# Patient Record
Sex: Female | Born: 1955 | Race: White | Hispanic: No | Smoking: Never smoker
Health system: Southern US, Community
[De-identification: ages and names within clinical notes are randomized; demographics above are authoritative.]

## PROBLEM LIST (undated history)

## (undated) DIAGNOSIS — D649 Anemia, unspecified: Secondary | ICD-10-CM

## (undated) DIAGNOSIS — M2550 Pain in unspecified joint: Secondary | ICD-10-CM

## (undated) DIAGNOSIS — E559 Vitamin D deficiency, unspecified: Secondary | ICD-10-CM

## (undated) DIAGNOSIS — Z923 Personal history of irradiation: Secondary | ICD-10-CM

## (undated) DIAGNOSIS — E785 Hyperlipidemia, unspecified: Secondary | ICD-10-CM

## (undated) DIAGNOSIS — F419 Anxiety disorder, unspecified: Secondary | ICD-10-CM

## (undated) HISTORY — DX: Pain in unspecified joint: M25.50

## (undated) HISTORY — DX: Vitamin D deficiency, unspecified: E55.9

## (undated) HISTORY — PX: APPENDECTOMY: SHX54

## (undated) HISTORY — DX: Hyperlipidemia, unspecified: E78.5

## (undated) HISTORY — DX: Anemia, unspecified: D64.9

## (undated) HISTORY — PX: KIDNEY STONE SURGERY: SHX686

## (undated) HISTORY — PX: BREAST SURGERY: SHX581

---

## 2011-04-06 ENCOUNTER — Other Ambulatory Visit: Payer: Self-pay | Admitting: Obstetrics & Gynecology

## 2011-04-06 DIAGNOSIS — Z1231 Encounter for screening mammogram for malignant neoplasm of breast: Secondary | ICD-10-CM

## 2011-04-11 ENCOUNTER — Ambulatory Visit
Admission: RE | Admit: 2011-04-11 | Discharge: 2011-04-11 | Disposition: A | Discharge status: Home / Self Care | Payer: 59 | Source: Ambulatory Visit | Attending: Obstetrics & Gynecology | Admitting: Obstetrics & Gynecology

## 2011-04-11 DIAGNOSIS — Z1231 Encounter for screening mammogram for malignant neoplasm of breast: Secondary | ICD-10-CM

## 2012-05-15 ENCOUNTER — Other Ambulatory Visit: Payer: Self-pay | Admitting: Obstetrics & Gynecology

## 2012-05-15 DIAGNOSIS — Z1231 Encounter for screening mammogram for malignant neoplasm of breast: Secondary | ICD-10-CM

## 2012-05-29 ENCOUNTER — Ambulatory Visit
Admission: RE | Admit: 2012-05-29 | Discharge: 2012-05-29 | Disposition: A | Discharge status: Home / Self Care | Payer: 59 | Source: Ambulatory Visit | Attending: Obstetrics & Gynecology | Admitting: Obstetrics & Gynecology

## 2012-05-29 DIAGNOSIS — Z1231 Encounter for screening mammogram for malignant neoplasm of breast: Secondary | ICD-10-CM

## 2012-09-15 LAB — HM COLONOSCOPY: HM COLON: NORMAL

## 2013-05-28 ENCOUNTER — Other Ambulatory Visit: Payer: Self-pay | Admitting: Obstetrics & Gynecology

## 2013-05-28 DIAGNOSIS — Z1231 Encounter for screening mammogram for malignant neoplasm of breast: Secondary | ICD-10-CM

## 2013-06-10 ENCOUNTER — Ambulatory Visit
Admission: RE | Admit: 2013-06-10 | Discharge: 2013-06-10 | Disposition: A | Discharge status: Home / Self Care | Payer: 59 | Source: Ambulatory Visit | Attending: Obstetrics & Gynecology | Admitting: Obstetrics & Gynecology

## 2013-06-10 DIAGNOSIS — Z1231 Encounter for screening mammogram for malignant neoplasm of breast: Secondary | ICD-10-CM

## 2014-05-16 LAB — HM MAMMOGRAPHY: HM Mammogram: NORMAL

## 2014-07-16 LAB — HM PAP SMEAR

## 2014-07-21 ENCOUNTER — Other Ambulatory Visit: Payer: Self-pay

## 2014-07-21 DIAGNOSIS — Z1239 Encounter for other screening for malignant neoplasm of breast: Secondary | ICD-10-CM

## 2014-08-03 ENCOUNTER — Ambulatory Visit
Admission: RE | Admit: 2014-08-03 | Discharge: 2014-08-03 | Disposition: A | Discharge status: Home / Self Care | Payer: 59 | Source: Ambulatory Visit

## 2014-08-03 ENCOUNTER — Encounter (INDEPENDENT_AMBULATORY_CARE_PROVIDER_SITE_OTHER): Payer: Self-pay

## 2014-08-03 DIAGNOSIS — Z1239 Encounter for other screening for malignant neoplasm of breast: Secondary | ICD-10-CM

## 2014-08-03 LAB — HM MAMMOGRAPHY

## 2014-09-15 ENCOUNTER — Encounter: Payer: Self-pay | Admitting: Internal Medicine

## 2014-09-15 ENCOUNTER — Ambulatory Visit (INDEPENDENT_AMBULATORY_CARE_PROVIDER_SITE_OTHER): Payer: 59 | Admitting: Internal Medicine

## 2014-09-15 VITALS — BP 157/93 | HR 83 | Temp 98.4°F | Ht 65.25 in | Wt 185.2 lb

## 2014-09-15 DIAGNOSIS — IMO0001 Reserved for inherently not codable concepts without codable children: Secondary | ICD-10-CM

## 2014-09-15 DIAGNOSIS — M25512 Pain in left shoulder: Secondary | ICD-10-CM

## 2014-09-15 DIAGNOSIS — R635 Abnormal weight gain: Secondary | ICD-10-CM

## 2014-09-15 DIAGNOSIS — R030 Elevated blood-pressure reading, without diagnosis of hypertension: Secondary | ICD-10-CM | POA: Insufficient documentation

## 2014-09-15 MED ORDER — IBUPROFEN 800 MG PO TABS: 800.0000 mg | ORAL_TABLET | Freq: Three times a day (TID) | ORAL | Status: DC | PRN

## 2014-09-15 NOTE — Progress Notes (Signed)
Subjective:    Patient ID: Lisa Gallegos, female    DOB: 12-07-1955, 58 y.o.   MRN: [id number]  HPI 58YO female presents to establish care.  Weight gain - 20lb increase since menopause. Walks several times per week and has counted calories with minimal improvement. Labs including thyroid function were recently checked in 01/2014. Frustrated by weight gain and inability to lose weight.  BP - typically elevated during doctor visits, however at home 120/80s. No chest pain, headache. Never taken meds for high BP.  Left shoulder pain - lateral left shoulder pain x 2 weeks, ever since tripping over her dog and falling on outstretched arm. Takes occasional Ibuprofen. Pain seems to be improving. No weakness or numbness noted.  Review of Systems  Constitutional: Negative for fever, chills, appetite change, fatigue and unexpected weight change.  Eyes: Negative for visual disturbance.  Respiratory: Negative for shortness of breath.   Cardiovascular: Negative for chest pain and leg swelling.  Gastrointestinal: Negative for nausea, vomiting, abdominal pain, diarrhea, constipation and rectal pain.  Musculoskeletal: Positive for myalgias and arthralgias. Negative for back pain, neck pain and neck stiffness.  Skin: Negative for color change and rash.  Hematological: Negative for adenopathy. Does not bruise/bleed easily.  Psychiatric/Behavioral: Negative for suicidal ideas, sleep disturbance and dysphoric mood. The patient is not nervous/anxious.        Objective:    BP 157/93 mmHg  Pulse 83  Temp(Src) 98.4 F (36.9 C) (Oral)  Ht 5' 5.25" (1.657 m)  Wt 185 lb 4 oz (84.029 kg)  BMI 30.60 kg/m2  SpO2 97% Physical Exam  Constitutional: She is oriented to person, place, and time. She appears well-developed and well-nourished. No distress.  HENT:  Head: Normocephalic and atraumatic.  Right Ear: External ear normal.  Left Ear: External ear normal.  Nose: Nose normal.  Mouth/Throat: Oropharynx is  clear and moist. No oropharyngeal exudate.  Eyes: Conjunctivae are normal. Pupils are equal, round, and reactive to light. Right eye exhibits no discharge. Left eye exhibits no discharge. No scleral icterus.  Neck: Normal range of motion. Neck supple. No tracheal deviation present. No thyromegaly present.  Cardiovascular: Normal rate, regular rhythm, normal heart sounds and intact distal pulses.  Exam reveals no gallop and no friction rub.   No murmur heard. Pulmonary/Chest: Effort normal and breath sounds normal. No accessory muscle usage. No tachypnea. No respiratory distress. She has no decreased breath sounds. She has no wheezes. She has no rhonchi. She has no rales. She exhibits no tenderness.  Musculoskeletal: Normal range of motion. She exhibits no edema or tenderness.       Left shoulder: She exhibits pain (left lateral shoulder, mild). She exhibits normal range of motion, no tenderness, no bony tenderness, no swelling, no deformity and normal strength.  Lymphadenopathy:    She has no cervical adenopathy.  Neurological: She is alert and oriented to person, place, and time. No cranial nerve deficit. She exhibits normal muscle tone. Coordination normal.  Skin: Skin is warm and dry. No rash noted. She is not diaphoretic. No erythema. No pallor.  Psychiatric: She has a normal mood and affect. Her behavior is normal. Judgment and thought content normal.          Assessment & Plan:   Problem List Items Addressed This Visit      Unprioritized   Elevated BP    BP Readings from Last 3 Encounters:  09/15/14 157/93   BP elevated today, however pt reports better controlled at home. Will  check renal function with labs. She will monitor BP at home and email with readings. Follow up in 4 weeks.    Left shoulder pain    Left shoulder pain after recent fall. Symptoms and exam most consistent with muscular strain. Doubt rotator cuff injury given lack of weakness of abduction on exam. Symptoms  are improving. Will continue Ibuprofen 800mg  po tid prn. Continue topical Biofreeze or similar and icing of shoulder. Follow up if symptoms are not improving. If no improvement, discussed referral to sports med.    Weight gain - Primary    Body mass index is 30.6 kg/(m^2). Wt Readings from Last 3 Encounters:  09/15/14 185 lb 4 oz (84.029 kg)   Recent weight gain noted by pt. No improvement with changes in diet and exercise. Will check thyroid function with labs. Discussed Mediterranean style diet and goal exercise of 62min 3x per week. Follow up in 4 weeks.    Relevant Orders      T4, free      TSH      Hemoglobin A1c      CBC with Differential      Comprehensive metabolic panel      Lipid panel      Microalbumin / creatinine urine ratio      Vit D  25 hydroxy (rtn osteoporosis monitoring)       Return in about 4 weeks (around 10/13/2014) for Recheck.

## 2014-09-15 NOTE — Assessment & Plan Note (Signed)
Body mass index is 30.6 kg/(m^2). Wt Readings from Last 3 Encounters:  09/15/14 185 lb 4 oz (84.029 kg)   Recent weight gain noted by pt. No improvement with changes in diet and exercise. Will check thyroid function with labs. Discussed Mediterranean style diet and goal exercise of 30min 3x per week. Follow up in 4 weeks.

## 2014-09-15 NOTE — Assessment & Plan Note (Signed)
Left shoulder pain after recent fall. Symptoms and exam most consistent with muscular strain. Doubt rotator cuff injury given lack of weakness of abduction on exam. Symptoms are improving. Will continue Ibuprofen 800mg  po tid prn. Continue topical Biofreeze or similar and icing of shoulder. Follow up if symptoms are not improving. If no improvement, discussed referral to sports med.

## 2014-09-15 NOTE — Assessment & Plan Note (Signed)
BP Readings from Last 3 Encounters:  09/15/14 157/93   BP elevated today, however pt reports better controlled at home. Will check renal function with labs. She will monitor BP at home and email with readings. Follow up in 4 weeks.

## 2014-09-15 NOTE — Progress Notes (Signed)
Pre visit review using our clinic review tool, if applicable. No additional management support is needed unless otherwise documented below in the visit note. 

## 2014-09-15 NOTE — Patient Instructions (Signed)
Monitor Blood Pressure 2-3 times weekly and email with readings.  Continue Ibuprofen 800mg  up to three times daily for left shoulder pain.  Labs today.  Follow up 4 weeks.

## 2014-09-16 ENCOUNTER — Encounter: Payer: Self-pay | Admitting: Internal Medicine

## 2014-09-16 LAB — COMPREHENSIVE METABOLIC PANEL
ALBUMIN: 4.7 g/dL (ref 3.5–5.2)
ALK PHOS: 88 U/L (ref 39–117)
ALT: 26 U/L (ref 0–35)
AST: 26 U/L (ref 0–37)
BUN: 24 mg/dL — ABNORMAL HIGH (ref 6–23)
CO2: 25 meq/L (ref 19–32)
Calcium: 9.7 mg/dL (ref 8.4–10.5)
Chloride: 102 mEq/L (ref 96–112)
Creatinine, Ser: 1 mg/dL (ref 0.4–1.2)
GFR: 64.48 mL/min (ref >60.00)
Glucose, Bld: 86 mg/dL (ref 70–99)
POTASSIUM: 4 meq/L (ref 3.5–5.1)
SODIUM: 136 meq/L (ref 135–145)
TOTAL PROTEIN: 7.5 g/dL (ref 6.0–8.3)
Total Bilirubin: 0.5 mg/dL (ref 0.2–1.2)

## 2014-09-16 LAB — CBC WITH DIFFERENTIAL/PLATELET
BASOS ABS: 0.1 10*3/uL (ref 0.0–0.1)
Basophils Relative: 0.6 % (ref 0.0–3.0)
Eosinophils Absolute: 0.3 10*3/uL (ref 0.0–0.7)
Eosinophils Relative: 2.7 % (ref 0.0–5.0)
HEMATOCRIT: 41.6 % (ref 36.0–46.0)
Hemoglobin: 14 g/dL (ref 12.0–15.0)
LYMPHS ABS: 3.3 10*3/uL (ref 0.7–4.0)
Lymphocytes Relative: 35 % (ref 12.0–46.0)
MCHC: 33.5 g/dL (ref 30.0–36.0)
MCV: 87.3 fl (ref 78.0–100.0)
MONOS PCT: 6.8 % (ref 3.0–12.0)
Monocytes Absolute: 0.6 10*3/uL (ref 0.1–1.0)
NEUTROS PCT: 54.9 % (ref 43.0–77.0)
Neutro Abs: 5.1 10*3/uL (ref 1.4–7.7)
PLATELETS: 257 10*3/uL (ref 150.0–400.0)
RBC: 4.77 Mil/uL (ref 3.87–5.11)
RDW: 13.4 % (ref 11.5–15.5)
WBC: 9.3 10*3/uL (ref 4.0–10.5)

## 2014-09-16 LAB — LIPID PANEL
Cholesterol: 294 mg/dL — ABNORMAL HIGH (ref 0–200)
HDL: 50.4 mg/dL (ref >39.00)
LDL CALC: 205 mg/dL — AB (ref 0–99)
NONHDL: 243.6
Total CHOL/HDL Ratio: 6
Triglycerides: 193 mg/dL — ABNORMAL HIGH (ref 0.0–149.0)
VLDL: 38.6 mg/dL (ref 0.0–40.0)

## 2014-09-16 LAB — MICROALBUMIN / CREATININE URINE RATIO
Creatinine,U: 87.7 mg/dL
MICROALB UR: 0.7 mg/dL (ref 0.0–1.9)
Microalb Creat Ratio: 0.8 mg/g (ref 0.0–30.0)

## 2014-09-16 LAB — HEMOGLOBIN A1C: Hgb A1c MFr Bld: 5.8 % (ref 4.6–6.5)

## 2014-09-16 LAB — VITAMIN D 25 HYDROXY (VIT D DEFICIENCY, FRACTURES): VITD: 25.94 ng/mL — ABNORMAL LOW (ref 30.00–100.00)

## 2014-09-16 LAB — TSH: TSH: 1.86 u[IU]/mL (ref 0.35–4.50)

## 2014-09-16 LAB — T4, FREE: Free T4: 0.89 ng/dL (ref 0.60–1.60)

## 2014-09-24 ENCOUNTER — Other Ambulatory Visit: Payer: Self-pay | Admitting: *Deleted

## 2014-09-24 ENCOUNTER — Telehealth: Payer: Self-pay | Admitting: *Deleted

## 2014-09-24 DIAGNOSIS — E785 Hyperlipidemia, unspecified: Secondary | ICD-10-CM

## 2014-09-24 NOTE — Telephone Encounter (Signed)
Pt coming tomorrow what labs and dx? 

## 2014-09-24 NOTE — Telephone Encounter (Signed)
CMP and lipid for hyperlipidemia

## 2014-09-25 ENCOUNTER — Other Ambulatory Visit (INDEPENDENT_AMBULATORY_CARE_PROVIDER_SITE_OTHER): Payer: 59

## 2014-09-25 DIAGNOSIS — E785 Hyperlipidemia, unspecified: Secondary | ICD-10-CM

## 2014-09-25 LAB — COMPREHENSIVE METABOLIC PANEL
ALT: 21 U/L (ref 0–35)
AST: 22 U/L (ref 0–37)
Albumin: 4.3 g/dL (ref 3.5–5.2)
Alkaline Phosphatase: 87 U/L (ref 39–117)
BILIRUBIN TOTAL: 0.6 mg/dL (ref 0.2–1.2)
BUN: 21 mg/dL (ref 6–23)
CO2: 27 meq/L (ref 19–32)
CREATININE: 0.9 mg/dL (ref 0.4–1.2)
Calcium: 9.8 mg/dL (ref 8.4–10.5)
Chloride: 103 mEq/L (ref 96–112)
GFR: 73.31 mL/min (ref >60.00)
GLUCOSE: 100 mg/dL — AB (ref 70–99)
Potassium: 4.4 mEq/L (ref 3.5–5.1)
Sodium: 136 mEq/L (ref 135–145)
TOTAL PROTEIN: 7.1 g/dL (ref 6.0–8.3)

## 2014-09-25 LAB — LIPID PANEL
Cholesterol: 237 mg/dL — ABNORMAL HIGH (ref 0–200)
HDL: 52.9 mg/dL (ref >39.00)
LDL Cholesterol: 164 mg/dL — ABNORMAL HIGH (ref 0–99)
NonHDL: 184.1
Total CHOL/HDL Ratio: 4
Triglycerides: 102 mg/dL (ref 0.0–149.0)
VLDL: 20.4 mg/dL (ref 0.0–40.0)

## 2014-09-25 NOTE — Addendum Note (Signed)
Addended by: Johnsie Cancel on: 09/25/2014 08:25 AM   Modules accepted: Orders

## 2015-03-01 ENCOUNTER — Encounter: Payer: Self-pay | Admitting: Internal Medicine

## 2015-03-24 ENCOUNTER — Encounter: Payer: Self-pay | Admitting: Internal Medicine

## 2015-03-24 ENCOUNTER — Ambulatory Visit (INDEPENDENT_AMBULATORY_CARE_PROVIDER_SITE_OTHER): Payer: 59 | Admitting: Internal Medicine

## 2015-03-24 VITALS — BP 158/80 | HR 77 | Temp 97.9°F | Ht 65.25 in | Wt 181.2 lb

## 2015-03-24 DIAGNOSIS — N951 Menopausal and female climacteric states: Secondary | ICD-10-CM | POA: Insufficient documentation

## 2015-03-24 DIAGNOSIS — R635 Abnormal weight gain: Secondary | ICD-10-CM

## 2015-03-24 DIAGNOSIS — IMO0001 Reserved for inherently not codable concepts without codable children: Secondary | ICD-10-CM

## 2015-03-24 DIAGNOSIS — L821 Other seborrheic keratosis: Secondary | ICD-10-CM | POA: Insufficient documentation

## 2015-03-24 DIAGNOSIS — R030 Elevated blood-pressure reading, without diagnosis of hypertension: Secondary | ICD-10-CM

## 2015-03-24 DIAGNOSIS — E785 Hyperlipidemia, unspecified: Secondary | ICD-10-CM | POA: Diagnosis not present

## 2015-03-24 MED ORDER — IBUPROFEN 800 MG PO TABS: 800.0000 mg | ORAL_TABLET | Freq: Three times a day (TID) | ORAL | Status: DC | PRN

## 2015-03-24 NOTE — Assessment & Plan Note (Signed)
Will recheck lipids fasting next week.

## 2015-03-24 NOTE — Assessment & Plan Note (Signed)
Discussed benign nature of SK. Will monitor for any changes.

## 2015-03-24 NOTE — Assessment & Plan Note (Signed)
Wt Readings from Last 3 Encounters:  03/24/15 181 lb 4 oz (82.214 kg)  09/15/14 185 lb 4 oz (84.029 kg)   Congratulated her on weight loss. Encouraged her to add higher intensity exercise.

## 2015-03-24 NOTE — Progress Notes (Signed)
Pre visit review using our clinic review tool, if applicable. No additional management support is needed unless otherwise documented below in the visit note. 

## 2015-03-24 NOTE — Assessment & Plan Note (Signed)
BP Readings from Last 3 Encounters:  03/24/15 158/80  09/15/14 157/93   BP continues to be elevated. She prefers not to add medication. Reports BP better controlled at home. Follow up in 6 months and prn.

## 2015-03-24 NOTE — Assessment & Plan Note (Signed)
Discussed pros and cons of hormone supplementation. Given that her symptoms are mild, recommended holding off for now. Recommended more regular use of topical estrace to help with vaginal dryness and potentially hot flashes. Follow up prn.

## 2015-03-24 NOTE — Progress Notes (Signed)
Subjective:    Patient ID: Lisa Gallegos, female    DOB: 1956/07/28, 59 y.o.   MRN: [id number]  HPI  59YO female presents for acute visit.  Having occasional hot flashes. Questions if she is in menopause. Has not had a period in 2 years. Was using Estrace regularly, until recently, only rarely uses. Notes some vaginal dryness. Questions if she needs hormone replacement.  Also concerned about left ear pain over the last few days. No fever, chills, congestion. Pain comes and goes. No drainage.  Frustrated by inability to lose weight. Following a healthy diet. Does yoga. Wt Readings from Last 3 Encounters:  03/24/15 181 lb 4 oz (82.214 kg)  09/15/14 185 lb 4 oz (84.029 kg)   Also concerned about itching area on mid back. Started using Eucerin with some improvement.   Past medical, surgical, family and social history per today's encounter.  Review of Systems  Constitutional: Positive for diaphoresis. Negative for fever, chills, appetite change, fatigue and unexpected weight change.  HENT: Positive for ear pain. Negative for congestion and ear discharge.   Eyes: Negative for visual disturbance.  Respiratory: Negative for shortness of breath.   Cardiovascular: Negative for chest pain and leg swelling.  Gastrointestinal: Negative for nausea, vomiting, abdominal pain, diarrhea and constipation.  Endocrine: Positive for heat intolerance.  Genitourinary: Positive for dyspareunia. Negative for vaginal bleeding and vaginal discharge.  Musculoskeletal: Negative for myalgias and arthralgias.  Skin: Negative for color change and rash.  Hematological: Negative for adenopathy. Does not bruise/bleed easily.  Psychiatric/Behavioral: Negative for suicidal ideas and dysphoric mood. The patient is not nervous/anxious.        Objective:    BP 158/80 mmHg  Pulse 77  Temp(Src) 97.9 F (36.6 C) (Oral)  Ht 5' 5.25" (1.657 m)  Wt 181 lb 4 oz (82.214 kg)  BMI 29.94 kg/m2  SpO2 98% Physical Exam    Constitutional: She is oriented to person, place, and time. She appears well-developed and well-nourished. No distress.  HENT:  Head: Normocephalic and atraumatic.  Right Ear: External ear normal.  Left Ear: External ear normal.  Nose: Nose normal.  Mouth/Throat: Oropharynx is clear and moist. No oropharyngeal exudate.  Eyes: Conjunctivae are normal. Pupils are equal, round, and reactive to light. Right eye exhibits no discharge. Left eye exhibits no discharge. No scleral icterus.  Neck: Normal range of motion. Neck supple. No tracheal deviation present. No thyromegaly present.  Cardiovascular: Normal rate, regular rhythm, normal heart sounds and intact distal pulses.  Exam reveals no gallop and no friction rub.   No murmur heard. Pulmonary/Chest: Effort normal and breath sounds normal. No respiratory distress. She has no wheezes. She has no rales. She exhibits no tenderness.  Musculoskeletal: Normal range of motion. She exhibits no edema or tenderness.  Lymphadenopathy:    She has no cervical adenopathy.  Neurological: She is alert and oriented to person, place, and time. No cranial nerve deficit. She exhibits normal muscle tone. Coordination normal.  Skin: Skin is warm and dry. No rash noted. She is not diaphoretic. No erythema. No pallor.     Psychiatric: She has a normal mood and affect. Her behavior is normal. Judgment and thought content normal.          Assessment & Plan:   Problem List Items Addressed This Visit      Unprioritized   Elevated BP    BP Readings from Last 3 Encounters:  03/24/15 158/80  09/15/14 157/93   BP continues to be  elevated. She prefers not to add medication. Reports BP better controlled at home. Follow up in 6 months and prn.      Hyperlipidemia    Will recheck lipids fasting next week.      Relevant Orders   Comprehensive metabolic panel   Lipid panel   Menopausal symptoms - Primary    Discussed pros and cons of hormone supplementation.  Given that her symptoms are mild, recommended holding off for now. Recommended more regular use of topical estrace to help with vaginal dryness and potentially hot flashes. Follow up prn.      Seborrheic keratoses    Discussed benign nature of SK. Will monitor for any changes.      Weight gain    Wt Readings from Last 3 Encounters:  03/24/15 181 lb 4 oz (82.214 kg)  09/15/14 185 lb 4 oz (84.029 kg)   Congratulated her on weight loss. Encouraged her to add higher intensity exercise.          Return in about 6 months (around 09/23/2015) for Recheck.

## 2015-03-24 NOTE — Patient Instructions (Signed)
Labs Monday.  Consider reading the book "Always Hungry" by Dr. Isabella Stalling.  Follow up in 49months.

## 2015-03-25 ENCOUNTER — Ambulatory Visit: Payer: Self-pay | Admitting: Internal Medicine

## 2015-03-29 ENCOUNTER — Other Ambulatory Visit: Payer: Self-pay

## 2015-04-02 ENCOUNTER — Telehealth: Payer: Self-pay | Admitting: *Deleted

## 2015-04-02 ENCOUNTER — Other Ambulatory Visit (INDEPENDENT_AMBULATORY_CARE_PROVIDER_SITE_OTHER): Payer: 59

## 2015-04-02 DIAGNOSIS — E785 Hyperlipidemia, unspecified: Secondary | ICD-10-CM

## 2015-04-02 LAB — LIPID PANEL
CHOLESTEROL: 231 mg/dL — AB (ref 0–200)
HDL: 54.8 mg/dL (ref >39.00)
LDL CALC: 158 mg/dL — AB (ref 0–99)
NonHDL: 176.2
TRIGLYCERIDES: 91 mg/dL (ref 0.0–149.0)
Total CHOL/HDL Ratio: 4
VLDL: 18.2 mg/dL (ref 0.0–40.0)

## 2015-04-02 LAB — COMPREHENSIVE METABOLIC PANEL
ALK PHOS: 89 U/L (ref 39–117)
ALT: 14 U/L (ref 0–35)
AST: 17 U/L (ref 0–37)
Albumin: 4.3 g/dL (ref 3.5–5.2)
BUN: 20 mg/dL (ref 6–23)
CO2: 30 mEq/L (ref 19–32)
Calcium: 9.4 mg/dL (ref 8.4–10.5)
Chloride: 103 mEq/L (ref 96–112)
Creatinine, Ser: 0.89 mg/dL (ref 0.40–1.20)
GFR: 69.39 mL/min (ref >60.00)
Glucose, Bld: 97 mg/dL (ref 70–99)
POTASSIUM: 4.2 meq/L (ref 3.5–5.1)
SODIUM: 137 meq/L (ref 135–145)
TOTAL PROTEIN: 7.1 g/dL (ref 6.0–8.3)
Total Bilirubin: 0.4 mg/dL (ref 0.2–1.2)

## 2015-04-02 NOTE — Telephone Encounter (Signed)
Pt would like to add tsh

## 2015-04-02 NOTE — Telephone Encounter (Signed)
Fine

## 2015-04-05 NOTE — Telephone Encounter (Signed)
Called lab there wasn't enough to due add on

## 2015-05-05 ENCOUNTER — Encounter: Payer: Self-pay | Admitting: Internal Medicine

## 2015-06-07 ENCOUNTER — Telehealth: Payer: Self-pay | Admitting: Internal Medicine

## 2015-06-07 DIAGNOSIS — R5382 Chronic fatigue, unspecified: Secondary | ICD-10-CM

## 2015-06-07 NOTE — Telephone Encounter (Signed)
Orders placed.

## 2015-06-07 NOTE — Telephone Encounter (Signed)
Pt called about scheduling an appt for lab work (Thyroid and B12 levels) no orders showing. Thank You!

## 2015-06-10 ENCOUNTER — Other Ambulatory Visit (INDEPENDENT_AMBULATORY_CARE_PROVIDER_SITE_OTHER): Payer: 59

## 2015-06-10 ENCOUNTER — Telehealth: Payer: Self-pay | Admitting: *Deleted

## 2015-06-10 DIAGNOSIS — R635 Abnormal weight gain: Secondary | ICD-10-CM | POA: Diagnosis not present

## 2015-06-10 NOTE — Telephone Encounter (Signed)
Labs and dx?  

## 2015-06-10 NOTE — Telephone Encounter (Signed)
I think labs are in TSH and B12

## 2015-06-11 LAB — VITAMIN B12: VITAMIN B 12: 557 pg/mL (ref 211–911)

## 2015-06-11 LAB — TSH: TSH: 1.56 u[IU]/mL (ref 0.35–4.50)

## 2015-08-12 ENCOUNTER — Other Ambulatory Visit: Payer: Self-pay

## 2015-08-12 DIAGNOSIS — Z1231 Encounter for screening mammogram for malignant neoplasm of breast: Secondary | ICD-10-CM

## 2015-08-17 ENCOUNTER — Ambulatory Visit
Admission: RE | Admit: 2015-08-17 | Discharge: 2015-08-17 | Disposition: A | Discharge status: Home / Self Care | Payer: Managed Care, Other (non HMO) | Source: Ambulatory Visit

## 2015-08-17 DIAGNOSIS — Z1231 Encounter for screening mammogram for malignant neoplasm of breast: Secondary | ICD-10-CM

## 2015-11-29 ENCOUNTER — Encounter: Payer: Self-pay | Admitting: Internal Medicine

## 2015-11-29 ENCOUNTER — Ambulatory Visit (INDEPENDENT_AMBULATORY_CARE_PROVIDER_SITE_OTHER): Payer: Managed Care, Other (non HMO) | Admitting: Internal Medicine

## 2015-11-29 VITALS — BP 160/96 | HR 99 | Temp 101.9°F | Wt 185.0 lb

## 2015-11-29 DIAGNOSIS — J111 Influenza due to unidentified influenza virus with other respiratory manifestations: Secondary | ICD-10-CM

## 2015-11-29 DIAGNOSIS — R509 Fever, unspecified: Secondary | ICD-10-CM | POA: Diagnosis not present

## 2015-11-29 LAB — POCT INFLUENZA A/B
INFLUENZA A, POC: NEGATIVE
INFLUENZA B, POC: NEGATIVE

## 2015-11-29 MED ORDER — OSELTAMIVIR PHOSPHATE 75 MG PO CAPS: 75.0000 mg | ORAL_CAPSULE | Freq: Two times a day (BID) | ORAL | Status: DC

## 2015-11-29 NOTE — Progress Notes (Signed)
Subjective:    Patient ID: Lisa Gallegos, female    DOB: 1955-10-19, 60 y.o.   MRN: [id number]  HPI  59YO female presents for acute visit.  Several relatives with flu, including daughter in law and 67 month old grandchild. Nasal congestion, muscle aches, feels "spacey." Fever at home. Chills. Occasional dry cough. Took a decongestant this morning with minimal improvement.   Wt Readings from Last 3 Encounters:  11/29/15 185 lb (83.915 kg)  03/24/15 181 lb 4 oz (82.214 kg)  09/15/14 185 lb 4 oz (84.029 kg)   BP Readings from Last 3 Encounters:  11/29/15 160/96  03/24/15 158/80  09/15/14 157/93    No past medical history on file. Family History  Problem Relation Age of Onset  . Cancer Mother     Pancreatic cancer   . Heart disease Father    Past Surgical History  Procedure Laterality Date  . Kidney stone surgery      laser surgery  . Cesarean section  6    FTP   Social History   Social History  . Marital Status: Single    Spouse Name: N/A  . Number of Children: N/A  . Years of Education: N/A   Social History Main Topics  . Smoking status: Never Smoker   . Smokeless tobacco: None  . Alcohol Use: 0.0 oz/week    0 Standard drinks or equivalent per week     Comment: couple drinks a week   . Drug Use: No  . Sexual Activity: Not Asked   Other Topics Concern  . None   Social History Narrative   Lives in [place] with fiance. 2 dogs and 1 cat in home.      Work - Community education officer.      Diet - chicken and fish with veggies, avoids red meat          Review of Systems  Constitutional: Positive for fever, chills and fatigue. Negative for unexpected weight change.  HENT: Positive for congestion, postnasal drip, rhinorrhea and sore throat. Negative for ear discharge, ear pain, facial swelling, hearing loss, mouth sores, nosebleeds, sinus pressure, sneezing, tinnitus, trouble swallowing and voice change.   Eyes: Negative for pain, discharge, redness and visual  disturbance.  Respiratory: Positive for cough. Negative for chest tightness, shortness of breath, wheezing and stridor.   Cardiovascular: Negative for chest pain, palpitations and leg swelling.  Gastrointestinal: Negative for nausea, vomiting, diarrhea and constipation.  Musculoskeletal: Positive for myalgias. Negative for arthralgias, neck pain and neck stiffness.  Skin: Negative for color change and rash.  Neurological: Negative for dizziness, weakness, light-headedness and headaches.  Hematological: Negative for adenopathy.       Objective:    BP 160/96 mmHg  Pulse 99  Temp(Src) 101.9 F (38.8 C) (Oral)  Wt 185 lb (83.915 kg)  SpO2 97% Physical Exam  Constitutional: She is oriented to person, place, and time. She appears well-developed and well-nourished. No distress.  HENT:  Head: Normocephalic and atraumatic.  Right Ear: External ear normal.  Left Ear: External ear normal.  Nose: Nose normal.  Mouth/Throat: Oropharynx is clear and moist. No oropharyngeal exudate.  Eyes: Conjunctivae are normal. Pupils are equal, round, and reactive to light. Right eye exhibits no discharge. Left eye exhibits no discharge. No scleral icterus.  Neck: Normal range of motion. Neck supple. No tracheal deviation present. No thyromegaly present.  Cardiovascular: Normal rate, regular rhythm, normal heart sounds and intact distal pulses.  Exam reveals no gallop and no friction rub.  No murmur heard. Pulmonary/Chest: Effort normal and breath sounds normal. No accessory muscle usage. No tachypnea. No respiratory distress. She has no decreased breath sounds. She has no wheezes. She has no rhonchi. She has no rales. She exhibits no tenderness.  Musculoskeletal: Normal range of motion. She exhibits no edema or tenderness.  Lymphadenopathy:    She has no cervical adenopathy.  Neurological: She is alert and oriented to person, place, and time. No cranial nerve deficit. She exhibits normal muscle tone.  Coordination normal.  Skin: Skin is warm and dry. No rash noted. She is not diaphoretic. No erythema. No pallor.  Psychiatric: She has a normal mood and affect. Her behavior is normal. Judgment and thought content normal.          Assessment & Plan:   Problem List Items Addressed This Visit      Unprioritized   Influenza with respiratory manifestation - Primary    Symptoms and exam c/w influenza. Start Tamiflu bid x5 days. Rest, increase fluids. Follow up if symptoms not improving.      Relevant Medications   oseltamivir (TAMIFLU) 75 MG capsule       Return if symptoms worsen or fail to improve.

## 2015-11-29 NOTE — Addendum Note (Signed)
Addended by: Vernetta Honey on: 11/29/2015 01:29 PM   Modules accepted: Orders

## 2015-11-29 NOTE — Patient Instructions (Signed)

## 2015-11-29 NOTE — Progress Notes (Signed)
Pre visit review using our clinic review tool, if applicable. No additional management support is needed unless otherwise documented below in the visit note. 

## 2015-11-29 NOTE — Assessment & Plan Note (Signed)
Symptoms and exam c/w influenza. Start Tamiflu bid x5 days. Rest, increase fluids. Follow up if symptoms not improving.

## 2015-11-30 ENCOUNTER — Encounter: Payer: Self-pay | Admitting: Internal Medicine

## 2016-01-11 ENCOUNTER — Encounter: Payer: Self-pay | Admitting: Internal Medicine

## 2016-01-12 ENCOUNTER — Encounter: Payer: Self-pay | Admitting: Family Medicine

## 2016-01-12 ENCOUNTER — Ambulatory Visit (INDEPENDENT_AMBULATORY_CARE_PROVIDER_SITE_OTHER): Payer: Managed Care, Other (non HMO) | Admitting: Family Medicine

## 2016-01-12 VITALS — BP 136/88 | HR 83 | Temp 98.4°F | Ht 65.25 in | Wt 183.6 lb

## 2016-01-12 DIAGNOSIS — T148 Other injury of unspecified body region: Secondary | ICD-10-CM | POA: Diagnosis not present

## 2016-01-12 DIAGNOSIS — W57XXXA Bitten or stung by nonvenomous insect and other nonvenomous arthropods, initial encounter: Secondary | ICD-10-CM | POA: Diagnosis not present

## 2016-01-12 MED ORDER — DOXYCYCLINE HYCLATE 100 MG PO TABS: 200.0000 mg | ORAL_TABLET | Freq: Once | ORAL | Status: DC

## 2016-01-12 NOTE — Assessment & Plan Note (Signed)
Patient with recent tick bite with possible exposure for greater than 48 hours. Discussed potential for Lyme disease in our area as we're in the [place] and given her duration of possible tick exposure. Discussed potential prophylaxis against Lyme disease with doxycycline as a single dose. Discussed that per up-to-date there is no role for prophylaxis for RMSF given the low percentage of ticks that carry this disease. We will treat her with a prophylactic dose of doxycycline to help prevent Lyme disease. She will monitor for signs and symptoms of RMSF and seek medical attention immediately if they occur. Given return precautions.

## 2016-01-12 NOTE — Progress Notes (Signed)
Patient ID: Lisa Gallegos, female   DOB: 1956/08/11, 60 y.o.   MRN: [id number]  Tommi Rumps, MD Phone: [phone]  Lisa Gallegos is a 60 y.o. female who presents today for same-day visit.  Tick bite: Patient notes found to call on her left hip yesterday. She believes it may have been in place since Sunday when she was in a field taking pictures with her children. Notes she pulled it off. She's not had any rash. Notes it looks just like a bug bite now. Does not itch. She has no joint aches or fevers. She feels well overall. She is worried she might develop Lyme disease or [facility] spotted fever.  PMH: nonsmoker.   ROS see history of present illness  Objective  Physical Exam Filed Vitals:   01/12/16 1359  BP: 136/88  Pulse: 83  Temp: 98.4 F (36.9 C)    BP Readings from Last 3 Encounters:  01/12/16 136/88  11/29/15 160/96  03/24/15 158/80   Wt Readings from Last 3 Encounters:  01/12/16 183 lb 9.6 oz (83.28 kg)  11/29/15 185 lb (83.915 kg)  03/24/15 181 lb 4 oz (82.214 kg)    Physical Exam  Constitutional: She is well-developed, well-nourished, and in no distress.  Cardiovascular: Normal rate, regular rhythm and normal heart sounds.   Pulmonary/Chest: Effort normal and breath sounds normal.  Skin: Skin is warm. She is not diaphoretic.     No other rash noted     Assessment/Plan: Please see individual problem list.  Tick bite Patient with recent tick bite with possible exposure for greater than 48 hours. Discussed potential for Lyme disease in our area as we're in the [place] and given her duration of possible tick exposure. Discussed potential prophylaxis against Lyme disease with doxycycline as a single dose. Discussed that per up-to-date there is no role for prophylaxis for RMSF given the low percentage of ticks that carry this disease. We will treat her with a prophylactic dose of doxycycline to help prevent Lyme disease. She will monitor for signs  and symptoms of RMSF and seek medical attention immediately if they occur. Given return precautions.    No orders of the defined types were placed in this encounter.    Meds ordered this encounter  Medications  . DISCONTD: doxycycline (VIBRA-TABS) 100 MG tablet    Sig: Take 2 tablets (200 mg total) by mouth once.    Dispense:  2 tablet    Refill:  0  . doxycycline (VIBRA-TABS) 100 MG tablet    Sig: Take 2 tablets (200 mg total) by mouth once.    Dispense:  2 tablet    Refill:  0   Tommi Rumps, MD [place]

## 2016-01-12 NOTE — Patient Instructions (Signed)
Nice to meet you. We will treat you prophylactically for Lyme disease given the duration of tick exposure. You'll take doxycycline 200 mg once. If you develop fever, rash, headache, malaise, muscle aches, joint aches, nausea, vomiting, abdominal pain, confusion, seizures, or any new or changing symptoms please seek medical attention.

## 2016-01-12 NOTE — Progress Notes (Signed)
Pre visit review using our clinic review tool, if applicable. No additional management support is needed unless otherwise documented below in the visit note. 

## 2016-06-12 ENCOUNTER — Encounter: Payer: Self-pay | Admitting: Family

## 2016-06-12 ENCOUNTER — Ambulatory Visit (INDEPENDENT_AMBULATORY_CARE_PROVIDER_SITE_OTHER): Payer: Managed Care, Other (non HMO) | Admitting: Family

## 2016-06-12 VITALS — BP 138/82 | HR 78 | Temp 98.2°F | Wt 179.0 lb

## 2016-06-12 DIAGNOSIS — R21 Rash and other nonspecific skin eruption: Secondary | ICD-10-CM

## 2016-06-12 MED ORDER — MUPIROCIN 2 % EX OINT: 1.0000 "application " | TOPICAL_OINTMENT | Freq: Two times a day (BID) | CUTANEOUS | 0 refills | Status: DC

## 2016-06-12 NOTE — Patient Instructions (Addendum)
My pleasure meeting you today. Please schedule a visit for physical in the next couple months as well as to establish care.   Suspect a small boil in groin- next time apply antibiotic cream to prevent it from getting big and scarring.

## 2016-06-12 NOTE — Progress Notes (Signed)
Pre visit review using our clinic review tool, if applicable. No additional management support is needed unless otherwise documented below in the visit note. 

## 2016-06-12 NOTE — Progress Notes (Signed)
Subjective:    Patient ID: Lisa Gallegos, female    DOB: 1956-05-24, 60 y.o.   MRN: [id number]  CC: Lisa Gallegos is a 59 y.o. female who presents today for an acute visit.    HPI: Patient is here for assurance after picking at an ingrown hair after shaving in left groin over past few months, which is now 'bruised looking'. Unchanged. Fever, chills, lymphadenopathy, discharge from lesion. No vaginal pain. No h/o MRSA or cellulitis.  Follows with [facility] and has appointment for Pap smear and mammogram due this October 2017.  She will make CPE, establish care appointment for this fall as well with me.     HISTORY:  History reviewed. No pertinent past medical history. Past Surgical History:  Procedure Laterality Date  . CESAREAN SECTION  1985   FTP  . KIDNEY STONE SURGERY     laser surgery   Family History  Problem Relation Age of Onset  . Cancer Mother     Pancreatic cancer   . Heart disease Father     Allergies: Review of patient's allergies indicates no known allergies. Current Outpatient Prescriptions on File Prior to Visit  Medication Sig Dispense Refill  . Cholecalciferol (VITAMIN D-3 PO) Take by mouth.    . Coenzyme Q10 (COQ10 PO) Take by mouth.    Adora Fridge VAGINAL 0.1 MG/GM vaginal cream   11  . ibuprofen (ADVIL,MOTRIN) 800 MG tablet Take 1 tablet (800 mg total) by mouth every 8 (eight) hours as needed. 90 tablet 0  . MAGNESIUM PO Take by mouth.    . Multiple Vitamins-Minerals (MULTIVITAMIN PO) Take by mouth.     No current facility-administered medications on file prior to visit.     Social History  Substance Use Topics  . Smoking status: Never Smoker  . Smokeless tobacco: Never Used  . Alcohol use 0.0 oz/week     Comment: couple drinks a week     Review of Systems  Constitutional: Negative for chills and fever.  Respiratory: Negative for cough.   Cardiovascular: Negative for chest pain and palpitations.  Gastrointestinal: Negative for nausea and  vomiting.  Skin: Positive for rash.      Objective:    BP 138/82   Pulse 78   Temp 98.2 F (36.8 C) (Oral)   Wt 179 lb (81.2 kg)   SpO2 98%   BMI 29.56 kg/m    Physical Exam  Constitutional: She appears well-developed and well-nourished.  Eyes: Conjunctivae are normal.  Cardiovascular: Normal rate, regular rhythm, normal heart sounds and normal pulses.   Pulmonary/Chest: Effort normal and breath sounds normal. She has no wheezes. She has no rhonchi. She has no rales.  Neurological: She is alert.  Skin: Skin is warm and dry. Lesion noted.     Psychiatric: She has a normal mood and affect. Her speech is normal and behavior is normal. Thought content normal.  Vitals reviewed.      Assessment & Plan:  1. Rash and nonspecific skin eruption Lesion appears as hyperpigmented scar. No signs of acute infection. Gave patient topical antibiotic if another in grown hair presents to prevent future infection,scarring.  - mupirocin ointment (BACTROBAN) 2 %; Place 1 application into the nose 2 (two) times daily.  Dispense: 22 g; Refill: 0     I have discontinued [name]'s oseltamivir and doxycycline. I am also having her start on mupirocin ointment. Additionally, I am having her maintain her ESTRACE VAGINAL, Multiple Vitamins-Minerals (MULTIVITAMIN PO), MAGNESIUM PO, Coenzyme  Q10 (COQ10 PO), Cholecalciferol (VITAMIN D-3 PO), and ibuprofen.   Meds ordered this encounter  Medications  . mupirocin ointment (BACTROBAN) 2 %    Sig: Place 1 application into the nose 2 (two) times daily.    Dispense:  22 g    Refill:  0    Order Specific Question:   Supervising Provider    Answer:   Crecencio Mc [[id number]]    Return precautions given.   Risks, benefits, and alternatives of the medications and treatment plan prescribed today were discussed, and patient expressed understanding.   Education regarding symptom management and diagnosis given to patient on AVS.  Continue to follow with  Rica Mast, MD for routine health maintenance.   Lisa Gallegos and I agreed with plan.   Mable Paris, FNP

## 2016-08-14 ENCOUNTER — Other Ambulatory Visit: Payer: Self-pay | Admitting: Obstetrics & Gynecology

## 2016-08-14 DIAGNOSIS — Z1231 Encounter for screening mammogram for malignant neoplasm of breast: Secondary | ICD-10-CM

## 2016-09-11 ENCOUNTER — Ambulatory Visit
Admission: RE | Admit: 2016-09-11 | Discharge: 2016-09-11 | Disposition: A | Discharge status: Home / Self Care | Payer: Managed Care, Other (non HMO) | Source: Ambulatory Visit | Attending: Obstetrics & Gynecology | Admitting: Obstetrics & Gynecology

## 2016-09-11 DIAGNOSIS — Z1231 Encounter for screening mammogram for malignant neoplasm of breast: Secondary | ICD-10-CM

## 2016-10-24 DIAGNOSIS — H5203 Hypermetropia, bilateral: Secondary | ICD-10-CM | POA: Diagnosis not present

## 2016-10-24 DIAGNOSIS — H40053 Ocular hypertension, bilateral: Secondary | ICD-10-CM | POA: Diagnosis not present

## 2016-10-24 DIAGNOSIS — H2513 Age-related nuclear cataract, bilateral: Secondary | ICD-10-CM | POA: Diagnosis not present

## 2016-11-06 DIAGNOSIS — D229 Melanocytic nevi, unspecified: Secondary | ICD-10-CM | POA: Diagnosis not present

## 2016-11-06 DIAGNOSIS — D492 Neoplasm of unspecified behavior of bone, soft tissue, and skin: Secondary | ICD-10-CM | POA: Diagnosis not present

## 2016-11-06 DIAGNOSIS — L821 Other seborrheic keratosis: Secondary | ICD-10-CM | POA: Diagnosis not present

## 2016-11-06 DIAGNOSIS — D224 Melanocytic nevi of scalp and neck: Secondary | ICD-10-CM | POA: Diagnosis not present

## 2016-11-06 DIAGNOSIS — L820 Inflamed seborrheic keratosis: Secondary | ICD-10-CM | POA: Diagnosis not present

## 2016-11-29 DIAGNOSIS — F4322 Adjustment disorder with anxiety: Secondary | ICD-10-CM | POA: Diagnosis not present

## 2016-12-13 DIAGNOSIS — F4322 Adjustment disorder with anxiety: Secondary | ICD-10-CM | POA: Diagnosis not present

## 2016-12-27 ENCOUNTER — Ambulatory Visit (INDEPENDENT_AMBULATORY_CARE_PROVIDER_SITE_OTHER): Payer: BLUE CROSS/BLUE SHIELD | Admitting: Family

## 2016-12-27 ENCOUNTER — Encounter: Payer: Self-pay | Admitting: Family

## 2016-12-27 VITALS — BP 124/78 | HR 75 | Temp 98.7°F | Ht 65.25 in | Wt 183.0 lb

## 2016-12-27 DIAGNOSIS — F4322 Adjustment disorder with anxiety: Secondary | ICD-10-CM | POA: Diagnosis not present

## 2016-12-27 DIAGNOSIS — Z0000 Encounter for general adult medical examination without abnormal findings: Secondary | ICD-10-CM | POA: Diagnosis not present

## 2016-12-27 NOTE — Progress Notes (Signed)
Subjective:    Patient ID: Lisa Gallegos, female    DOB: [birth year], 61 y.o.   MRN: [id number]  CC: Lisa Gallegos is a 61 y.o. female who presents today for physical exam.  HPI: Feeling well, no complaints.   Father had cardiovascular disease and in doing some reading, she would like to have lipoprotein a included in her lab work today. She had a history of elevated cholesterol and has declined cholesterol medication the past.  Follows with Dr Lisbeth Renshaw, GYN for pap, mammogram. States last pap normal and UTD; no records of this. She has a breast exam with GYN.   Walking and  goes to [employer].  Mammogram and colonoscopy UTD- no polpys.  Nonsmoker Follows with dermatology and annual skin check 10/2016.             HISTORY:  History reviewed. No pertinent past medical history. Past Surgical History:  Procedure Laterality Date  . CESAREAN SECTION  1985   FTP  . KIDNEY STONE SURGERY     laser surgery   Family History  Problem Relation Age of Onset  . Cancer Mother     Pancreatic cancer   . Heart disease Father     Allergies: Patient has no known allergies. Current Outpatient Prescriptions on File Prior to Visit  Medication Sig Dispense Refill  . Cholecalciferol (VITAMIN D-3 PO) Take by mouth.    . Coenzyme Q10 (COQ10 PO) Take by mouth.    Adora Fridge VAGINAL 0.1 MG/GM vaginal cream   11  . ibuprofen (ADVIL,MOTRIN) 800 MG tablet Take 1 tablet (800 mg total) by mouth every 8 (eight) hours as needed. 90 tablet 0  . MAGNESIUM PO Take by mouth.    . Multiple Vitamins-Minerals (MULTIVITAMIN PO) Take by mouth.    . mupirocin ointment (BACTROBAN) 2 % Place 1 application into the nose 2 (two) times daily. 22 g 0   No current facility-administered medications on file prior to visit.     Social History  Substance Use Topics  . Smoking status: Never Smoker  . Smokeless tobacco: Never Used  . Alcohol use 0.0 oz/week     Comment: couple drinks a week     Review of Systems    Constitutional: Negative for chills, fever and unexpected weight change.  HENT: Negative for congestion.   Respiratory: Negative for cough.   Cardiovascular: Negative for chest pain, palpitations and leg swelling.  Gastrointestinal: Negative for nausea and vomiting.  Musculoskeletal: Negative for arthralgias and myalgias.  Skin: Negative for rash.  Neurological: Negative for headaches.  Hematological: Negative for adenopathy.  Psychiatric/Behavioral: Negative for confusion.      Objective:    BP 124/78   Pulse 75   Temp 98.7 F (37.1 C) (Oral)   Ht 5' 5.25" (1.657 m)   Wt 183 lb (83 kg)   SpO2 97%   BMI 30.22 kg/m  BP Readings from Last 3 Encounters:  12/27/16 124/78  06/12/16 138/82  01/12/16 136/88   Wt Readings from Last 3 Encounters:  12/27/16 183 lb (83 kg)  06/12/16 179 lb (81.2 kg)  01/12/16 183 lb 9.6 oz (83.3 kg)    Physical Exam  Constitutional: She appears well-developed and well-nourished.  Eyes: Conjunctivae are normal.  Neck: No thyroid mass and no thyromegaly present.  Cardiovascular: Normal rate, regular rhythm, normal heart sounds and normal pulses.   Pulmonary/Chest: Effort normal and breath sounds normal. She has no wheezes. She has no rhonchi. She has no rales.  Lymphadenopathy:  Head (right side): No submental, no submandibular, no tonsillar, no preauricular, no posterior auricular and no occipital adenopathy present.       Head (left side): No submental, no submandibular, no tonsillar, no preauricular, no posterior auricular and no occipital adenopathy present.    She has no cervical adenopathy.  Neurological: She is alert.  Skin: Skin is warm and dry.  Psychiatric: She has a normal mood and affect. Her speech is normal and behavior is normal. Thought content normal.  Vitals reviewed.      Assessment & Plan:   Problem List Items Addressed This Visit      Other   Routine physical examination - Primary    Patient follows with GYN,  Dr. Lisbeth Renshaw, and politely declines having a breast exam or pelvic exam today. Screening labs ordered and consented for HIV and hepatitis C. Additionally, added in lipoprotein a which patient requested. We discussed lab and patient is unsure even if elevated if she would consider cholesterol medication. She would just like to know with her family history. Mammogram and Pap smear up-to-date. Colonoscopy is up-to-date. Nonsmoker. Immunizations up-to-date. Patient does not take the flu vaccine. Encouraged continued exercise.      Relevant Orders   CBC with Differential/Platelet   Comprehensive metabolic panel   Hemoglobin A1c   Hepatitis C antibody   HIV antibody   Lipid panel   TSH   VITAMIN D 25 Hydroxy (Vit-D Deficiency, Fractures)   Lipoprotein A (LPA)       I am having Ms. Nordgren maintain her ESTRACE VAGINAL, Multiple Vitamins-Minerals (MULTIVITAMIN PO), MAGNESIUM PO, Coenzyme Q10 (COQ10 PO), Cholecalciferol (VITAMIN D-3 PO), ibuprofen, and mupirocin ointment.   No orders of the defined types were placed in this encounter.   Return precautions given.   Risks, benefits, and alternatives of the medications and treatment plan prescribed today were discussed, and patient expressed understanding.   Education regarding symptom management and diagnosis given to patient on AVS.  Continue to follow with Mable Paris, FNP for routine health maintenance.   Lisa Gallegos and I agreed with plan.   Mable Paris, FNP

## 2016-12-27 NOTE — Patient Instructions (Signed)
Fasting Lab appt.    This is  Dr. Lupita Dawn  example of a  "Low GI"  Diet:  It will allow you to lose 4 to 8  lbs  per month if you follow it carefully.  Your goal with exercise is a minimum of 30 minutes of aerobic exercise 5 days per week (Walking does not count once it becomes easy!)    All of the foods can be found at grocery stores and in bulk at [employer].  The Atkins protein bars and shakes are available in more varieties at [employer], [employer] and [place].     7 AM Breakfast:  Choose from the following:  Low carbohydrate Protein  Shakes (I recommend the  Premier Protein chocolate shakes,  EAS AdvantEdge "Carb Control" shakes  Or the Atkins shakes all are under 3 net carbs)     a scrambled egg/bacon/cheese burrito made with Mission's "carb balance" whole wheat tortilla  (about 10 net carbs )  Regulatory affairs officer (basically a quiche without the pastry crust) that is eaten cold and very convenient way to get your eggs.  8 carbs)  If you make your own protein shakes, avoid bananas and pineapple,  And use low carb greek yogurt or original /unsweetened almond or soy milk    Avoid cereal and bananas, oatmeal and cream of wheat and grits. They are loaded with carbohydrates!   10 AM: high protein snack:  Protein bar by Atkins (the snack size, under 200 cal, usually < 6 net carbs).    A stick of cheese:  Around 1 carb,  100 cal     Dannon Light n Fit [place] Yogurt  (80 cal, 8 carbs)  Other so called "protein bars" and Greek yogurts tend to be loaded with carbohydrates.  Remember, in food advertising, the word "energy" is synonymous for " carbohydrate."  Lunch:   A Sandwich using the bread choices listed, Can use any  Eggs,  lunchmeat, grilled meat or canned tuna), avocado, regular mayo/mustard  and cheese.  A Salad using blue cheese, ranch,  Goddess or vinagrette,  Avoid taco shells, croutons or "confetti" and no "candied nuts" but regular nuts OK.   No pretzels, nabs  or  chips.  Pickles and miniature sweet peppers are a good low carb alternative that provide a "crunch"  The bread is the only source of carbohydrate in a sandwich and  can be decreased by trying some of the attached alternatives to traditional loaf bread   Avoid "Low fat dressings, as well as [place] dressings They are loaded with sugar!   3 PM/ Mid day  Snack:  Consider  1 ounce of  almonds, walnuts, pistachios, pecans, peanuts,  Macadamia nuts or a nut medley.  Avoid "granola and granola bars "  Mixed nuts are ok in moderation as long as there are no raisins,  cranberries or dried fruit.   KIND bars are OK if you get the low glycemic index variety   Try the prosciutto/mozzarella cheese sticks by [employer]  In deli /backery section   High protein      6 PM  Dinner:     Meat/fowl/fish with a green salad, and either broccoli, cauliflower, green beans, spinach, brussel sprouts or  Lima beans. DO NOT BREAD THE PROTEIN!!      There is a low carb pasta by Dreamfield's that is acceptable and tastes great: only 5 digestible carbs/serving.( All grocery stores but BJs carry it )  Several ready made meals are available low carb:   Try Michel Angelo's chicken piccata or chicken or eggplant parm over low carb pasta.(Lowes and BJs)   Marjory Lies Sanchez's "Carnitas" (pulled pork, no sauce,  0 carbs) or his beef pot roast to make a dinner burrito (at [employer])  Pesto over low carb pasta (bj's sells a good quality pesto in the center refrigerated section of the deli   Try satueeing  Cheral Marker with mushroooms as a good side   [employer] makes a mashed cauliflower that tastes like mashed potatoes  Whole wheat pasta is still full of digestible carbs and  Not as low in glycemic index as Dreamfield's.   Brown rice is still rice,  So skip the rice and noodles if you eat [place] or [place] (or at least limit to 1/2 cup)  9 PM snack :   Breyer's "low carb" fudgsicle or  ice cream bar (Carb Smart line), or   Weight Watcher's ice cream bar , or another "no sugar added" ice cream;  a serving of fresh berries/cherries with whipped cream   Cheese or DANNON'S LlGHT N FIT GREEK YOGURT  8 ounces of [place] unsweetened almond/cococunut milk    Treat yourself to a parfait made with whipped cream blueberiies, walnuts and vanilla greek yogurt  Avoid bananas, pineapple, grapes  and watermelon on a regular basis because they are high in sugar.  THINK OF THEM AS DESSERT  Remember that snack Substitutions should be less than 10 NET carbs per serving and meals < 20 carbs. Remember to subtract fiber grams to get the "net carbs."  '@TULLOBREADPACKAGE' @   Health Maintenance for Postmenopausal Women Menopause is a normal process in which your reproductive ability comes to an end. This process happens gradually over a span of months to years, usually between the ages of 47 and 92. Menopause is complete when you have missed 12 consecutive menstrual periods. It is important to talk with your health care provider about some of the most common conditions that affect postmenopausal women, such as heart disease, cancer, and bone loss (osteoporosis). Adopting a healthy lifestyle and getting preventive care can help to promote your health and wellness. Those actions can also lower your chances of developing some of these common conditions. What should I know about menopause? During menopause, you may experience a number of symptoms, such as:  Moderate-to-severe hot flashes.  Night sweats.  Decrease in sex drive.  Mood swings.  Headaches.  Tiredness.  Irritability.  Memory problems.  Insomnia. Choosing to treat or not to treat menopausal changes is an individual decision that you make with your health care provider. What should I know about hormone replacement therapy and supplements? Hormone therapy products are effective for treating symptoms that are associated with menopause, such as hot flashes and night  sweats. Hormone replacement carries certain risks, especially as you become older. If you are thinking about using estrogen or estrogen with progestin treatments, discuss the benefits and risks with your health care provider. What should I know about heart disease and stroke? Heart disease, heart attack, and stroke become more likely as you age. This may be due, in part, to the hormonal changes that your body experiences during menopause. These can affect how your body processes dietary fats, triglycerides, and cholesterol. Heart attack and stroke are both medical emergencies. There are many things that you can do to help prevent heart disease and stroke:  Have your blood pressure checked at least every 1-2 years.  High blood pressure causes heart disease and increases the risk of stroke.  If you are 73-13 years old, ask your health care provider if you should take aspirin to prevent a heart attack or a stroke.  Do not use any tobacco products, including cigarettes, chewing tobacco, or electronic cigarettes. If you need help quitting, ask your health care provider.  It is important to eat a healthy diet and maintain a healthy weight.  Be sure to include plenty of vegetables, fruits, low-fat dairy products, and lean protein.  Avoid eating foods that are high in solid fats, added sugars, or salt (sodium).  Get regular exercise. This is one of the most important things that you can do for your health.  Try to exercise for at least 150 minutes each week. The type of exercise that you do should increase your heart rate and make you sweat. This is known as moderate-intensity exercise.  Try to do strengthening exercises at least twice each week. Do these in addition to the moderate-intensity exercise.  Know your numbers.Ask your health care provider to check your cholesterol and your blood glucose. Continue to have your blood tested as directed by your health care provider. What should I know about  cancer screening? There are several types of cancer. Take the following steps to reduce your risk and to catch any cancer development as early as possible. Breast Cancer  Practice breast self-awareness.  This means understanding how your breasts normally appear and feel.  It also means doing regular breast self-exams. Let your health care provider know about any changes, no matter how small.  If you are 58 or older, have a clinician do a breast exam (clinical breast exam or CBE) every year. Depending on your age, family history, and medical history, it may be recommended that you also have a yearly breast X-ray (mammogram).  If you have a family history of breast cancer, talk with your health care provider about genetic screening.  If you are at high risk for breast cancer, talk with your health care provider about having an MRI and a mammogram every year.  Breast cancer (BRCA) gene test is recommended for women who have family members with BRCA-related cancers. Results of the assessment will determine the need for genetic counseling and BRCA1 and for BRCA2 testing. BRCA-related cancers include these types:  Breast. This occurs in males or females.  Ovarian.  Tubal. This may also be called fallopian tube cancer.  Cancer of the abdominal or pelvic lining (peritoneal cancer).  Prostate.  Pancreatic. Cervical, Uterine, and Ovarian Cancer  Your health care provider may recommend that you be screened regularly for cancer of the pelvic organs. These include your ovaries, uterus, and vagina. This screening involves a pelvic exam, which includes checking for microscopic changes to the surface of your cervix (Pap test).  For women ages 21-65, health care providers may recommend a pelvic exam and a Pap test every three years. For women ages 38-65, they may recommend the Pap test and pelvic exam, combined with testing for human papilloma virus (HPV), every five years. Some types of HPV increase  your risk of cervical cancer. Testing for HPV may also be done on women of any age who have unclear Pap test results.  Other health care providers may not recommend any screening for nonpregnant women who are considered low risk for pelvic cancer and have no symptoms. Ask your health care provider if a screening pelvic exam is right for you.  If you  have had past treatment for cervical cancer or a condition that could lead to cancer, you need Pap tests and screening for cancer for at least 20 years after your treatment. If Pap tests have been discontinued for you, your risk factors (such as having a new sexual partner) need to be reassessed to determine if you should start having screenings again. Some women have medical problems that increase the chance of getting cervical cancer. In these cases, your health care provider may recommend that you have screening and Pap tests more often.  If you have a family history of uterine cancer or ovarian cancer, talk with your health care provider about genetic screening.  If you have vaginal bleeding after reaching menopause, tell your health care provider.  There are currently no reliable tests available to screen for ovarian cancer. Lung Cancer  Lung cancer screening is recommended for adults 93-67 years old who are at high risk for lung cancer because of a history of smoking. A yearly low-dose CT scan of the lungs is recommended if you:  Currently smoke.  Have a history of at least 30 pack-years of smoking and you currently smoke or have quit within the past 15 years. A pack-year is smoking an average of one pack of cigarettes per day for one year. Yearly screening should:  Continue until it has been 15 years since you quit.  Stop if you develop a health problem that would prevent you from having lung cancer treatment. Colorectal Cancer  This type of cancer can be detected and can often be prevented.  Routine colorectal cancer screening usually  begins at age 74 and continues through age 66.  If you have risk factors for colon cancer, your health care provider may recommend that you be screened at an earlier age.  If you have a family history of colorectal cancer, talk with your health care provider about genetic screening.  Your health care provider may also recommend using home test kits to check for hidden blood in your stool.  A small camera at the end of a tube can be used to examine your colon directly (sigmoidoscopy or colonoscopy). This is done to check for the earliest forms of colorectal cancer.  Direct examination of the colon should be repeated every 5-10 years until age 7. However, if early forms of precancerous polyps or small growths are found or if you have a family history or genetic risk for colorectal cancer, you may need to be screened more often. Skin Cancer  Check your skin from head to toe regularly.  Monitor any moles. Be sure to tell your health care provider:  About any new moles or changes in moles, especially if there is a change in a mole's shape or color.  If you have a mole that is larger than the size of a pencil eraser.  If any of your family members has a history of skin cancer, especially at a young age, talk with your health care provider about genetic screening.  Always use sunscreen. Apply sunscreen liberally and repeatedly throughout the day.  Whenever you are outside, protect yourself by wearing long sleeves, pants, a wide-brimmed hat, and sunglasses. What should I know about osteoporosis? Osteoporosis is a condition in which bone destruction happens more quickly than new bone creation. After menopause, you may be at an increased risk for osteoporosis. To help prevent osteoporosis or the bone fractures that can happen because of osteoporosis, the following is recommended:  If you are 19-50 years  old, get at least 1,000 mg of calcium and at least 600 mg of vitamin D per day.  If you are  older than age 74 but younger than age 29, get at least 1,200 mg of calcium and at least 600 mg of vitamin D per day.  If you are older than age 36, get at least 1,200 mg of calcium and at least 800 mg of vitamin D per day. Smoking and excessive alcohol intake increase the risk of osteoporosis. Eat foods that are rich in calcium and vitamin D, and do weight-bearing exercises several times each week as directed by your health care provider. What should I know about how menopause affects my mental health? Depression may occur at any age, but it is more common as you become older. Common symptoms of depression include:  Low or sad mood.  Changes in sleep patterns.  Changes in appetite or eating patterns.  Feeling an overall lack of motivation or enjoyment of activities that you previously enjoyed.  Frequent crying spells. Talk with your health care provider if you think that you are experiencing depression. What should I know about immunizations? It is important that you get and maintain your immunizations. These include:  Tetanus, diphtheria, and pertussis (Tdap) booster vaccine.  Influenza every year before the flu season begins.  Pneumonia vaccine.  Shingles vaccine. Your health care provider may also recommend other immunizations. This information is not intended to replace advice given to you by your health care provider. Make sure you discuss any questions you have with your health care provider. Document Released: 11/24/2005 Document Revised: 04/21/2016 Document Reviewed: 07/06/2015 Elsevier Interactive Patient Education  2017 [employer].

## 2016-12-27 NOTE — Assessment & Plan Note (Signed)
Patient follows with GYN, Dr. Lisbeth Renshaw, and politely declines having a breast exam or pelvic exam today. Screening labs ordered and consented for HIV and hepatitis C. Additionally, added in lipoprotein a which patient requested. We discussed lab and patient is unsure even if elevated if she would consider cholesterol medication. She would just like to know with her family history. Mammogram and Pap smear up-to-date. Colonoscopy is up-to-date. Nonsmoker. Immunizations up-to-date. Patient does not take the flu vaccine. Encouraged continued exercise.

## 2016-12-27 NOTE — Progress Notes (Signed)
Pre visit review using our clinic review tool, if applicable. No additional management support is needed unless otherwise documented below in the visit note. 

## 2017-01-02 ENCOUNTER — Other Ambulatory Visit (INDEPENDENT_AMBULATORY_CARE_PROVIDER_SITE_OTHER): Payer: BLUE CROSS/BLUE SHIELD

## 2017-01-02 DIAGNOSIS — Z0000 Encounter for general adult medical examination without abnormal findings: Secondary | ICD-10-CM | POA: Diagnosis not present

## 2017-01-02 LAB — HEMOGLOBIN A1C: Hgb A1c MFr Bld: 5.7 % (ref 4.6–6.5)

## 2017-01-02 LAB — COMPREHENSIVE METABOLIC PANEL
ALT: 21 U/L (ref 0–35)
AST: 18 U/L (ref 0–37)
Albumin: 4.5 g/dL (ref 3.5–5.2)
Alkaline Phosphatase: 85 U/L (ref 39–117)
BUN: 20 mg/dL (ref 6–23)
CALCIUM: 9.8 mg/dL (ref 8.4–10.5)
CO2: 31 mEq/L (ref 19–32)
Chloride: 103 mEq/L (ref 96–112)
Creatinine, Ser: 0.85 mg/dL (ref 0.40–1.20)
GFR: 72.73 mL/min (ref >60.00)
Glucose, Bld: 107 mg/dL — ABNORMAL HIGH (ref 70–99)
POTASSIUM: 4.3 meq/L (ref 3.5–5.1)
SODIUM: 140 meq/L (ref 135–145)
TOTAL PROTEIN: 7.2 g/dL (ref 6.0–8.3)
Total Bilirubin: 0.5 mg/dL (ref 0.2–1.2)

## 2017-01-02 LAB — CBC WITH DIFFERENTIAL/PLATELET
Basophils Absolute: 0.1 10*3/uL (ref 0.0–0.1)
Basophils Relative: 1.1 % (ref 0.0–3.0)
EOS PCT: 3.8 % (ref 0.0–5.0)
Eosinophils Absolute: 0.2 10*3/uL (ref 0.0–0.7)
HEMATOCRIT: 41.3 % (ref 36.0–46.0)
Hemoglobin: 13.9 g/dL (ref 12.0–15.0)
LYMPHS PCT: 42.9 % (ref 12.0–46.0)
Lymphs Abs: 2.5 10*3/uL (ref 0.7–4.0)
MCHC: 33.6 g/dL (ref 30.0–36.0)
MCV: 87.4 fl (ref 78.0–100.0)
MONOS PCT: 8.9 % (ref 3.0–12.0)
Monocytes Absolute: 0.5 10*3/uL (ref 0.1–1.0)
Neutro Abs: 2.6 10*3/uL (ref 1.4–7.7)
Neutrophils Relative %: 43.3 % (ref 43.0–77.0)
Platelets: 250 10*3/uL (ref 150.0–400.0)
RBC: 4.72 Mil/uL (ref 3.87–5.11)
RDW: 13.1 % (ref 11.5–15.5)
WBC: 5.9 10*3/uL (ref 4.0–10.5)

## 2017-01-02 LAB — VITAMIN D 25 HYDROXY (VIT D DEFICIENCY, FRACTURES): VITD: 28.42 ng/mL — ABNORMAL LOW (ref 30.00–100.00)

## 2017-01-02 LAB — LIPID PANEL
Cholesterol: 281 mg/dL — ABNORMAL HIGH (ref 0–200)
HDL: 54.4 mg/dL (ref >39.00)
LDL Cholesterol: 190 mg/dL — ABNORMAL HIGH (ref 0–99)
NONHDL: 226.25
Total CHOL/HDL Ratio: 5
Triglycerides: 182 mg/dL — ABNORMAL HIGH (ref 0.0–149.0)
VLDL: 36.4 mg/dL (ref 0.0–40.0)

## 2017-01-02 LAB — TSH: TSH: 2.47 u[IU]/mL (ref 0.35–4.50)

## 2017-01-03 LAB — HEPATITIS C ANTIBODY: HCV Ab: NEGATIVE

## 2017-01-03 LAB — HIV ANTIBODY (ROUTINE TESTING W REFLEX): HIV 1&2 Ab, 4th Generation: NONREACTIVE

## 2017-01-04 LAB — LIPOPROTEIN A (LPA): Lipoprotein (a): 30 nmol/L (ref <75)

## 2017-01-17 DIAGNOSIS — F4322 Adjustment disorder with anxiety: Secondary | ICD-10-CM | POA: Diagnosis not present

## 2017-03-23 DIAGNOSIS — Z6826 Body mass index (BMI) 26.0-26.9, adult: Secondary | ICD-10-CM | POA: Diagnosis not present

## 2017-03-23 DIAGNOSIS — Z1151 Encounter for screening for human papillomavirus (HPV): Secondary | ICD-10-CM | POA: Diagnosis not present

## 2017-03-23 DIAGNOSIS — Z01419 Encounter for gynecological examination (general) (routine) without abnormal findings: Secondary | ICD-10-CM | POA: Diagnosis not present

## 2017-08-30 ENCOUNTER — Other Ambulatory Visit: Payer: Self-pay | Admitting: Obstetrics & Gynecology

## 2017-08-30 DIAGNOSIS — Z1231 Encounter for screening mammogram for malignant neoplasm of breast: Secondary | ICD-10-CM

## 2017-09-03 ENCOUNTER — Encounter: Payer: Self-pay | Admitting: Family Medicine

## 2017-09-03 ENCOUNTER — Ambulatory Visit: Payer: BLUE CROSS/BLUE SHIELD | Admitting: Family Medicine

## 2017-09-03 VITALS — BP 138/84 | HR 66 | Temp 97.5°F | Ht 65.25 in | Wt 178.0 lb

## 2017-09-03 DIAGNOSIS — J0140 Acute pansinusitis, unspecified: Secondary | ICD-10-CM

## 2017-09-03 MED ORDER — AMOXICILLIN-POT CLAVULANATE 875-125 MG PO TABS: 1.0000 | ORAL_TABLET | Freq: Two times a day (BID) | ORAL | 0 refills | Status: DC

## 2017-09-03 MED ORDER — FLUCONAZOLE 150 MG PO TABS: 150.0000 mg | ORAL_TABLET | Freq: Once | ORAL | 0 refills | Status: AC

## 2017-09-03 NOTE — Progress Notes (Signed)
Patient ID: Lisa Gallegos, female   DOB: 01/23/1956, 61 y.o.   MRN: [id number]  PCP: Burnard Hawthorne, FNP  Subjective:  Lisa Gallegos is a 61 y.o. year old very pleasant female patient who presents with symptoms including nasal congestion, rhinitis, sinus pressure/pain, cough that is nonproductive, and post nasal drip -started: 5 days ago, symptoms are not improving -previous treatments: Honey that has provided limited benefit -sick contacts/travel/risks: denies flu exposure. No recent sick contact exposure. No recent antibiotic use -Hx of: allergies She is a nonsmoker  ROS-denies fever, SOB, NVD, tooth pain  Pertinent Past Medical History- allergies  Medications- reviewed  Current Outpatient Medications  Medication Sig Dispense Refill  . Cholecalciferol (VITAMIN D-3 PO) Take by mouth.    . Coenzyme Q10 (COQ10 PO) Take by mouth.    Adora Fridge VAGINAL 0.1 MG/GM vaginal cream   11  . ibuprofen (ADVIL,MOTRIN) 800 MG tablet Take 1 tablet (800 mg total) by mouth every 8 (eight) hours as needed. 90 tablet 0  . MAGNESIUM PO Take by mouth.    . Multiple Vitamins-Minerals (MULTIVITAMIN PO) Take by mouth.    . mupirocin ointment (BACTROBAN) 2 % Place 1 application into the nose 2 (two) times daily. 22 g 0   No current facility-administered medications for this visit.     Objective: BP 138/84   Pulse 66   Temp (!) 97.5 F (36.4 C)   Ht 5' 5.25" (1.657 m)   Wt 178 lb (80.7 kg)   SpO2 98%   BMI 29.39 kg/m  Gen: NAD, resting comfortably HEENT: Turbinates erythematous, TM normal, pharynx mildly erythematous with no tonsilar exudate or edema, + frontal/maxillary sinus tenderness CV: RRR no murmurs rubs or gallops Lungs: CTAB no crackles, wheeze, rhonchi Abdomen: soft/nontender/nondistended/normal bowel sounds. No rebound or guarding.  Ext: no edema Skin: warm, dry, no rash Neuro: grossly normal, moves all extremities  Assessment/Plan: 1. Acute pansinusitis, recurrence not  specified Discussed that symptoms may likely be viral in nature. Provided Augmentin for patient to use if symptoms do not improve, worsen, or she develops a fever. Her daughter is getting married in 5 days and she is concerned that symptoms may worsen over the holiday weekend and also during wedding preparations. Advised patient on supportive measures:  Get rest, drink plenty of fluids, and use tylenol or ibuprofen as needed for pain. Follow up if fever >101, if symptoms worsen or if symptoms are not improving in 3 to 4  days. Patient verbalizes understanding.    Finally, we reviewed reasons to return to care including if symptoms worsen or persist or new concerns arise- once again particularly shortness of breath or fever.   Laurita Quint, FNP

## 2017-09-03 NOTE — Patient Instructions (Signed)
It was a pleasure to see you today.   Get rest, drink plenty of fluids, and use tylenol or ibuprofen as needed for pain. Follow up if fever >101, if symptoms worsen or if symptoms are not improved in 3 to 4 days.     Feel better soon!   Sinusitis, Adult Sinusitis is soreness and inflammation of your sinuses. Sinuses are hollow spaces in the bones around your face. They are located:  Around your eyes.  In the middle of your forehead.  Behind your nose.  In your cheekbones.  Your sinuses and nasal passages are lined with a stringy fluid (mucus). Mucus normally drains out of your sinuses. When your nasal tissues get inflamed or swollen, the mucus can get trapped or blocked so air cannot flow through your sinuses. This lets bacteria, viruses, and funguses grow, and that leads to infection. Follow these instructions at home: Medicines  Take, use, or apply over-the-counter and prescription medicines only as told by your doctor. These may include nasal sprays.  If you were prescribed an antibiotic medicine, take it as told by your doctor. Do not stop taking the antibiotic even if you start to feel better. Hydrate and Humidify  Drink enough water to keep your pee (urine) clear or pale yellow.  Use a cool mist humidifier to keep the humidity level in your home above 50%.  Breathe in steam for 10-15 minutes, 3-4 times a day or as told by your doctor. You can do this in the bathroom while a hot shower is running.  Try not to spend time in cool or dry air. Rest  Rest as much as possible.  Sleep with your head raised (elevated).  Make sure to get enough sleep each night. General instructions  Put a warm, moist washcloth on your face 3-4 times a day or as told by your doctor. This will help with discomfort.  Wash your hands often with soap and water. If there is no soap and water, use hand sanitizer.  Do not smoke. Avoid being around people who are smoking (secondhand  smoke).  Keep all follow-up visits as told by your doctor. This is important. Contact a doctor if:  You have a fever.  Your symptoms get worse.  Your symptoms do not get better within 10 days. Get help right away if:  You have a very bad headache.  You cannot stop throwing up (vomiting).  You have pain or swelling around your face or eyes.  You have trouble seeing.  You feel confused.  Your neck is stiff.  You have trouble breathing. This information is not intended to replace advice given to you by your health care provider. Make sure you discuss any questions you have with your health care provider. Document Released: 03/20/2008 Document Revised: 05/28/2016 Document Reviewed: 07/28/2015 Elsevier Interactive Patient Education  [employer].

## 2017-09-28 ENCOUNTER — Encounter: Payer: Self-pay | Admitting: Radiology

## 2017-09-28 ENCOUNTER — Ambulatory Visit
Admission: RE | Admit: 2017-09-28 | Discharge: 2017-09-28 | Disposition: A | Discharge status: Home / Self Care | Payer: BLUE CROSS/BLUE SHIELD | Source: Ambulatory Visit | Attending: Obstetrics & Gynecology | Admitting: Obstetrics & Gynecology

## 2017-09-28 DIAGNOSIS — Z1231 Encounter for screening mammogram for malignant neoplasm of breast: Secondary | ICD-10-CM | POA: Diagnosis not present

## 2017-11-06 DIAGNOSIS — H2513 Age-related nuclear cataract, bilateral: Secondary | ICD-10-CM | POA: Diagnosis not present

## 2017-11-06 DIAGNOSIS — H33302 Unspecified retinal break, left eye: Secondary | ICD-10-CM | POA: Diagnosis not present

## 2017-11-06 DIAGNOSIS — H5203 Hypermetropia, bilateral: Secondary | ICD-10-CM | POA: Diagnosis not present

## 2017-12-19 ENCOUNTER — Ambulatory Visit: Payer: BLUE CROSS/BLUE SHIELD | Admitting: Family Medicine

## 2017-12-19 ENCOUNTER — Encounter: Payer: Self-pay | Admitting: Family Medicine

## 2017-12-19 VITALS — BP 122/70 | HR 90 | Temp 99.5°F | Wt 181.0 lb

## 2017-12-19 DIAGNOSIS — J020 Streptococcal pharyngitis: Secondary | ICD-10-CM

## 2017-12-19 DIAGNOSIS — J029 Acute pharyngitis, unspecified: Secondary | ICD-10-CM

## 2017-12-19 LAB — POCT RAPID STREP A (OFFICE): RAPID STREP A SCREEN: POSITIVE — AB

## 2017-12-19 MED ORDER — FLUCONAZOLE 150 MG PO TABS: 150.0000 mg | ORAL_TABLET | Freq: Once | ORAL | 0 refills | Status: AC

## 2017-12-19 MED ORDER — AMOXICILLIN 500 MG PO CAPS: 500.0000 mg | ORAL_CAPSULE | Freq: Two times a day (BID) | ORAL | 0 refills | Status: AC

## 2017-12-19 NOTE — Patient Instructions (Signed)
Strep Throat Strep throat is a bacterial infection of the throat. Your health care provider may call the infection tonsillitis or pharyngitis, depending on whether there is swelling in the tonsils or at the back of the throat. Strep throat is most common during the cold months of the year in children who are 5-60 years of age, but it can happen during any season in people of any age. This infection is spread from person to person (contagious) through coughing, sneezing, or close contact. What are the causes? Strep throat is caused by the bacteria called Streptococcus pyogenes. What increases the risk? This condition is more likely to develop in:  People who spend time in crowded places where the infection can spread easily.  People who have close contact with someone who has strep throat.  What are the signs or symptoms? Symptoms of this condition include:  Fever or chills.  Redness, swelling, or pain in the tonsils or throat.  Pain or difficulty when swallowing.  White or yellow spots on the tonsils or throat.  Swollen, tender glands in the neck or under the jaw.  Red rash all over the body (rare).  How is this diagnosed? This condition is diagnosed by performing a rapid strep test or by taking a swab of your throat (throat culture test). Results from a rapid strep test are usually ready in a few minutes, but throat culture test results are available after one or two days. How is this treated? This condition is treated with antibiotic medicine. Follow these instructions at home: Medicines  Take over-the-counter and prescription medicines only as told by your health care provider.  Take your antibiotic as told by your health care provider. Do not stop taking the antibiotic even if you start to feel better.  Have family members who also have a sore throat or fever tested for strep throat. They may need antibiotics if they have the strep infection. Eating and drinking  Do not  share food, drinking cups, or personal items that could cause the infection to spread to other people.  If swallowing is difficult, try eating soft foods until your sore throat feels better.  Drink enough fluid to keep your urine clear or pale yellow. General instructions  Gargle with a salt-water mixture 3-4 times per day or as needed. To make a salt-water mixture, completely dissolve -1 tsp of salt in 1 cup of warm water.  Make sure that all household members wash their hands well.  Get plenty of rest.  Stay home from school or work until you have been taking antibiotics for 24 hours.  Keep all follow-up visits as told by your health care provider. This is important. Contact a health care provider if:  The glands in your neck continue to get bigger.  You develop a rash, cough, or earache.  You cough up a thick liquid that is green, yellow-brown, or bloody.  You have pain or discomfort that does not get better with medicine.  Your problems seem to be getting worse rather than better.  You have a fever. Get help right away if:  You have new symptoms, such as vomiting, severe headache, stiff or painful neck, chest pain, or shortness of breath.  You have severe throat pain, drooling, or changes in your voice.  You have swelling of the neck, or the skin on the neck becomes red and tender.  You have signs of dehydration, such as fatigue, dry mouth, and decreased urination.  You become increasingly sleepy, or   you cannot wake up completely.  Your joints become red or painful. This information is not intended to replace advice given to you by your health care provider. Make sure you discuss any questions you have with your health care provider. Document Released: 09/29/2000 Document Revised: 05/31/2016 Document Reviewed: 01/25/2015 Elsevier Interactive Patient Education  2018 [employer]  

## 2017-12-19 NOTE — Progress Notes (Signed)
Deetta Siegmann is a 62 y.o. female presenting with a sore throat for 3 days.  Associated symptoms include:  fever, muscle aches and headache.  Symptoms are constant.  Home treatment thus far includes:  rest, hydration and NSAIDS/acetaminophen.  Known sick contacts with similar symptoms include pt's grandson.  There is no history of of similar symptoms.  ROS General: Denies chills, night sweats, changes in weight, changes in appetite  +fever, body aches HEENT: Denies ear pain, changes in vision, rhinorrhea   +sore throat, HA CV: Denies CP, palpitations, SOB, orthopnea Pulm: Denies SOB, cough, wheezing GI: Denies abdominal pain, nausea, vomiting, diarrhea, constipation GU: Denies dysuria, hematuria, frequency, vaginal discharge Msk: Denies muscle cramps, joint pains Neuro: Denies weakness, numbness, tingling Skin: Denies rashes, bruising Psych: Denies depression, anxiety, hallucinations  Objective Exam:  BP 122/70 (BP Location: Right Arm, Patient Position: Sitting, Cuff Size: Large)   Pulse 90   Temp 99.5 F (37.5 C) (Oral)   Wt 181 lb (82.1 kg)   LMP 02/17/2012   SpO2 97%   BMI 29.89 kg/m  Gen. Pleasant, well developed, well-nourished, in NAD HEENT - [place]/AT, PERRL, no scleral icterus, no nasal drainage, pharynx with erythema and mild exudate.  TMs normal bilaterally.  Mild cervical lymphadenopathy. Lungs: no use of accessory muscles, CTAB, no wheezes, rales or rhonchi Cardiovascular: RRR, No r/g/m, no peripheral edema Abdomen: BS present, soft, nontender,nondistended   A/P: Sore throat -Rapid strep positive  Strep pharyngitis -Rapid strep positive -We will send an Rx for amoxicillin 500 mg twice daily times 10 days -Patient given handout -Patient encouraged to change toothbrush, continue rest, hydration, okay to use NSAIDs for pain/discomfort. -Follow-up PRN  Grier Mitts, MD

## 2018-03-13 ENCOUNTER — Encounter: Payer: Self-pay | Admitting: Family

## 2018-03-15 ENCOUNTER — Ambulatory Visit: Payer: BLUE CROSS/BLUE SHIELD | Admitting: Family Medicine

## 2018-03-15 ENCOUNTER — Encounter: Payer: Self-pay | Admitting: Family Medicine

## 2018-03-15 VITALS — BP 120/80 | HR 81 | Temp 98.6°F | Wt 180.3 lb

## 2018-03-15 DIAGNOSIS — J029 Acute pharyngitis, unspecified: Secondary | ICD-10-CM | POA: Diagnosis not present

## 2018-03-15 LAB — POCT RAPID STREP A (OFFICE): RAPID STREP A SCREEN: NEGATIVE

## 2018-03-15 NOTE — Patient Instructions (Signed)

## 2018-03-15 NOTE — Progress Notes (Signed)
  Subjective:     Patient ID: Lisa Gallegos, female   DOB: 1956-02-10, 62 y.o.   MRN: [id number]  HPI  Patient seen with chief complaint of sore throat. Onset couple days ago. She states that she had strep throat back in March but those symptoms fully resolved. She has a 55-year-old grandson and apparently he was diagnosed again with strep recently. Patient has had some mild cough and mild nasal congestion. No fever. She does not feel as sick as she did when she had recent strep. No consistent headaches.  No past medical history on file. Past Surgical History:  Procedure Laterality Date  . CESAREAN SECTION  1985   FTP  . KIDNEY STONE SURGERY     laser surgery    reports that she has never smoked. She has never used smokeless tobacco. She reports that she drinks alcohol. She reports that she does not use drugs. family history includes Cancer in her mother; Heart disease in her father. No Known Allergies  Review of Systems  Constitutional: Negative for chills and fever.  HENT: Positive for congestion and sore throat.   Respiratory: Positive for cough.        Objective:   Physical Exam  Constitutional: She appears well-developed and well-nourished.  HENT:  Right Ear: Tympanic membrane normal.  Left Ear: Tympanic membrane normal.  Only very minimal posterior pharynx erythema. No exudate. No tonsillar asymmetry  Neck: Neck supple.  Cardiovascular: Normal rate and regular rhythm.  Pulmonary/Chest: Effort normal and breath sounds normal. She has no wheezes. She has no rales.  Lymphadenopathy:    She has no cervical adenopathy.       Assessment:     Acute pharyngitis. Rapid strep negative. Suspect viral    Plan:     -Treat symptomatically with Advil or Tylenol as needed for sore throat symptoms. -Follow-up for any persistent or worsening symptoms  Eulas Post MD [facility]

## 2018-03-25 DIAGNOSIS — Z6829 Body mass index (BMI) 29.0-29.9, adult: Secondary | ICD-10-CM | POA: Diagnosis not present

## 2018-03-25 DIAGNOSIS — Z01419 Encounter for gynecological examination (general) (routine) without abnormal findings: Secondary | ICD-10-CM | POA: Diagnosis not present

## 2018-04-05 ENCOUNTER — Encounter: Payer: Self-pay | Admitting: Family

## 2018-04-05 ENCOUNTER — Ambulatory Visit (INDEPENDENT_AMBULATORY_CARE_PROVIDER_SITE_OTHER): Payer: BLUE CROSS/BLUE SHIELD | Admitting: Family

## 2018-04-05 ENCOUNTER — Other Ambulatory Visit: Payer: Self-pay

## 2018-04-05 VITALS — BP 124/82 | HR 60 | Temp 98.6°F | Wt 176.6 lb

## 2018-04-05 DIAGNOSIS — E559 Vitamin D deficiency, unspecified: Secondary | ICD-10-CM | POA: Diagnosis not present

## 2018-04-05 DIAGNOSIS — R635 Abnormal weight gain: Secondary | ICD-10-CM | POA: Diagnosis not present

## 2018-04-05 DIAGNOSIS — Z0000 Encounter for general adult medical examination without abnormal findings: Secondary | ICD-10-CM

## 2018-04-05 DIAGNOSIS — F418 Other specified anxiety disorders: Secondary | ICD-10-CM | POA: Diagnosis not present

## 2018-04-05 LAB — COMPREHENSIVE METABOLIC PANEL
ALK PHOS: 84 U/L (ref 39–117)
ALT: 16 U/L (ref 0–35)
AST: 18 U/L (ref 0–37)
Albumin: 4.5 g/dL (ref 3.5–5.2)
BUN: 19 mg/dL (ref 6–23)
CO2: 30 mEq/L (ref 19–32)
Calcium: 9.8 mg/dL (ref 8.4–10.5)
Chloride: 104 mEq/L (ref 96–112)
Creatinine, Ser: 0.84 mg/dL (ref 0.40–1.20)
GFR: 73.42 mL/min (ref >60.00)
GLUCOSE: 100 mg/dL — AB (ref 70–99)
POTASSIUM: 4.4 meq/L (ref 3.5–5.1)
SODIUM: 140 meq/L (ref 135–145)
TOTAL PROTEIN: 7.6 g/dL (ref 6.0–8.3)
Total Bilirubin: 0.4 mg/dL (ref 0.2–1.2)

## 2018-04-05 LAB — LIPID PANEL
Cholesterol: 255 mg/dL — ABNORMAL HIGH (ref 0–200)
HDL: 61 mg/dL (ref >39.00)
LDL CALC: 176 mg/dL — AB (ref 0–99)
NONHDL: 194.39
Total CHOL/HDL Ratio: 4
Triglycerides: 93 mg/dL (ref 0.0–149.0)
VLDL: 18.6 mg/dL (ref 0.0–40.0)

## 2018-04-05 LAB — VITAMIN D 25 HYDROXY (VIT D DEFICIENCY, FRACTURES): VITD: 27.01 ng/mL — ABNORMAL LOW (ref 30.00–100.00)

## 2018-04-05 LAB — TSH: TSH: 1.63 u[IU]/mL (ref 0.35–4.50)

## 2018-04-05 MED ORDER — HYDROXYZINE HCL 25 MG PO TABS: ORAL_TABLET | ORAL | 0 refills | Status: DC

## 2018-04-05 MED ORDER — BUPROPION HCL ER (XL) 150 MG PO TB24: ORAL_TABLET | ORAL | 3 refills | Status: DC

## 2018-04-05 NOTE — Progress Notes (Signed)
Subjective:    Patient ID: Lisa Gallegos, female    DOB: [birth year], 62 y.o.   MRN: [id number]  CC: Lisa Gallegos is a 62 y.o. female who presents today for physical exam.    HPI: Feeling well. Frustrated by weight.  Doing intermittent fasting with no results.  He tried keto.  Following healthy diet.  No seizure history.  Very occasional alcohol such as a glass of wine once a week. Flight in [place].  Would like something for anxiety with long flight to [place].  Tried Ambien in the past and did not like side effects.     Colorectal Cancer Screening: UTD  Breast Cancer Screening: Mammogram UTD Cervical Cancer Screening: UTD per patient., follows with Dr Genia Harold , 03/2018 Bone Health screening/DEXA for 65+: No increased fracture risk. Defer screening at this time. Lung Cancer Screening: Doesn't have 30 year pack year history and age > 2 years. Immunizations       Tetanus - utd        Labs: Screening labs today. Exercise: Gets regular exercise. walking.  Alcohol use: Occasional Smoking/tobacco use: Nonsmoker.  Regular dental exams:UTD Wears seat belt: Yes. Skin: follows with dermatology  HISTORY:  History reviewed. No pertinent past medical history.  Past Surgical History:  Procedure Laterality Date  . CESAREAN SECTION  1985   FTP  . KIDNEY STONE SURGERY     laser surgery   Family History  Problem Relation Age of Onset  . Cancer Mother        Pancreatic cancer   . Heart disease Father       ALLERGIES: Patient has no known allergies.  Current Outpatient Medications on File Prior to Visit  Medication Sig Dispense Refill  . Cholecalciferol (VITAMIN D-3 PO) Take by mouth.    . Coenzyme Q10 (COQ10 PO) Take by mouth.    . ESTRACE VAGINAL 0.1 MG/GM vaginal cream   11  . MAGNESIUM PO Take by mouth.    . Multiple Vitamins-Minerals (MULTIVITAMIN PO) Take by mouth.     No current facility-administered medications on file prior to visit.     Social History   Tobacco Use  .  Smoking status: Never Smoker  . Smokeless tobacco: Never Used  Substance Use Topics  . Alcohol use: Yes    Alcohol/week: 0.0 oz    Comment: couple drinks a week   . Drug use: No    Review of Systems  Constitutional: Negative for chills, fatigue, fever and unexpected weight change.  HENT: Negative for congestion.   Respiratory: Negative for cough.   Cardiovascular: Negative for chest pain, palpitations and leg swelling.  Gastrointestinal: Negative for nausea and vomiting.  Genitourinary: Negative for dysuria, hematuria and vaginal pain.  Musculoskeletal: Negative for arthralgias and myalgias.  Skin: Negative for rash.  Neurological: Negative for headaches.  Hematological: Negative for adenopathy.  Psychiatric/Behavioral: Negative for confusion.      Objective:    BP 124/82 (BP Location: Left Arm, Patient Position: Sitting, Cuff Size: Normal)   Pulse 60   Temp 98.6 F (37 C)   Wt 176 lb 9.6 oz (80.1 kg)   LMP 02/17/2012   SpO2 98%   BMI 29.16 kg/m   BP Readings from Last 3 Encounters:  04/05/18 124/82  03/15/18 120/80  12/19/17 122/70   Wt Readings from Last 3 Encounters:  04/05/18 176 lb 9.6 oz (80.1 kg)  03/15/18 180 lb 4.8 oz (81.8 kg)  12/19/17 181 lb (82.1 kg)    Physical Exam  Constitutional: She appears well-developed and well-nourished.  Eyes: Conjunctivae are normal.  Neck: No thyroid mass and no thyromegaly present.  Cardiovascular: Normal rate, regular rhythm, normal heart sounds and normal pulses.  Pulmonary/Chest: Effort normal and breath sounds normal. She has no wheezes. She has no rhonchi. She has no rales. Right breast exhibits no inverted nipple, no mass, no nipple discharge, no skin change and no tenderness. Left breast exhibits no inverted nipple, no mass, no nipple discharge, no skin change and no tenderness. Breasts are symmetrical.  CBE performed.   Lymphadenopathy:       Head (right side): No submental, no submandibular, no tonsillar, no  preauricular, no posterior auricular and no occipital adenopathy present.       Head (left side): No submental, no submandibular, no tonsillar, no preauricular, no posterior auricular and no occipital adenopathy present.    She has no cervical adenopathy.       Right cervical: No superficial cervical, no deep cervical and no posterior cervical adenopathy present.      Left cervical: No superficial cervical, no deep cervical and no posterior cervical adenopathy present.    She has no axillary adenopathy.  Neurological: She is alert.  Skin: Skin is warm and dry.  Psychiatric: She has a normal mood and affect. Her speech is normal and behavior is normal. Thought content normal.  Vitals reviewed.      Assessment & Plan:   Problem List Items Addressed This Visit      Other   Weight gain    Discussed increasing exercise including weights ,more cardio.  Patient agreed to do a trial of Wellbutrin.  She will let me know how she doing on this medication.      Relevant Medications   buPROPion (WELLBUTRIN XL) 150 MG 24 hr tablet   Other Relevant Orders   TSH   Routine physical examination - Primary    Clinical breast exam performed.  Pelvic exam deferred in the absence of complaints, and Pap is also up-to-date per patient.  She follows with OB/GYN.  Screening labs and appropriate labs ordered today.  No history of anemia therefore CBC was jointly agreed not to be ordered      Relevant Orders   Comprehensive metabolic panel   Lipid panel   TSH   VITAMIN D 25 Hydroxy (Vit-D Deficiency, Fractures)   Lipoprotein A (LPA)   Situational anxiety    Situational in regards to long flights.  Trial of Atarax.  Patient let me know how this medication works for her.      Relevant Medications   buPROPion (WELLBUTRIN XL) 150 MG 24 hr tablet   hydrOXYzine (ATARAX/VISTARIL) 25 MG tablet   Vitamin D deficiency    History of, pending labs       Relevant Orders   VITAMIN D 25 Hydroxy (Vit-D  Deficiency, Fractures)       I am having Lisa Gallegos start on buPROPion and hydrOXYzine. I am also having her maintain her ESTRACE VAGINAL, Multiple Vitamins-Minerals (MULTIVITAMIN PO), MAGNESIUM PO, Coenzyme Q10 (COQ10 PO), and Cholecalciferol (VITAMIN D-3 PO).   Meds ordered this encounter  Medications  . buPROPion (WELLBUTRIN XL) 150 MG 24 hr tablet    Sig: Start 150 mg ER PO qam, increase after 3 days to two tablets (300mg ) po qam.    Dispense:  120 tablet    Refill:  3    Order Specific Question:   Supervising Provider    Answer:   Crecencio Mc [  [id number]]  . hydrOXYzine (ATARAX/VISTARIL) 25 MG tablet    Sig: Take half to one tablet 30-60 minutes prior to bedtime for insomnia, anxiety. May increase to two tablets ( 50mg ).    Dispense:  30 tablet    Refill:  0    Order Specific Question:   Supervising Provider    Answer:   Crecencio Mc [[id number]]    Return precautions given.   Risks, benefits, and alternatives of the medications and treatment plan prescribed today were discussed, and patient expressed understanding.   Education regarding symptom management and diagnosis given to patient on AVS.   Continue to follow with Burnard Hawthorne, FNP for routine health maintenance.   Lisa Gallegos and I agreed with plan.   Mable Paris, FNP

## 2018-04-05 NOTE — Patient Instructions (Signed)
Pleasure seeing you. Trial of Wellbutrin as discussed. You may also try the Atarax half tablet prior to your travel plans.  Please let me know how these medications do for you   Health Maintenance for Postmenopausal Women Menopause is a normal process in which your reproductive ability comes to an end. This process happens gradually over a span of months to years, usually between the ages of 14 and 39. Menopause is complete when you have missed 12 consecutive menstrual periods. It is important to talk with your health care provider about some of the most common conditions that affect postmenopausal women, such as heart disease, cancer, and bone loss (osteoporosis). Adopting a healthy lifestyle and getting preventive care can help to promote your health and wellness. Those actions can also lower your chances of developing some of these common conditions. What should I know about menopause? During menopause, you may experience a number of symptoms, such as:  Moderate-to-severe hot flashes.  Night sweats.  Decrease in sex drive.  Mood swings.  Headaches.  Tiredness.  Irritability.  Memory problems.  Insomnia.  Choosing to treat or not to treat menopausal changes is an individual decision that you make with your health care provider. What should I know about hormone replacement therapy and supplements? Hormone therapy products are effective for treating symptoms that are associated with menopause, such as hot flashes and night sweats. Hormone replacement carries certain risks, especially as you become older. If you are thinking about using estrogen or estrogen with progestin treatments, discuss the benefits and risks with your health care provider. What should I know about heart disease and stroke? Heart disease, heart attack, and stroke become more likely as you age. This may be due, in part, to the hormonal changes that your body experiences during menopause. These can affect how your  body processes dietary fats, triglycerides, and cholesterol. Heart attack and stroke are both medical emergencies. There are many things that you can do to help prevent heart disease and stroke:  Have your blood pressure checked at least every 1-2 years. High blood pressure causes heart disease and increases the risk of stroke.  If you are 63-46 years old, ask your health care provider if you should take aspirin to prevent a heart attack or a stroke.  Do not use any tobacco products, including cigarettes, chewing tobacco, or electronic cigarettes. If you need help quitting, ask your health care provider.  It is important to eat a healthy diet and maintain a healthy weight. ? Be sure to include plenty of vegetables, fruits, low-fat dairy products, and lean protein. ? Avoid eating foods that are high in solid fats, added sugars, or salt (sodium).  Get regular exercise. This is one of the most important things that you can do for your health. ? Try to exercise for at least 150 minutes each week. The type of exercise that you do should increase your heart rate and make you sweat. This is known as moderate-intensity exercise. ? Try to do strengthening exercises at least twice each week. Do these in addition to the moderate-intensity exercise.  Know your numbers.Ask your health care provider to check your cholesterol and your blood glucose. Continue to have your blood tested as directed by your health care provider.  What should I know about cancer screening? There are several types of cancer. Take the following steps to reduce your risk and to catch any cancer development as early as possible. Breast Cancer  Practice breast self-awareness. ? This means  understanding how your breasts normally appear and feel. ? It also means doing regular breast self-exams. Let your health care provider know about any changes, no matter how small.  If you are 39 or older, have a clinician do a breast exam  (clinical breast exam or CBE) every year. Depending on your age, family history, and medical history, it may be recommended that you also have a yearly breast X-ray (mammogram).  If you have a family history of breast cancer, talk with your health care provider about genetic screening.  If you are at high risk for breast cancer, talk with your health care provider about having an MRI and a mammogram every year.  Breast cancer (BRCA) gene test is recommended for women who have family members with BRCA-related cancers. Results of the assessment will determine the need for genetic counseling and BRCA1 and for BRCA2 testing. BRCA-related cancers include these types: ? Breast. This occurs in males or females. ? Ovarian. ? Tubal. This may also be called fallopian tube cancer. ? Cancer of the abdominal or pelvic lining (peritoneal cancer). ? Prostate. ? Pancreatic.  Cervical, Uterine, and Ovarian Cancer Your health care provider may recommend that you be screened regularly for cancer of the pelvic organs. These include your ovaries, uterus, and vagina. This screening involves a pelvic exam, which includes checking for microscopic changes to the surface of your cervix (Pap test).  For women ages 21-65, health care providers may recommend a pelvic exam and a Pap test every three years. For women ages 84-65, they may recommend the Pap test and pelvic exam, combined with testing for human papilloma virus (HPV), every five years. Some types of HPV increase your risk of cervical cancer. Testing for HPV may also be done on women of any age who have unclear Pap test results.  Other health care providers may not recommend any screening for nonpregnant women who are considered low risk for pelvic cancer and have no symptoms. Ask your health care provider if a screening pelvic exam is right for you.  If you have had past treatment for cervical cancer or a condition that could lead to cancer, you need Pap tests  and screening for cancer for at least 20 years after your treatment. If Pap tests have been discontinued for you, your risk factors (such as having a new sexual partner) need to be reassessed to determine if you should start having screenings again. Some women have medical problems that increase the chance of getting cervical cancer. In these cases, your health care provider may recommend that you have screening and Pap tests more often.  If you have a family history of uterine cancer or ovarian cancer, talk with your health care provider about genetic screening.  If you have vaginal bleeding after reaching menopause, tell your health care provider.  There are currently no reliable tests available to screen for ovarian cancer.  Lung Cancer Lung cancer screening is recommended for adults 50-37 years old who are at high risk for lung cancer because of a history of smoking. A yearly low-dose CT scan of the lungs is recommended if you:  Currently smoke.  Have a history of at least 30 pack-years of smoking and you currently smoke or have quit within the past 15 years. A pack-year is smoking an average of one pack of cigarettes per day for one year.  Yearly screening should:  Continue until it has been 15 years since you quit.  Stop if you develop a health  would prevent you from having lung cancer treatment.  Colorectal Cancer  This type of cancer can be detected and can often be prevented.  Routine colorectal cancer screening usually begins at age 50 and continues through age 75.  If you have risk factors for colon cancer, your health care provider may recommend that you be screened at an earlier age.  If you have a family history of colorectal cancer, talk with your health care provider about genetic screening.  Your health care provider may also recommend using home test kits to check for hidden blood in your stool.  A small camera at the end of a tube can be used to  examine your colon directly (sigmoidoscopy or colonoscopy). This is done to check for the earliest forms of colorectal cancer.  Direct examination of the colon should be repeated every 5-10 years until age 75. However, if early forms of precancerous polyps or small growths are found or if you have a family history or genetic risk for colorectal cancer, you may need to be screened more often.  Skin Cancer  Check your skin from head to toe regularly.  Monitor any moles. Be sure to tell your health care provider: ? About any new moles or changes in moles, especially if there is a change in a mole's shape or color. ? If you have a mole that is larger than the size of a pencil eraser.  If any of your family members has a history of skin cancer, especially at a young age, talk with your health care provider about genetic screening.  Always use sunscreen. Apply sunscreen liberally and repeatedly throughout the day.  Whenever you are outside, protect yourself by wearing long sleeves, pants, a wide-brimmed hat, and sunglasses.  What should I know about osteoporosis? Osteoporosis is a condition in which bone destruction happens more quickly than new bone creation. After menopause, you may be at an increased risk for osteoporosis. To help prevent osteoporosis or the bone fractures that can happen because of osteoporosis, the following is recommended:  If you are 19-50 years old, get at least 1,000 mg of calcium and at least 600 mg of vitamin D per day.  If you are older than age 50 but younger than age 70, get at least 1,200 mg of calcium and at least 600 mg of vitamin D per day.  If you are older than age 70, get at least 1,200 mg of calcium and at least 800 mg of vitamin D per day.  Smoking and excessive alcohol intake increase the risk of osteoporosis. Eat foods that are rich in calcium and vitamin D, and do weight-bearing exercises several times each week as directed by your health care  provider. What should I know about how menopause affects my mental health? Depression may occur at any age, but it is more common as you become older. Common symptoms of depression include:  Low or sad mood.  Changes in sleep patterns.  Changes in appetite or eating patterns.  Feeling an overall lack of motivation or enjoyment of activities that you previously enjoyed.  Frequent crying spells.  Talk with your health care provider if you think that you are experiencing depression. What should I know about immunizations? It is important that you get and maintain your immunizations. These include:  Tetanus, diphtheria, and pertussis (Tdap) booster vaccine.  Influenza every year before the flu season begins.  Pneumonia vaccine.  Shingles vaccine.  Your health care provider may also recommend other immunizations.   This information is not intended to replace advice given to you by your health care provider. Make sure you discuss any questions you have with your health care provider. Document Released: 11/24/2005 Document Revised: 04/21/2016 Document Reviewed: 07/06/2015 Elsevier Interactive Patient Education  2018 [employer]vier Inc.  

## 2018-04-05 NOTE — Assessment & Plan Note (Signed)
History of, pending labs

## 2018-04-05 NOTE — Assessment & Plan Note (Signed)
Clinical breast exam performed.  Pelvic exam deferred in the absence of complaints, and Pap is also up-to-date per patient.  She follows with OB/GYN.  Screening labs and appropriate labs ordered today.  No history of anemia therefore CBC was jointly agreed not to be ordered

## 2018-04-05 NOTE — Assessment & Plan Note (Signed)
Situational in regards to long flights.  Trial of Atarax.  Patient let me know how this medication works for her.

## 2018-04-05 NOTE — Assessment & Plan Note (Signed)
Discussed increasing exercise including weights ,more cardio.  Patient agreed to do a trial of Wellbutrin.  She will let me know how she doing on this medication.

## 2018-04-08 LAB — LIPOPROTEIN A (LPA): LIPOPROTEIN (A) (CARDIO IQ ADV LIPID PANEL): 36 nmol/L (ref <75)

## 2018-05-02 ENCOUNTER — Other Ambulatory Visit: Payer: Self-pay | Admitting: Family

## 2018-05-02 DIAGNOSIS — F418 Other specified anxiety disorders: Secondary | ICD-10-CM

## 2018-05-06 ENCOUNTER — Telehealth: Payer: Self-pay | Admitting: Family

## 2018-05-06 NOTE — Telephone Encounter (Signed)
Called [facility] and gave directions per office note spoke with Lisa Gallegos Take half to one tablet 30-60 minutes prior to bedtime for insomnia, anxiety. May increase to two tablets ( 50mg ).

## 2018-05-06 NOTE — Telephone Encounter (Signed)
Copied from [id number]. Topic: Quick Communication - Rx Refill/Question >> May 06, 2018  1:40 PM Margot Ables wrote: Medication: hydrOXYzine (ATARAX/VISTARIL) 25 MG tablet - pharmacy needs clarified directions on RX  [id number], [place] [phone] (Phone) [phone] (Fax)

## 2018-05-09 ENCOUNTER — Observation Stay (HOSPITAL_COMMUNITY)
Admission: EM | Admit: 2018-05-09 | Discharge: 2018-05-10 | Disposition: A | Discharge status: Home / Self Care | Payer: BLUE CROSS/BLUE SHIELD | Attending: General Surgery | Admitting: General Surgery

## 2018-05-09 ENCOUNTER — Encounter (HOSPITAL_COMMUNITY): Payer: Self-pay | Admitting: *Deleted

## 2018-05-09 ENCOUNTER — Other Ambulatory Visit: Payer: Self-pay

## 2018-05-09 DIAGNOSIS — R109 Unspecified abdominal pain: Secondary | ICD-10-CM | POA: Diagnosis not present

## 2018-05-09 DIAGNOSIS — K3589 Other acute appendicitis without perforation or gangrene: Secondary | ICD-10-CM | POA: Diagnosis not present

## 2018-05-09 DIAGNOSIS — K3533 Acute appendicitis with perforation and localized peritonitis, with abscess: Secondary | ICD-10-CM

## 2018-05-09 DIAGNOSIS — K353 Acute appendicitis with localized peritonitis, without perforation or gangrene: Secondary | ICD-10-CM | POA: Diagnosis not present

## 2018-05-09 DIAGNOSIS — K37 Unspecified appendicitis: Secondary | ICD-10-CM | POA: Diagnosis present

## 2018-05-09 DIAGNOSIS — R1031 Right lower quadrant pain: Secondary | ICD-10-CM | POA: Diagnosis not present

## 2018-05-09 LAB — COMPREHENSIVE METABOLIC PANEL
ALT: 17 U/L (ref 0–44)
AST: 21 U/L (ref 15–41)
Albumin: 4.3 g/dL (ref 3.5–5.0)
Alkaline Phosphatase: 81 U/L (ref 38–126)
Anion gap: 10 (ref 5–15)
BUN: 20 mg/dL (ref 6–20)
CHLORIDE: 102 mmol/L (ref 98–111)
CO2: 24 mmol/L (ref 22–32)
CREATININE: 0.96 mg/dL (ref 0.44–1.00)
Calcium: 9.2 mg/dL (ref 8.9–10.3)
GFR calc non Af Amer: >60 mL/min (ref >60)
Glucose, Bld: 125 mg/dL — ABNORMAL HIGH (ref 70–99)
POTASSIUM: 3.7 mmol/L (ref 3.5–5.1)
SODIUM: 136 mmol/L (ref 135–145)
Total Bilirubin: 0.6 mg/dL (ref 0.3–1.2)
Total Protein: 7.3 g/dL (ref 6.5–8.1)

## 2018-05-09 LAB — CBC
HEMATOCRIT: 41.6 % (ref 36.0–46.0)
Hemoglobin: 13.6 g/dL (ref 12.0–15.0)
MCH: 29 pg (ref 26.0–34.0)
MCHC: 32.7 g/dL (ref 30.0–36.0)
MCV: 88.7 fL (ref 78.0–100.0)
PLATELETS: 237 10*3/uL (ref 150–400)
RBC: 4.69 MIL/uL (ref 3.87–5.11)
RDW: 12.8 % (ref 11.5–15.5)
WBC: 14.6 10*3/uL — ABNORMAL HIGH (ref 4.0–10.5)

## 2018-05-09 NOTE — ED Triage Notes (Signed)
Pt c/o RLQ pain onset this morning that has not improved. Reports nausea and decreased PO intake

## 2018-05-10 ENCOUNTER — Observation Stay (HOSPITAL_COMMUNITY): Payer: BLUE CROSS/BLUE SHIELD | Admitting: Anesthesiology

## 2018-05-10 ENCOUNTER — Encounter (HOSPITAL_COMMUNITY): Payer: Self-pay | Admitting: Orthopedic Surgery

## 2018-05-10 ENCOUNTER — Encounter (HOSPITAL_COMMUNITY)
Admission: EM | Disposition: A | Discharge status: Home / Self Care | Payer: Self-pay | Source: Home / Self Care | Attending: Emergency Medicine

## 2018-05-10 ENCOUNTER — Other Ambulatory Visit: Payer: Self-pay

## 2018-05-10 ENCOUNTER — Emergency Department (HOSPITAL_COMMUNITY): Payer: BLUE CROSS/BLUE SHIELD

## 2018-05-10 DIAGNOSIS — K3580 Unspecified acute appendicitis: Secondary | ICD-10-CM | POA: Diagnosis not present

## 2018-05-10 DIAGNOSIS — R1031 Right lower quadrant pain: Secondary | ICD-10-CM | POA: Diagnosis not present

## 2018-05-10 DIAGNOSIS — K37 Unspecified appendicitis: Secondary | ICD-10-CM | POA: Diagnosis present

## 2018-05-10 HISTORY — PX: LAPAROSCOPIC APPENDECTOMY: SHX408

## 2018-05-10 LAB — URINALYSIS, ROUTINE W REFLEX MICROSCOPIC
Bilirubin Urine: NEGATIVE
GLUCOSE, UA: NEGATIVE mg/dL
Ketones, ur: 5 mg/dL — AB
NITRITE: NEGATIVE
Protein, ur: NEGATIVE mg/dL
Specific Gravity, Urine: 1.018 (ref 1.005–1.030)
pH: 6 (ref 5.0–8.0)

## 2018-05-10 LAB — SURGICAL PCR SCREEN
MRSA, PCR: NEGATIVE
Staphylococcus aureus: NEGATIVE

## 2018-05-10 IMAGING — CT CT ABD-PELV W/ CM
2 of 5 series · 16 of 46 positions shown, 18 images · IV contrast (APPLIED)
Comparison: None.

CLINICAL DATA: Right lower quadrant pain

EXAM:
CT ABDOMEN AND PELVIS WITH CONTRAST
TECHNIQUE: Multidetector CT imaging of the abdomen and pelvis was performed
using the standard protocol following bolus administration of
intravenous contrast.
CONTRAST:  100mL OMNIPAQUE IOHEXOL 300 MG/ML  SOLN

[Series 3: abdomen 5.0 · axial · 0.67mm/px · z∈[-718,-318]mm · 13 of 92 slices shown, 15 images]
[im 6/92  soft-tissue]
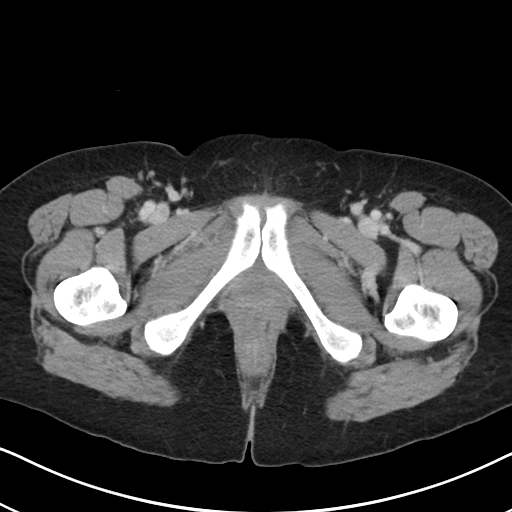
[im 6/92  bone]
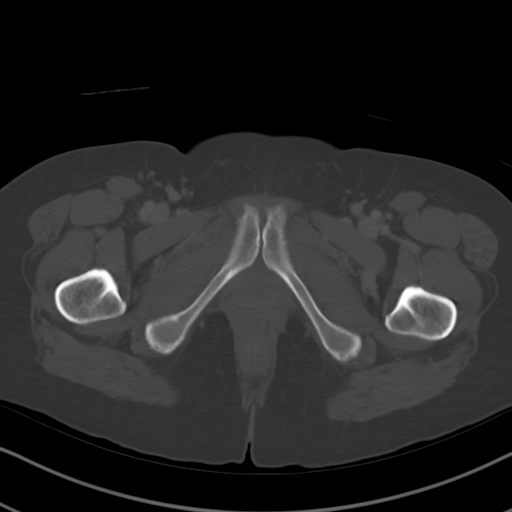
[im 12/92  soft-tissue]
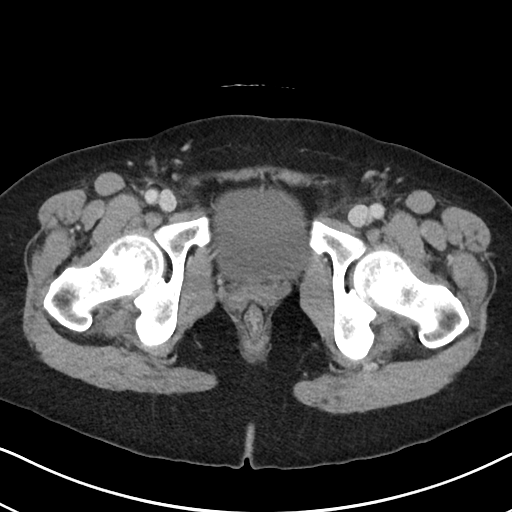
[im 18/92  soft-tissue]
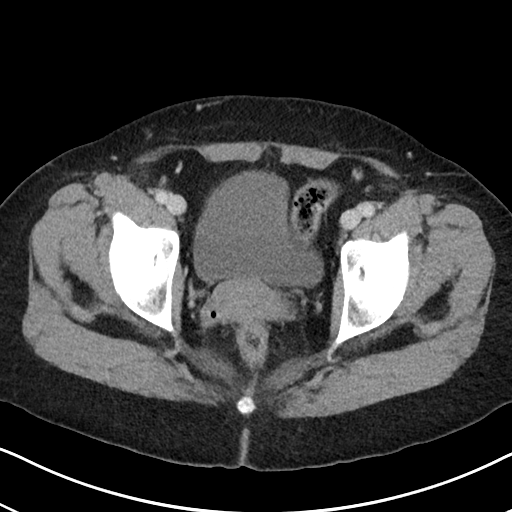
[im 29/92  soft-tissue]
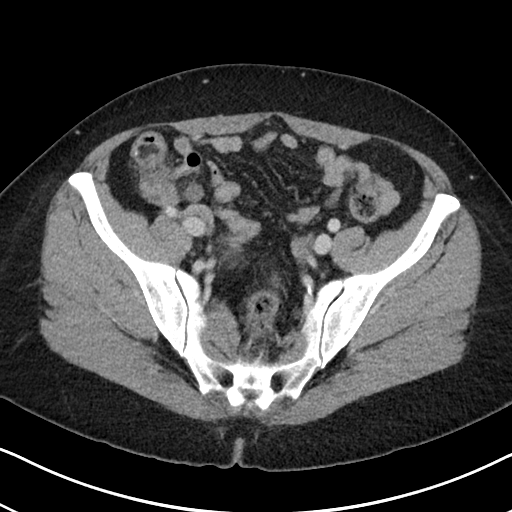
[im 35/92  soft-tissue]
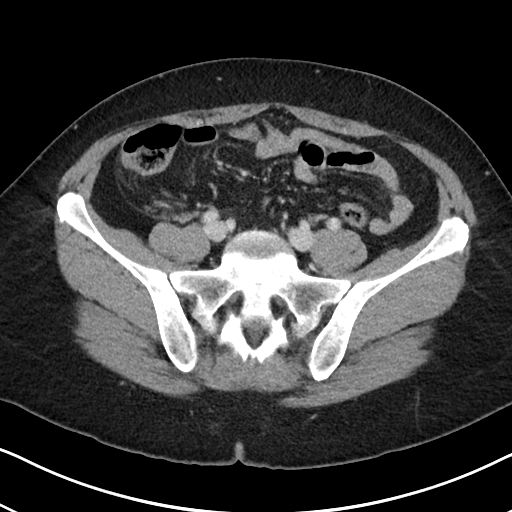
[im 40/92  soft-tissue]
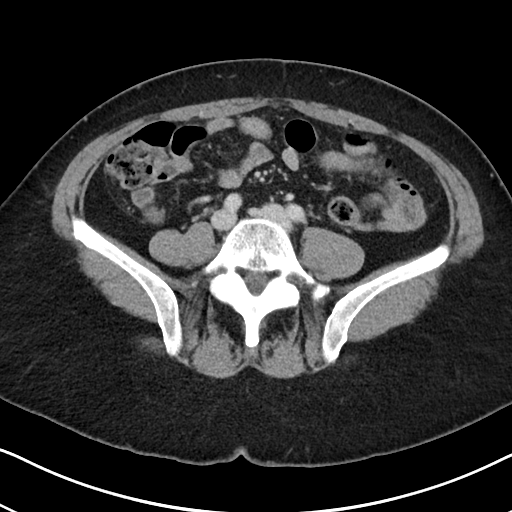
[im 46/92  soft-tissue]
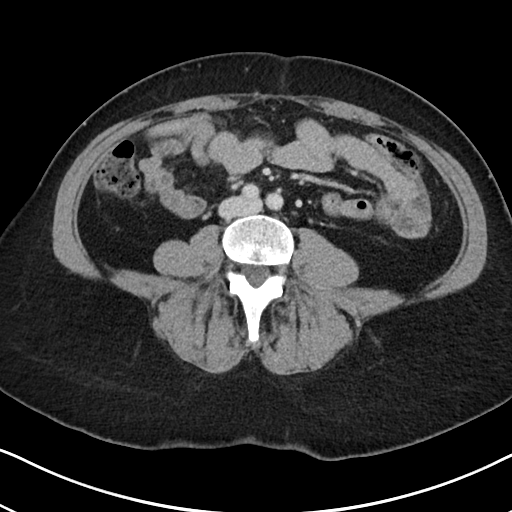
[im 52/92  soft-tissue]
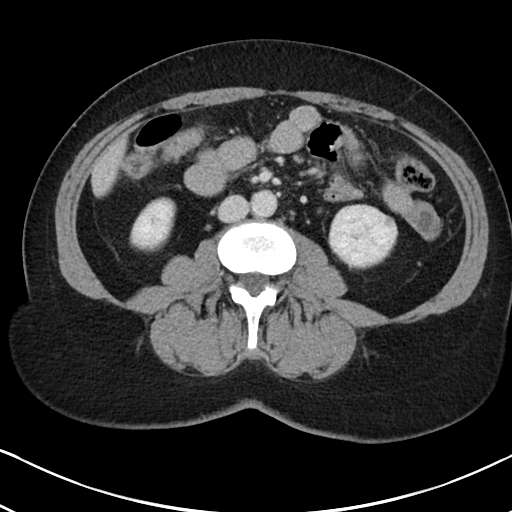
[im 57/92  soft-tissue]
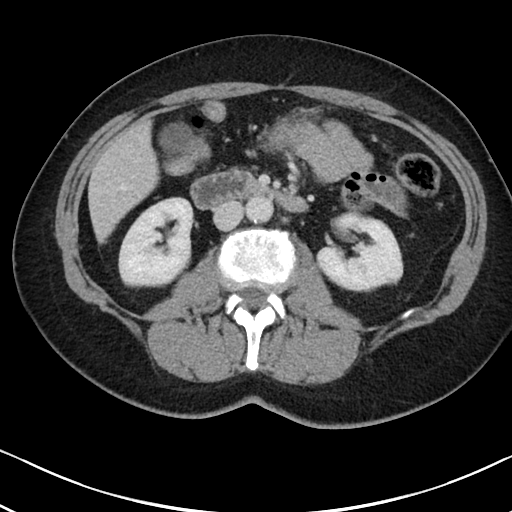
[im 57/92  bone]
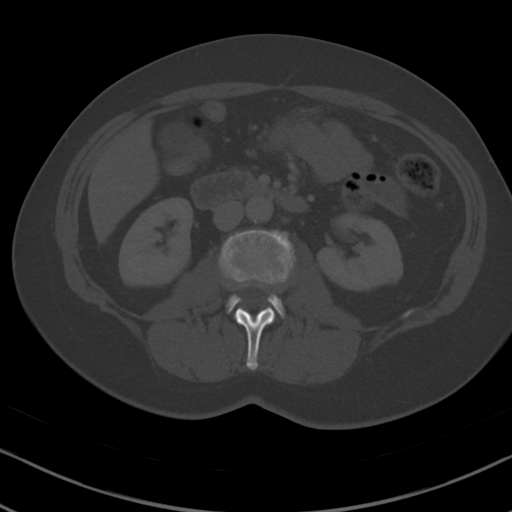
[im 63/92  soft-tissue]
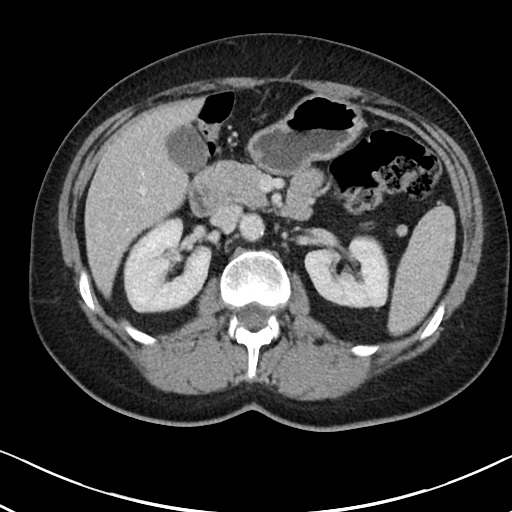
[im 74/92  soft-tissue]
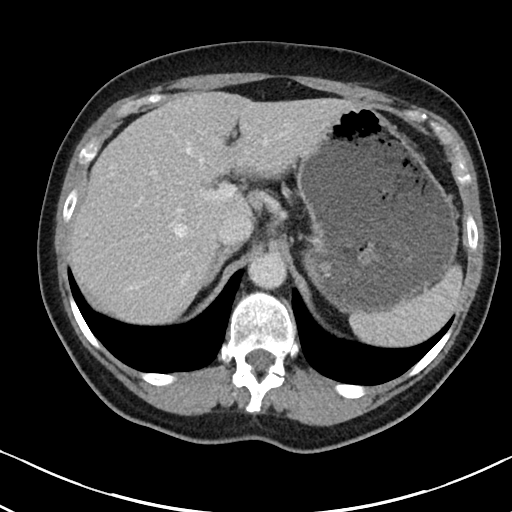
[im 80/92  soft-tissue]
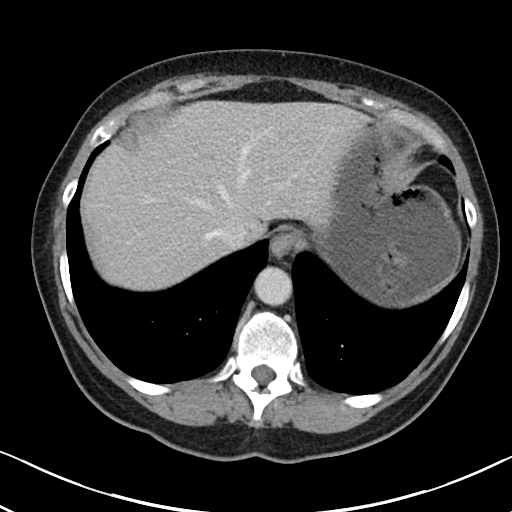
[im 86/92  soft-tissue]
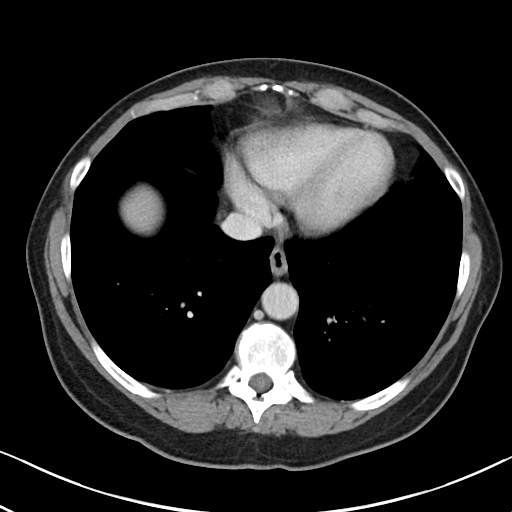

[Series 6: abdomen 3.0 mpr cor · coronal · 0.68mm/px · 3 of 90 slices shown]
[im 30/90  soft-tissue]
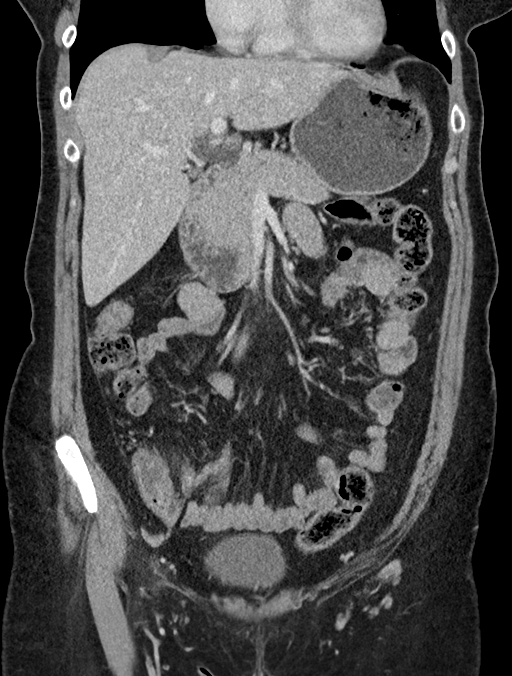
[im 40/90  soft-tissue]
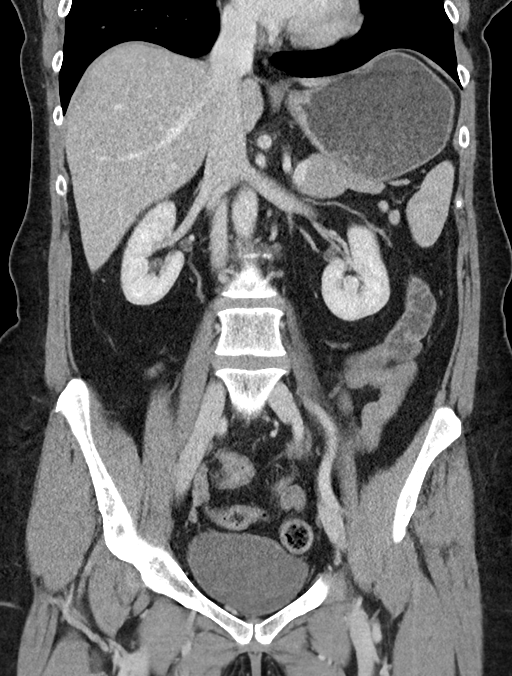
[im 50/90  soft-tissue]
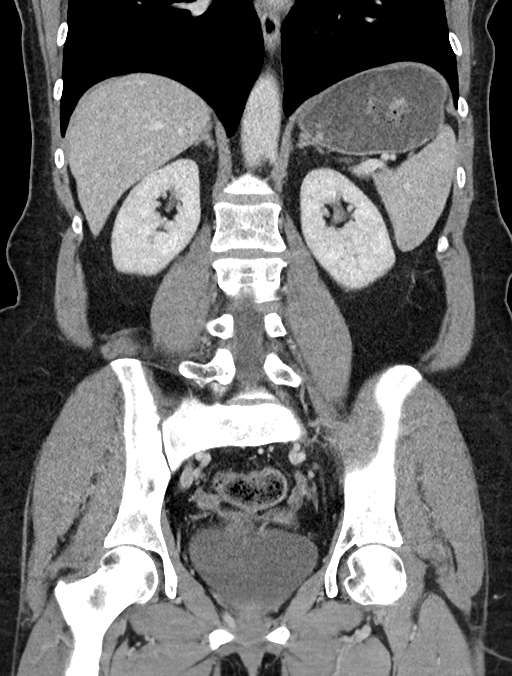

[16 of 46 positions shown; findings below may reference images not displayed]

FINDINGS: Lower chest: No acute abnormality.

Hepatobiliary: Subcentimeter hypodensity within the left lobe too
small to further characterize. No calcified gallstone or biliary
dilatation

Pancreas: Unremarkable. No pancreatic ductal dilatation or
surrounding inflammatory changes.

Spleen: Normal in size without focal abnormality.

Adrenals/Urinary Tract: Adrenal glands are unremarkable. Kidneys are
normal, without renal calculi, focal lesion, or hydronephrosis.
Bladder is unremarkable.

Stomach/Bowel: Stomach is nonenlarged. No dilated small bowel. No
colon wall thickening. Enlarged appendix measuring up to 1 cm. Mild
surrounding inflammatory changes.

Vascular/Lymphatic: No significant vascular findings are present. No
enlarged abdominal or pelvic lymph nodes.

Reproductive: Hyperenhancing nodule left uterine wall, probably a
fibroid. No adnexal mass

Other: Negative for free air or free fluid

Musculoskeletal: No acute or significant osseous findings.
IMPRESSION: 1. Findings consistent with acute non perforated appendicitis

Appendix: Location: Right lower quadrant

Diameter: 1 cm

Appendicolith: Possible small calcification within the distal lumen
on coronal views, series 6, image number 31

Mucosal hyper-enhancement: Not seen

Extraluminal gas: Negative

Periappendiceal collection: Negative

2.     Probable small fibroid in the uterus

## 2018-05-10 SURGERY — APPENDECTOMY, LAPAROSCOPIC
Anesthesia: General

## 2018-05-10 MED ORDER — MORPHINE SULFATE (PF) 4 MG/ML IV SOLN
2.0000 mg | INTRAVENOUS | Status: DC | PRN
  Administered 2018-05-10 (×2): 2 mg via INTRAVENOUS
  Filled 2018-05-10 (×3): qty 1

## 2018-05-10 MED ORDER — CEFTRIAXONE SODIUM 2 G IJ SOLR
2.0000 g | Freq: Once | INTRAMUSCULAR | Status: AC
  Administered 2018-05-10: 2 g via INTRAVENOUS
  Filled 2018-05-10: qty 20

## 2018-05-10 MED ORDER — CELECOXIB 200 MG PO CAPS
200.0000 mg | ORAL_CAPSULE | ORAL | Status: AC
  Administered 2018-05-10: 200 mg via ORAL
  Filled 2018-05-10 (×2): qty 1

## 2018-05-10 MED ORDER — LIDOCAINE 2% (20 MG/ML) 5 ML SYRINGE
INTRAMUSCULAR | Status: AC
  Filled 2018-05-10: qty 5

## 2018-05-10 MED ORDER — ROCURONIUM BROMIDE 10 MG/ML (PF) SYRINGE
PREFILLED_SYRINGE | INTRAVENOUS | Status: DC | PRN
  Administered 2018-05-10: 30 mg via INTRAVENOUS

## 2018-05-10 MED ORDER — LIDOCAINE 2% (20 MG/ML) 5 ML SYRINGE
INTRAMUSCULAR | Status: DC | PRN
  Administered 2018-05-10: 80 mg via INTRAVENOUS

## 2018-05-10 MED ORDER — ONDANSETRON HCL 4 MG/2ML IJ SOLN
INTRAMUSCULAR | Status: DC | PRN
  Administered 2018-05-10: 4 mg via INTRAVENOUS

## 2018-05-10 MED ORDER — PHENYLEPHRINE HCL 10 MG/ML IJ SOLN
INTRAMUSCULAR | Status: DC | PRN
  Administered 2018-05-10 (×2): 80 ug via INTRAVENOUS

## 2018-05-10 MED ORDER — HYDROMORPHONE HCL 1 MG/ML IJ SOLN: 0.2500 mg | INTRAMUSCULAR | Status: DC | PRN

## 2018-05-10 MED ORDER — OXYCODONE HCL 5 MG PO TABS: 5.0000 mg | ORAL_TABLET | Freq: Once | ORAL | Status: DC | PRN

## 2018-05-10 MED ORDER — EPHEDRINE SULFATE 50 MG/ML IJ SOLN
INTRAMUSCULAR | Status: DC | PRN
  Administered 2018-05-10: 5 mg via INTRAVENOUS

## 2018-05-10 MED ORDER — PROPOFOL 10 MG/ML IV BOLUS
INTRAVENOUS | Status: DC | PRN
  Administered 2018-05-10: 100 mg via INTRAVENOUS

## 2018-05-10 MED ORDER — FENTANYL CITRATE (PF) 250 MCG/5ML IJ SOLN
INTRAMUSCULAR | Status: AC
  Filled 2018-05-10: qty 5

## 2018-05-10 MED ORDER — ONDANSETRON HCL 4 MG/2ML IJ SOLN
INTRAMUSCULAR | Status: AC
  Filled 2018-05-10: qty 2

## 2018-05-10 MED ORDER — IBUPROFEN 200 MG PO TABS: 400.0000 mg | ORAL_TABLET | Freq: Three times a day (TID) | ORAL | 0 refills | Status: AC | PRN

## 2018-05-10 MED ORDER — ENOXAPARIN SODIUM 40 MG/0.4ML ~~LOC~~ SOLN: 40.0000 mg | SUBCUTANEOUS | Status: DC

## 2018-05-10 MED ORDER — FENTANYL CITRATE (PF) 250 MCG/5ML IJ SOLN
INTRAMUSCULAR | Status: DC | PRN
  Administered 2018-05-10 (×2): 50 ug via INTRAVENOUS

## 2018-05-10 MED ORDER — SODIUM CHLORIDE 0.9 % IV SOLN
INTRAVENOUS | Status: DC
  Administered 2018-05-10 (×2): via INTRAVENOUS

## 2018-05-10 MED ORDER — ONDANSETRON HCL 4 MG/2ML IJ SOLN: 4.0000 mg | Freq: Four times a day (QID) | INTRAMUSCULAR | Status: DC | PRN

## 2018-05-10 MED ORDER — BUPIVACAINE-EPINEPHRINE (PF) 0.5% -1:200000 IJ SOLN
INTRAMUSCULAR | Status: AC
  Filled 2018-05-10: qty 30

## 2018-05-10 MED ORDER — ACETAMINOPHEN 500 MG PO TABS
1000.0000 mg | ORAL_TABLET | ORAL | Status: AC
  Administered 2018-05-10: 1000 mg via ORAL
  Filled 2018-05-10: qty 2

## 2018-05-10 MED ORDER — FENTANYL CITRATE (PF) 100 MCG/2ML IJ SOLN: 25.0000 ug | INTRAMUSCULAR | Status: DC | PRN

## 2018-05-10 MED ORDER — MIDAZOLAM HCL 2 MG/2ML IJ SOLN
INTRAMUSCULAR | Status: DC | PRN
  Administered 2018-05-10 (×2): 1 mg via INTRAVENOUS

## 2018-05-10 MED ORDER — KETOROLAC TROMETHAMINE 30 MG/ML IJ SOLN
INTRAMUSCULAR | Status: DC | PRN
  Administered 2018-05-10: 30 mg via INTRAVENOUS

## 2018-05-10 MED ORDER — DEXAMETHASONE SODIUM PHOSPHATE 10 MG/ML IJ SOLN
INTRAMUSCULAR | Status: AC
  Filled 2018-05-10: qty 1

## 2018-05-10 MED ORDER — SODIUM CHLORIDE 0.9 % IV BOLUS
1000.0000 mL | Freq: Once | INTRAVENOUS | Status: AC
  Administered 2018-05-10: 1000 mL via INTRAVENOUS

## 2018-05-10 MED ORDER — ACETAMINOPHEN 325 MG PO TABS: 650.0000 mg | ORAL_TABLET | ORAL | Status: DC

## 2018-05-10 MED ORDER — ROCURONIUM BROMIDE 10 MG/ML (PF) SYRINGE
PREFILLED_SYRINGE | INTRAVENOUS | Status: AC
  Filled 2018-05-10: qty 10

## 2018-05-10 MED ORDER — HYDROMORPHONE HCL 1 MG/ML IJ SOLN
0.5000 mg | Freq: Once | INTRAMUSCULAR | Status: AC
  Administered 2018-05-10: 0.5 mg via INTRAVENOUS
  Filled 2018-05-10: qty 1

## 2018-05-10 MED ORDER — LACTATED RINGERS IV SOLN
INTRAVENOUS | Status: DC
  Administered 2018-05-10: 10:00:00 via INTRAVENOUS

## 2018-05-10 MED ORDER — SODIUM CHLORIDE 0.9 % IV SOLN
INTRAVENOUS | Status: DC | PRN
  Administered 2018-05-10: 25 ug/min via INTRAVENOUS

## 2018-05-10 MED ORDER — BUPIVACAINE-EPINEPHRINE 0.5% -1:200000 IJ SOLN
INTRAMUSCULAR | Status: DC | PRN
  Administered 2018-05-10: 9 mL

## 2018-05-10 MED ORDER — EPHEDRINE SULFATE 50 MG/ML IJ SOLN
INTRAMUSCULAR | Status: AC
  Filled 2018-05-10: qty 2

## 2018-05-10 MED ORDER — ACETAMINOPHEN 325 MG PO TABS: 650.0000 mg | ORAL_TABLET | Freq: Four times a day (QID) | ORAL | Status: DC | PRN

## 2018-05-10 MED ORDER — MEPERIDINE HCL 50 MG/ML IJ SOLN: 6.2500 mg | INTRAMUSCULAR | Status: DC | PRN

## 2018-05-10 MED ORDER — MUPIROCIN 2 % EX OINT
1.0000 "application " | TOPICAL_OINTMENT | Freq: Two times a day (BID) | CUTANEOUS | Status: DC
  Administered 2018-05-10: 1 via NASAL
  Filled 2018-05-10: qty 22

## 2018-05-10 MED ORDER — PHENYLEPHRINE 40 MCG/ML (10ML) SYRINGE FOR IV PUSH (FOR BLOOD PRESSURE SUPPORT)
PREFILLED_SYRINGE | INTRAVENOUS | Status: AC
  Filled 2018-05-10: qty 10

## 2018-05-10 MED ORDER — PROPOFOL 10 MG/ML IV BOLUS
INTRAVENOUS | Status: AC
  Filled 2018-05-10: qty 20

## 2018-05-10 MED ORDER — 0.9 % SODIUM CHLORIDE (POUR BTL) OPTIME
TOPICAL | Status: DC | PRN
  Administered 2018-05-10: 1000 mL

## 2018-05-10 MED ORDER — OXYCODONE HCL 5 MG PO TABS: 10.0000 mg | ORAL_TABLET | ORAL | Status: DC | PRN

## 2018-05-10 MED ORDER — SUGAMMADEX SODIUM 200 MG/2ML IV SOLN
INTRAVENOUS | Status: AC
  Filled 2018-05-10: qty 2

## 2018-05-10 MED ORDER — GABAPENTIN 300 MG PO CAPS
300.0000 mg | ORAL_CAPSULE | ORAL | Status: AC
  Administered 2018-05-10: 300 mg via ORAL
  Filled 2018-05-10: qty 1

## 2018-05-10 MED ORDER — FENTANYL CITRATE (PF) 100 MCG/2ML IJ SOLN
50.0000 ug | Freq: Once | INTRAMUSCULAR | Status: AC
  Administered 2018-05-10: 50 ug via INTRAVENOUS
  Filled 2018-05-10: qty 2

## 2018-05-10 MED ORDER — DEXAMETHASONE SODIUM PHOSPHATE 10 MG/ML IJ SOLN
INTRAMUSCULAR | Status: DC | PRN
  Administered 2018-05-10: 10 mg via INTRAVENOUS

## 2018-05-10 MED ORDER — SODIUM CHLORIDE 0.9 % IR SOLN
Status: DC | PRN
  Administered 2018-05-10: 1000 mL

## 2018-05-10 MED ORDER — ACETAMINOPHEN 325 MG PO TABS: 1000.0000 mg | ORAL_TABLET | Freq: Four times a day (QID) | ORAL | Status: DC | PRN

## 2018-05-10 MED ORDER — IOHEXOL 300 MG/ML  SOLN
100.0000 mL | Freq: Once | INTRAMUSCULAR | Status: AC | PRN
  Administered 2018-05-10: 100 mL via INTRAVENOUS

## 2018-05-10 MED ORDER — SODIUM CHLORIDE 0.9 % IV SOLN
Freq: Once | INTRAVENOUS | Status: AC
  Administered 2018-05-10: 03:00:00 via INTRAVENOUS

## 2018-05-10 MED ORDER — ONDANSETRON 4 MG PO TBDP: 4.0000 mg | ORAL_TABLET | Freq: Four times a day (QID) | ORAL | Status: DC | PRN

## 2018-05-10 MED ORDER — SUGAMMADEX SODIUM 200 MG/2ML IV SOLN
INTRAVENOUS | Status: DC | PRN
  Administered 2018-05-10: 200 mg via INTRAVENOUS

## 2018-05-10 MED ORDER — OXYCODONE HCL 5 MG/5ML PO SOLN: 5.0000 mg | Freq: Once | ORAL | Status: DC | PRN

## 2018-05-10 MED ORDER — OXYCODONE HCL 5 MG PO TABS: 5.0000 mg | ORAL_TABLET | Freq: Four times a day (QID) | ORAL | 0 refills | Status: DC | PRN

## 2018-05-10 MED ORDER — PIPERACILLIN-TAZOBACTAM 3.375 G IVPB: 3.3750 g | Freq: Three times a day (TID) | INTRAVENOUS | Status: DC

## 2018-05-10 MED ORDER — MIDAZOLAM HCL 10 MG/2ML IJ SOLN
INTRAMUSCULAR | Status: AC
  Filled 2018-05-10: qty 2

## 2018-05-10 MED ORDER — PROMETHAZINE HCL 25 MG/ML IJ SOLN: 6.2500 mg | INTRAMUSCULAR | Status: DC | PRN

## 2018-05-10 MED ORDER — METRONIDAZOLE IN NACL 5-0.79 MG/ML-% IV SOLN
500.0000 mg | Freq: Once | INTRAVENOUS | Status: AC
  Administered 2018-05-10: 500 mg via INTRAVENOUS
  Filled 2018-05-10: qty 100

## 2018-05-10 MED ORDER — PIPERACILLIN-TAZOBACTAM 3.375 G IVPB 30 MIN: 3.3750 g | Freq: Once | INTRAVENOUS | Status: DC

## 2018-05-10 MED ORDER — ONDANSETRON HCL 4 MG/2ML IJ SOLN: 4.0000 mg | Freq: Once | INTRAMUSCULAR | Status: DC | PRN

## 2018-05-10 MED ORDER — KETOROLAC TROMETHAMINE 30 MG/ML IJ SOLN
INTRAMUSCULAR | Status: AC
  Filled 2018-05-10: qty 1

## 2018-05-10 SURGICAL SUPPLY — 46 items
APPLIER CLIP ROT 10 11.4 M/L (STAPLE)
BLADE CLIPPER SURG (BLADE) IMPLANT
CANISTER SUCT 3000ML PPV (MISCELLANEOUS) ×2 IMPLANT
CHLORAPREP W/TINT 26ML (MISCELLANEOUS) ×2 IMPLANT
CLIP APPLIE ROT 10 11.4 M/L (STAPLE) IMPLANT
CONT SPEC 4OZ CLIKSEAL STRL BL (MISCELLANEOUS) ×2 IMPLANT
COVER SURGICAL LIGHT HANDLE (MISCELLANEOUS) ×2 IMPLANT
CUTTER FLEX LINEAR 45M (STAPLE) ×2 IMPLANT
DERMABOND ADVANCED (GAUZE/BANDAGES/DRESSINGS) ×1
DERMABOND ADVANCED .7 DNX12 (GAUZE/BANDAGES/DRESSINGS) ×1 IMPLANT
ELECT REM PT RETURN 9FT ADLT (ELECTROSURGICAL) ×2
ELECTRODE REM PT RTRN 9FT ADLT (ELECTROSURGICAL) ×1 IMPLANT
ENDOLOOP SUT PDS II  0 18 (SUTURE)
ENDOLOOP SUT PDS II 0 18 (SUTURE) IMPLANT
GLOVE BIOGEL M 6.5 STRL (GLOVE) ×4 IMPLANT
GLOVE BIOGEL PI IND STRL 6.5 (GLOVE) ×1 IMPLANT
GLOVE BIOGEL PI IND STRL 8 (GLOVE) ×2 IMPLANT
GLOVE BIOGEL PI INDICATOR 6.5 (GLOVE) ×1
GLOVE BIOGEL PI INDICATOR 8 (GLOVE) ×2
GLOVE ECLIPSE 7.5 STRL STRAW (GLOVE) ×2 IMPLANT
GLOVE ECLIPSE 8.0 STRL XLNG CF (GLOVE) ×4 IMPLANT
GOWN STRL REUS W/ TWL LRG LVL3 (GOWN DISPOSABLE) ×2 IMPLANT
GOWN STRL REUS W/ TWL XL LVL3 (GOWN DISPOSABLE) ×1 IMPLANT
GOWN STRL REUS W/TWL 2XL LVL3 (GOWN DISPOSABLE) ×4 IMPLANT
GOWN STRL REUS W/TWL LRG LVL3 (GOWN DISPOSABLE) ×2
GOWN STRL REUS W/TWL XL LVL3 (GOWN DISPOSABLE) ×1
KIT BASIN OR (CUSTOM PROCEDURE TRAY) ×2 IMPLANT
KIT TURNOVER KIT B (KITS) ×2 IMPLANT
NS IRRIG 1000ML POUR BTL (IV SOLUTION) ×2 IMPLANT
PAD ARMBOARD 7.5X6 YLW CONV (MISCELLANEOUS) ×4 IMPLANT
POUCH RETRIEVAL ECOSAC 10 (ENDOMECHANICALS) ×1 IMPLANT
POUCH RETRIEVAL ECOSAC 10MM (ENDOMECHANICALS) ×1
RELOAD STAPLE TA45 3.5 REG BLU (ENDOMECHANICALS) ×2 IMPLANT
SET IRRIG TUBING LAPAROSCOPIC (IRRIGATION / IRRIGATOR) ×2 IMPLANT
SHEARS HARMONIC ACE PLUS 36CM (ENDOMECHANICALS) ×2 IMPLANT
SLEEVE ENDOPATH XCEL 5M (ENDOMECHANICALS) ×2 IMPLANT
SUT MON AB 5-0 P3 18 (SUTURE) ×2 IMPLANT
TOWEL OR 17X24 6PK STRL BLUE (TOWEL DISPOSABLE) ×2 IMPLANT
TOWEL OR 17X26 10 PK STRL BLUE (TOWEL DISPOSABLE) ×2 IMPLANT
TRAY FOLEY W/BAG SLVR 16FR (SET/KITS/TRAYS/PACK) ×1
TRAY FOLEY W/BAG SLVR 16FR ST (SET/KITS/TRAYS/PACK) ×1 IMPLANT
TRAY LAPAROSCOPIC MC (CUSTOM PROCEDURE TRAY) ×2 IMPLANT
TROCAR XCEL BLUNT TIP 100MML (ENDOMECHANICALS) ×2 IMPLANT
TROCAR XCEL NON-BLD 5MMX100MML (ENDOMECHANICALS) ×2 IMPLANT
TUBING INSUFFLATION (TUBING) ×2 IMPLANT
WATER STERILE IRR 1000ML POUR (IV SOLUTION) ×2 IMPLANT

## 2018-05-10 NOTE — Plan of Care (Signed)

## 2018-05-10 NOTE — Anesthesia Procedure Notes (Signed)
Procedure Name: Intubation Date/Time: 05/10/2018 10:47 AM Performed by: Clearnce Sorrel, CRNA Pre-anesthesia Checklist: Patient identified, Emergency Drugs available, Suction available, Patient being monitored and Timeout performed Patient Re-evaluated:Patient Re-evaluated prior to induction Oxygen Delivery Method: Circle system utilized Preoxygenation: Pre-oxygenation with 100% oxygen Induction Type: IV induction Ventilation: Mask ventilation without difficulty Laryngoscope Size: Mac and 3 Grade View: Grade I Tube type: Oral Number of attempts: 1 Airway Equipment and Method: Stylet Placement Confirmation: ETT inserted through vocal cords under direct vision,  positive ETCO2 and breath sounds checked- equal and bilateral Secured at: 22 cm Tube secured with: Tape Dental Injury: Teeth and Oropharynx as per pre-operative assessment

## 2018-05-10 NOTE — H&P (Signed)
Lisa Gallegos is an 62 y.o. female.   Chief Complaint: abd pain HPI: 89 yof with 24 hours of abd pain in rlq. Nothing making it better at home.  No n/v, no fevers, normal bowel function. Has normal colonoscopy at about age 25 and told 10 years for next one.  Pain worse with movement.  She was admitted to er where she was found to have elevated wbc and ct with appendicitis. I was consulted.   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   History reviewed. No pertinent past medical history.  Past Surgical History:  Procedure Laterality Date  . CESAREAN SECTION  1985   FTP  . KIDNEY STONE SURGERY     laser surgery    Family History  Problem Relation Age of Onset  . Cancer Mother        Pancreatic cancer   . Heart disease Father    Social History:  reports that she has never smoked. She has never used smokeless tobacco. She reports that she drinks alcohol. She reports that she does not use drugs.  Allergies: No Known Allergies  Medications Prior to Admission  Medication Sig Dispense Refill  . buPROPion (WELLBUTRIN XL) 150 MG 24 hr tablet Start 150 mg ER PO qam, increase after 3 days to two tablets (351m) po qam. (Patient not taking: Reported on 05/10/2018) 120 tablet 3  . hydrOXYzine (ATARAX/VISTARIL) 25 MG tablet PLEASE SEE ATTACHED FOR DETAILED DIRECTIONS (Patient not taking: Reported on 05/10/2018) 30 tablet 2    Results for orders placed or performed during the hospital encounter of 05/09/18 (from the past 48 hour(s))  Comprehensive metabolic panel     Status: Abnormal   Collection Time: 05/09/18 10:19 PM  Result Value Ref Range   Sodium 136 135 - 145 mmol/L   Potassium 3.7 3.5 - 5.1 mmol/L   Chloride 102 98 - 111 mmol/L   CO2 24 22 - 32 mmol/L   Glucose, Bld 125 (H) 70 - 99 mg/dL   BUN 20 6 - 20 mg/dL   Creatinine, Ser 0.96 0.44 - 1.00 mg/dL   Calcium 9.2 8.9 - 10.3 mg/dL   Total Protein 7.3 6.5 - 8.1 g/dL   Albumin 4.3 3.5 - 5.0 g/dL   AST 21 15 - 41 U/L   ALT 17 0 - 44  U/L   Alkaline Phosphatase 81 38 - 126 U/L   Total Bilirubin 0.6 0.3 - 1.2 mg/dL   GFR calc non Af Amer >60 >60 mL/min   GFR calc Af Amer >60 >60 mL/min    Comment: (NOTE) The eGFR has been calculated using the CKD EPI equation. This calculation has not been validated in all clinical situations. eGFR's persistently <60 mL/min signify possible Chronic Kidney Disease.    Anion gap 10 5 - 15    Comment: [facility]formed [facility], [place] [id number] CBC     Status: Abnormal   Collection Time: 05/09/18 10:19 PM  Result Value Ref Range   WBC 14.6 (H) 4.0 - 10.5 K/uL   RBC 4.69 3.87 - 5.11 MIL/uL   Hemoglobin 13.6 12.0 - 15.0 g/dL   HCT 41.6 36.0 - 46.0 %   MCV 88.7 78.0 - 100.0 fL   MCH 29.0 26.0 - 34.0 pg   MCHC 32.7 30.0 - 36.0 g/dL   RDW 12.8 11.5 - 15.5 %   Platelets 237 150 - 400 K/uL    Comment: Performed at [facility], [address], [place], [place] [address]  Urinalysis, Routine w reflex microscopic     Status: Abnormal   Collection Time: 05/10/18  2:54 AM  Result Value Ref Range   Color, Urine STRAW (A) YELLOW   APPearance CLEAR CLEAR   Specific Gravity, Urine 1.018 1.005 - 1.030   pH 6.0 5.0 - 8.0   Glucose, UA NEGATIVE NEGATIVE mg/dL   Hgb urine dipstick SMALL (A) NEGATIVE   Bilirubin Urine NEGATIVE NEGATIVE   Ketones, ur 5 (A) NEGATIVE mg/dL   Protein, ur NEGATIVE NEGATIVE mg/dL   Nitrite NEGATIVE NEGATIVE   Leukocytes, UA LARGE (A) NEGATIVE   RBC / HPF 0-5 0 - 5 RBC/hpf   WBC, UA >50 (H) 0 - 5 WBC/hpf   Bacteria, UA RARE (A) NONE SEEN   Squamous Epithelial / LPF 0-5 0 - 5   Mucus PRESENT    Non Squamous Epithelial 0-5 (A) NONE SEEN    Comment: Performed at [facility], [address] [place], [place] [address]   Ct Abdomen Pelvis W Contrast  Result Date: 05/10/2018 CLINICAL DATA:  Right lower quadrant pain EXAM: CT ABDOMEN AND PELVIS WITH CONTRAST TECHNIQUE: Multidetector CT imaging of the abdomen and pelvis was  performed using the standard protocol following bolus administration of intravenous contrast. CONTRAST:  172m OMNIPAQUE IOHEXOL 300 MG/ML  SOLN COMPARISON:  None. FINDINGS: Lower chest: No acute abnormality. Hepatobiliary: Subcentimeter hypodensity within the left lobe too small to further characterize. No calcified gallstone or biliary dilatation Pancreas: Unremarkable. No pancreatic ductal dilatation or surrounding inflammatory changes. Spleen: Normal in size without focal abnormality. Adrenals/Urinary Tract: Adrenal glands are unremarkable. Kidneys are normal, without renal calculi, focal lesion, or hydronephrosis. Bladder is unremarkable. Stomach/Bowel: Stomach is nonenlarged. No dilated small bowel. No colon wall thickening. Enlarged appendix measuring up to 1 cm. Mild surrounding inflammatory changes. Vascular/Lymphatic: No significant vascular findings are present. No enlarged abdominal or pelvic lymph nodes. Reproductive: Hyperenhancing nodule left uterine wall, probably a fibroid. No adnexal mass Other: Negative for free air or free fluid Musculoskeletal: No acute or significant osseous findings. IMPRESSION: 1. Findings consistent with acute non perforated appendicitis Appendix: Location: Right lower quadrant Diameter: 1 cm Appendicolith: Possible small calcification within the distal lumen on coronal views, series 6, image number 31 Mucosal hyper-enhancement: Not seen Extraluminal gas: Negative Periappendiceal collection: Negative 2.     Probable small fibroid in the uterus Electronically Signed   By: KDonavan FoilM.D.   On: 05/10/2018 01:48    Review of Systems  HENT: Positive for nosebleeds.   Gastrointestinal: Positive for abdominal pain.  All other systems reviewed and are negative.   Blood pressure 112/63, pulse 68, temperature 98.3 F (36.8 C), temperature source Oral, resp. rate 16, height '5\' 5"'  (1.651 m), weight 81.5 kg (179 lb 9.6 oz), last menstrual period 02/17/2012, SpO2 95  %. Physical Exam  Vitals reviewed. Constitutional: She is oriented to person, place, and time. She appears well-developed and well-nourished.  HENT:  Head: Normocephalic and atraumatic.  Eyes: No scleral icterus.  Neck: Neck supple.  Cardiovascular: Normal rate, regular rhythm and normal heart sounds.  Respiratory: Effort normal and breath sounds normal. She has no wheezes.  GI: Soft. There is tenderness in the right lower quadrant. No hernia.  Neurological: She is alert and oriented to person, place, and time.  Skin: Skin is warm and dry.  Psychiatric: She has a normal mood and affect. Her behavior is normal.     Assessment/Plan  Appendicitis  Has received antibiotics, npo, discussed treatment options including nonoperative therapy. Recommended lap appy. Discussed surgery and recovery. Will do eras medications and she might be able to go home later today as this appears nonperforated.  Rolm Bookbinder, MD 05/10/2018, 6:28 AM

## 2018-05-10 NOTE — Discharge Summary (Signed)
     Patient ID: Lisa Gallegos [id number] 03-11-1956 62 y.o.  Admit date: 05/09/2018 Discharge date: 05/10/2018  Admitting Diagnosis: Acute appendicitis  Discharge Diagnosis Patient Active Problem List   Diagnosis Date Noted  . Appendicitis 05/10/2018  . Situational anxiety 04/05/2018  . Vitamin D deficiency 04/05/2018  . Routine physical examination 12/27/2016  . Menopausal symptoms 03/24/2015  . Hyperlipidemia 03/24/2015  . Seborrheic keratoses 03/24/2015  . Weight gain 09/15/2014  . Elevated BP 09/15/2014    Consultants none  Reason for Admission: 20 yof with 24 hours of abd pain in rlq. Nothing making it better at home.  No n/v, no fevers, normal bowel function. Has normal colonoscopy at about age 79 and told 10 years for next one.  Pain worse with movement.  She was admitted to er where she was found to have elevated wbc and ct with appendicitis. I was consulted.   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   Procedures Lap appy by Dr. Excell Seltzer, 7/26  Hospital Course:  The patient was admitted and underwent a laparoscopic appendectomy.  The patient tolerated the procedure well.  On POD 0, the patient was tolerating a diet, voiding well, mobilizing, and pain was controlled with oral pain medications.  The patient was stable for DC home at this time with appropriate follow up made.    Physical Exam: Abd: soft, appropriately tender, +BS, incisions c/d/i  Allergies as of 05/10/2018   No Known Allergies     Medication List    STOP taking these medications   buPROPion 150 MG 24 hr tablet Commonly known as:  WELLBUTRIN XL   hydrOXYzine 25 MG tablet Commonly known as:  ATARAX/VISTARIL     TAKE these medications   acetaminophen 325 MG tablet Commonly known as:  TYLENOL Take 3 tablets (975 mg total) by mouth every 6 (six) hours as needed.   ibuprofen 200 MG tablet Commonly known as:  MOTRIN IB Take 2-4 tablets (400-800 mg total) by mouth every 8 (eight) hours as  needed.   oxyCODONE 5 MG immediate release tablet Commonly known as:  ROXICODONE Take 1 tablet (5 mg total) by mouth every 6 (six) hours as needed for severe pain.        Follow-up Information    Sur[facility], [place]. Go on 05/23/2018.   Specialty:  General Surgery Why:  Follow up appointment scheduled for 2:00 PM. Please arrive 30 min prior to appointment time. Bring photo ID and insurance information.  Contact information: [address] [place] [phone]           Signed: Saverio Danker, [facility] 05/10/2018, 3:49 PM Pager: [phone]

## 2018-05-10 NOTE — ED Notes (Signed)
Attempted report 

## 2018-05-10 NOTE — Anesthesia Postprocedure Evaluation (Signed)
Anesthesia Post Note  Patient: Lisa Gallegos  Procedure(s) Performed: APPENDECTOMY LAPAROSCOPIC (N/A )     Patient location during evaluation: PACU Anesthesia Type: General Level of consciousness: awake and alert Pain management: pain level controlled Vital Signs Assessment: post-procedure vital signs reviewed and stable Respiratory status: spontaneous breathing, nonlabored ventilation and respiratory function stable Cardiovascular status: blood pressure returned to baseline and stable Postop Assessment: no apparent nausea or vomiting Anesthetic complications: no    Last Vitals:  Vitals:   05/10/18 1244 05/10/18 1300  BP: (!) 101/52 104/69  Pulse: 67 73  Resp: 13 17  Temp:  36.9 C  SpO2: 94% 92%    Last Pain:  Vitals:   05/10/18 1300  TempSrc: Oral  PainSc:                  Lynda Rainwater

## 2018-05-10 NOTE — ED Provider Notes (Signed)
[facility] EMERGENCY DEPARTMENT Provider Note  CSN: [id number] Arrival date & time: 05/09/18 2202  Chief Complaint(s) Abdominal Pain  HPI Lisa Gallegos is a 62 y.o. female who presents to the emergency department with 1 day of right lower quadrant abdominal pain gradually worsened throughout the day.  Initially intermittent, now constant.  Moderate described as achiness.  Radiating to the growing.  Exacerbated with palpation.  Alleviated by immobility.   Associated with nausea without emesis.  Patient reports prior history of renal stones and feels that this might be similar but uncertain.  She denies any fevers or chills.  No chest pain or shortness of breath.  No diarrhea.  No urinary symptoms.  HPI  Past Medical History History reviewed. No pertinent past medical history. Patient Active Problem List   Diagnosis Date Noted  . Appendicitis 05/10/2018  . Situational anxiety 04/05/2018  . Vitamin D deficiency 04/05/2018  . Routine physical examination 12/27/2016  . Menopausal symptoms 03/24/2015  . Hyperlipidemia 03/24/2015  . Seborrheic keratoses 03/24/2015  . Weight gain 09/15/2014  . Elevated BP 09/15/2014   Home Medication(s) Prior to Admission medications   Medication Sig Start Date End Date Taking? Authorizing Provider  buPROPion (WELLBUTRIN XL) 150 MG 24 hr tablet Start 150 mg ER PO qam, increase after 3 days to two tablets (300mg ) po qam. Patient not taking: Reported on 05/10/2018 04/05/18   Burnard Hawthorne, FNP  hydrOXYzine (ATARAX/VISTARIL) 25 MG tablet PLEASE SEE ATTACHED FOR DETAILED DIRECTIONS Patient not taking: Reported on 05/10/2018 05/03/18   Burnard Hawthorne, FNP                                                                                                                                    Past Surgical History Past Surgical History:  Procedure Laterality Date  . CESAREAN SECTION  1985   FTP  . KIDNEY STONE SURGERY     laser surgery    Family History Family History  Problem Relation Age of Onset  . Cancer Mother        Pancreatic cancer   . Heart disease Father     Social History Social History   Tobacco Use  . Smoking status: Never Smoker  . Smokeless tobacco: Never Used  Substance Use Topics  . Alcohol use: Yes    Alcohol/week: 0.0 oz    Comment: couple drinks a week   . Drug use: No   Allergies Patient has no known allergies.  Review of Systems Review of Systems All other systems are reviewed and are negative for acute change except as noted in the HPI  Physical Exam Vital Signs  I have reviewed the triage vital signs BP 128/66   Pulse 76   Temp 99.3 F (37.4 C)   Resp 18   Ht 5\' 5"  (1.651 m)   Wt 77.1 kg (170 lb)   LMP 02/17/2012   SpO2 98%  BMI 28.29 kg/m   Physical Exam  Constitutional: She is oriented to person, place, and time. She appears well-developed and well-nourished. No distress.  HENT:  Head: Normocephalic and atraumatic.  Right Ear: External ear normal.  Left Ear: External ear normal.  Nose: Nose normal.  Eyes: Conjunctivae and EOM are normal. No scleral icterus.  Neck: Normal range of motion and phonation normal.  Cardiovascular: Normal rate and regular rhythm.  Pulmonary/Chest: Effort normal. No stridor. No respiratory distress.  Abdominal: She exhibits no distension. There is tenderness in the right lower quadrant. There is tenderness at McBurney's point. There is no rigidity, no rebound, no guarding and no CVA tenderness.  Negative obturators and Rovsing sign.  Musculoskeletal: Normal range of motion. She exhibits no edema.  Neurological: She is alert and oriented to person, place, and time.  Skin: She is not diaphoretic.  Psychiatric: She has a normal mood and affect. Her behavior is normal.  Vitals reviewed.   ED Results and Treatments Labs (all labs ordered are listed, but only abnormal results are displayed) Labs Reviewed  COMPREHENSIVE METABOLIC PANEL  - Abnormal; Notable for the following components:      Result Value   Glucose, Bld 125 (*)    All other components within normal limits  CBC - Abnormal; Notable for the following components:   WBC 14.6 (*)    All other components within normal limits  URINALYSIS, ROUTINE W REFLEX MICROSCOPIC - Abnormal; Notable for the following components:   Color, Urine STRAW (*)    Hgb urine dipstick SMALL (*)    Ketones, ur 5 (*)    Leukocytes, UA LARGE (*)    WBC, UA >50 (*)    Bacteria, UA RARE (*)    Non Squamous Epithelial 0-5 (*)    All other components within normal limits                                                                                                                         EKG  EKG Interpretation  Date/Time:    Ventricular Rate:    PR Interval:    QRS Duration:   QT Interval:    QTC Calculation:   R Axis:     Text Interpretation:        Radiology Ct Abdomen Pelvis W Contrast  Result Date: 05/10/2018 CLINICAL DATA:  Right lower quadrant pain EXAM: CT ABDOMEN AND PELVIS WITH CONTRAST TECHNIQUE: Multidetector CT imaging of the abdomen and pelvis was performed using the standard protocol following bolus administration of intravenous contrast. CONTRAST:  118mL OMNIPAQUE IOHEXOL 300 MG/ML  SOLN COMPARISON:  None. FINDINGS: Lower chest: No acute abnormality. Hepatobiliary: Subcentimeter hypodensity within the left lobe too small to further characterize. No calcified gallstone or biliary dilatation Pancreas: Unremarkable. No pancreatic ductal dilatation or surrounding inflammatory changes. Spleen: Normal in size without focal abnormality. Adrenals/Urinary Tract: Adrenal glands are unremarkable. Kidneys are normal, without renal calculi, focal lesion, or hydronephrosis. Bladder is unremarkable. Stomach/Bowel: Stomach is  nonenlarged. No dilated small bowel. No colon wall thickening. Enlarged appendix measuring up to 1 cm. Mild surrounding inflammatory changes. Vascular/Lymphatic:  No significant vascular findings are present. No enlarged abdominal or pelvic lymph nodes. Reproductive: Hyperenhancing nodule left uterine wall, probably a fibroid. No adnexal mass Other: Negative for free air or free fluid Musculoskeletal: No acute or significant osseous findings. IMPRESSION: 1. Findings consistent with acute non perforated appendicitis Appendix: Location: Right lower quadrant Diameter: 1 cm Appendicolith: Possible small calcification within the distal lumen on coronal views, series 6, image number 31 Mucosal hyper-enhancement: Not seen Extraluminal gas: Negative Periappendiceal collection: Negative 2.     Probable small fibroid in the uterus Electronically Signed   By: Donavan Foil M.D.   On: 05/10/2018 01:48   Pertinent labs & imaging results that were available during my care of the patient were reviewed by me and considered in my medical decision making (see chart for details).  Medications Ordered in ED Medications  cefTRIAXone (ROCEPHIN) 2 g in sodium chloride 0.9 % 100 mL IVPB (0 g Intravenous Stopped 05/10/18 0315)    And  metroNIDAZOLE (FLAGYL) IVPB 500 mg (500 mg Intravenous New Bag/Given 05/10/18 0332)  enoxaparin (LOVENOX) injection 40 mg (has no administration in time range)  0.9 %  sodium chloride infusion (has no administration in time range)  morphine 4 MG/ML injection 2 mg (has no administration in time range)  ondansetron (ZOFRAN-ODT) disintegrating tablet 4 mg (has no administration in time range)    Or  ondansetron (ZOFRAN) injection 4 mg (has no administration in time range)  gabapentin (NEURONTIN) capsule 300 mg (has no administration in time range)  acetaminophen (TYLENOL) tablet 1,000 mg (has no administration in time range)  celecoxib (CELEBREX) capsule 200 mg (has no administration in time range)  fentaNYL (SUBLIMAZE) injection 50 mcg (50 mcg Intravenous Given 05/10/18 0114)  iohexol (OMNIPAQUE) 300 MG/ML solution 100 mL (100 mLs Intravenous Contrast  Given 05/10/18 0134)  sodium chloride 0.9 % bolus 1,000 mL (0 mLs Intravenous Stopped 05/10/18 0236)  0.9 %  sodium chloride infusion ( Intravenous New Bag/Given 05/10/18 0243)  HYDROmorphone (DILAUDID) injection 0.5 mg (0.5 mg Intravenous Given 05/10/18 0331)                                                                                                                                    Procedures Procedures  (including critical care time)  Medical Decision Making / ED Course I have reviewed the nursing notes for this encounter and the patient's prior records (if available in EHR or on provided paperwork).    Right lower quadrant abdominal pain with tenderness at McBurney's point.  Labs with leukocytosis.  Otherwise grossly reassuring.  Possible renal colic versus appendicitis.  Given the leukocytosis will obtain a CT scan.  CT revealed evidence of appendicitis.    Patient provided with IV fluids, pain medicine and empiric antibiotics.  Discussed case with general  surgery who will admit the patient for continued management.    Final Clinical Impression(s) / ED Diagnoses Final diagnoses:  Appendicitis, acute, with peritonitis      This chart was dictated using voice recognition software.  Despite best efforts to proofread,  errors can occur which can change the documentation meaning.   Fatima Blank, MD 05/10/18 [phone]

## 2018-05-10 NOTE — Op Note (Signed)
Preoperative Diagnosis: Appendicitis, acute, with peritonitis [K35.33]  Postoprative Diagnosis: Appendicitis, acute, with peritonitis [K35.33]  Procedure: Procedure(s): APPENDECTOMY LAPAROSCOPIC   Surgeon: Excell Seltzer T   Assistants: None  Anesthesia:  General endotracheal anesthesia  Indications: Patient is a generally healthy female who presents with 24 hours of typical symptoms for appendicitis with localized tenderness and CT scan showing apparent uncomplicated appendicitis.  We have recommended proceeding with laparoscopic appendectomy.  I discussed the nature of the procedure and its indications as well as risks of anesthetic complications, bleeding, infection or visceral injury.  She understands and agrees to proceed.    Procedure Detail: Patient had received broad-spectrum preoperative antibiotics.  She was brought to the operating room, placed in supine position on the operating table, and general anesthesia induced.  Her abdomen was widely sterilely prepped and draped.  Foley catheter was placed.  Patient timeout was performed and correct procedure verified.  Trocar sites were infiltrated with local anesthetic.  Access was obtained with a 1/2 cm incision at the umbilicus with an open Hassan technique through mattress suture of 0 Vicryl and pneumoperitoneum established.  5 mm trochars were placed under direct vision in the epigastrium and left lower quadrant.  The appendix was exposed lying lateral to the cecum and terminal ileum.  It was freed from inflammatory adhesions and elevated.  There was purulent inflammation but no evidence of gangrene or perforation.  Lateral peritoneal attachments were divided mobilizing the cecum and appendix.  The mesoappendix was then sequentially divided with Harmonic Scalpel until it was completely freed down to the tip of the cecum.  The appendix was then divided across the tip of the cecum with a single firing of the Endo GIA 45 mm stapler.  The  appendix was placed in an eco-sac and removed through the umbilical incision.  The right lower quadrant was thoroughly irrigated.  Staple line was intact and there was no evidence of bleeding or injury or other problems.  All CO2 was evacuated and the mattress suture secured to the umbilicus.  Skin incisions were closed with subcuticular Monocryl and Dermabond.  Sponge needle and instrument counts were correct.    Findings: Acute appendicitis without perforation or gangrene  Estimated Blood Loss:  Minimal         Drains: None  Blood Given: none          Specimens: Appendix        Complications:  * No complications entered in OR log *         Disposition: PACU - hemodynamically stable.         Condition: stable

## 2018-05-10 NOTE — Progress Notes (Signed)
Pharmacy Antibiotic Note  Lisa Gallegos is a 62 y.o. female admitted on 05/09/2018 with intra abdominal infection.  Pharmacy has been consulted for zosyn dosing.  Plan: Zosyn 3.375g IV q8h (4 hour infusion).  Height: 5\' 5"  (165.1 cm) Weight: 170 lb (77.1 kg) IBW/kg (Calculated) : 57  Temp (24hrs), Avg:99.3 F (37.4 C), Min:99.3 F (37.4 C), Max:99.3 F (37.4 C)  Recent Labs  Lab 05/09/18 2219  WBC 14.6*  CREATININE 0.96    Estimated Creatinine Clearance: 63.9 mL/min (by C-G formula based on SCr of 0.96 mg/dL).    No Known Allergies  Thank you for allowing pharmacy to be a part of this patient's care.  Excell Seltzer [place] 05/10/2018 2:36 AM

## 2018-05-10 NOTE — Anesthesia Preprocedure Evaluation (Addendum)
Anesthesia Evaluation  Patient identified by MRN, date of birth, ID band Patient awake    Reviewed: Allergy & Precautions, NPO status , Patient's Chart, lab work & pertinent test results  History of Anesthesia Complications Negative for: history of anesthetic complications  Airway Mallampati: II  TM Distance: >3 FB Neck ROM: Full    Dental no notable dental hx.    Pulmonary neg pulmonary ROS,    Pulmonary exam normal breath sounds clear to auscultation       Cardiovascular negative cardio ROS Normal cardiovascular exam Rhythm:Regular Rate:Normal     Neuro/Psych Anxiety negative neurological ROS  negative psych ROS   GI/Hepatic negative GI ROS, Neg liver ROS,  Appendicitis    Endo/Other  negative endocrine ROS  Renal/GU negative Renal ROS  negative genitourinary   Musculoskeletal negative musculoskeletal ROS (+)   Abdominal   Peds negative pediatric ROS (+)  Hematology negative hematology ROS (+)   Anesthesia Other Findings   Reproductive/Obstetrics negative OB ROS                            Anesthesia Physical Anesthesia Plan  ASA: I  Anesthesia Plan: General   Post-op Pain Management:    Induction: Intravenous  PONV Risk Score and Plan: 4 or greater and Treatment may vary due to age or medical condition, Ondansetron, Scopolamine patch - Pre-op, Dexamethasone and Midazolam  Airway Management Planned: Oral ETT  Additional Equipment: None  Intra-op Plan:   Post-operative Plan: Extubation in OR  Informed Consent: I have reviewed the patients History and Physical, chart, labs and discussed the procedure including the risks, benefits and alternatives for the proposed anesthesia with the patient or authorized representative who has indicated his/her understanding and acceptance.   Dental advisory given  Plan Discussed with: CRNA and Anesthesiologist  Anesthesia Plan  Comments:         Anesthesia Quick Evaluation

## 2018-05-10 NOTE — ED Notes (Signed)
Patient transported to CT 

## 2018-05-10 NOTE — Discharge Instructions (Signed)
Please arrive at least 30 min before your appointment to complete your check in paperwork.  If you are unable to arrive 30 min prior to your appointment time we may have to cancel or reschedule you. ° °LAPAROSCOPIC SURGERY: POST OP INSTRUCTIONS  °1. DIET: Follow a light bland diet the first 24 hours after arrival home, such as soup, liquids, crackers, etc. Be sure to include lots of fluids daily. Avoid fast food or heavy meals as your are more likely to get nauseated. Eat a low fat the next few days after surgery.  °2. Take your usually prescribed home medications unless otherwise directed. °3. PAIN CONTROL:  °1. Pain is best controlled by a usual combination of three different methods TOGETHER:  °1. Ice/Heat °2. Over the counter pain medication °3. Prescription pain medication °2. Most patients will experience some swelling and bruising around the incisions. Ice packs or heating pads (30-60 minutes up to 6 times a day) will help. Use ice for the first few days to help decrease swelling and bruising, then switch to heat to help relax tight/sore spots and speed recovery. Some people prefer to use ice alone, heat alone, alternating between ice & heat. Experiment to what works for you. Swelling and bruising can take several weeks to resolve.  °3. It is helpful to take an over-the-counter pain medication regularly for the first few weeks. Choose one of the following that works best for you:  °1. Naproxen (Aleve, etc) Two 220mg tabs twice a day °2. Ibuprofen (Advil, etc) Three 200mg tabs four times a day (every meal & bedtime) °3. Acetaminophen (Tylenol, etc) 500-650mg four times a day (every meal & bedtime) °4. A prescription for pain medication (such as oxycodone, hydrocodone, etc) should be given to you upon discharge. Take your pain medication as prescribed.  °1. If you are having problems/concerns with the prescription medicine (does not control pain, nausea, vomiting, rash, itching, etc), please call us [phone] to see if we need to switch you to a different pain medicine that will work better for you and/or control your side effect better. °2. If you need a refill on your pain medication, please contact your pharmacy. They will contact our office to request authorization. Prescriptions will not be filled after 5 pm or on week-ends. °4. Avoid getting constipated. Between the surgery and the pain medications, it is common to experience some constipation. Increasing fluid intake and taking a fiber supplement (such as Metamucil, Citrucel, FiberCon, MiraLax, etc) 1-2 times a day regularly will usually help prevent this problem from occurring. A mild laxative (prune juice, Milk of Magnesia, MiraLax, etc) should be taken according to package directions if there are no bowel movements after 48 hours.  °5. Watch out for diarrhea. If you have many loose bowel movements, simplify your diet to bland foods & liquids for a few days. Stop any stool softeners and decrease your fiber supplement. Switching to mild anti-diarrheal medications (Kayopectate, Pepto Bismol) can help. If this worsens or does not improve, please call us. °6. Wash / shower every day. You may shower over the dressings as they are waterproof. Continue to shower over incision(s) after the dressing is off. °7. Remove your waterproof bandages 5 days after surgery. You may leave the incision open to air. You may replace a dressing/Band-Aid to cover the incision for comfort if you wish.  °8. ACTIVITIES as tolerated:  °1. You may resume regular (light) daily activities beginning the next day--such as daily self-care, walking, climbing stairs--gradually   increasing activities as tolerated. If you can walk 30 minutes without difficulty, it is safe to try more intense activity such as jogging, treadmill, bicycling, low-impact aerobics, swimming, etc. °2. Save the most intensive and strenuous activity for last such as sit-ups, heavy lifting, contact sports, etc Refrain  from any heavy lifting or straining until you are off narcotics for pain control.  °3. DO NOT PUSH THROUGH PAIN. Let pain be your guide: If it hurts to do something, don't do it. Pain is your body warning you to avoid that activity for another week until the pain goes down. °4. You may drive when you are no longer taking prescription pain medication, you can comfortably wear a seatbelt, and you can safely maneuver your car and apply brakes. °5. You may have sexual intercourse when it is comfortable.  °9. FOLLOW UP in our office  °1. Please call [facility] at [phone] to set up an appointment to see your surgeon in the office for a follow-up appointment approximately 2-3 weeks after your surgery. °2. Make sure that you call for this appointment the day you arrive home to insure a convenient appointment time. °     10. IF YOU HAVE DISABILITY OR FAMILY LEAVE FORMS, BRING THEM TO THE               OFFICE FOR PROCESSING.  ° °WHEN TO CALL US [phone]:  °1. Poor pain control °2. Reactions / problems with new medications (rash/itching, nausea, etc)  °3. Fever over 101.5 F (38.5 C) °4. Inability to urinate °5. Nausea and/or vomiting °6. Worsening swelling or bruising °7. Continued bleeding from incision. °8. Increased pain, redness, or drainage from the incision ° °The clinic staff is available to answer your questions during regular business hours (8:30am-5pm). Please don’t hesitate to call and ask to speak to one of our nurses for clinical concerns.  °If you have a medical emergency, go to the nearest emergency room or call 911.  °A surgeon from [facility] is always on call at the hospitals  ° °[facility]  °[address], [address], [place], [place] [address] ?  °MAIN: [phone] ? TOLL FREE: [phone] ?  °FAX [phone]  °[email or web address] ° °

## 2018-05-10 NOTE — Transfer of Care (Signed)
Immediate Anesthesia Transfer of Care Note  Patient: Lisa Gallegos  Procedure(s) Performed: APPENDECTOMY LAPAROSCOPIC (N/A )  Patient Location: PACU  Anesthesia Type:General  Level of Consciousness: awake, alert  and oriented  Airway & Oxygen Therapy: Patient Spontanous Breathing and Patient connected to nasal cannula oxygen  Post-op Assessment: Report given to RN and Post -op Vital signs reviewed and stable  Post vital signs: Reviewed and stable  Last Vitals:  Vitals Value Taken Time  BP 105/57 05/10/2018 11:49 AM  Temp 36.7 C 05/10/2018 11:48 AM  Pulse 67 05/10/2018 11:59 AM  Resp 15 05/10/2018 11:59 AM  SpO2 98 % 05/10/2018 11:59 AM  Vitals shown include unvalidated device data.  Last Pain:  Vitals:   05/10/18 1148  TempSrc:   PainSc: 0-No pain         Complications: No apparent anesthesia complications

## 2018-05-11 ENCOUNTER — Encounter (HOSPITAL_COMMUNITY): Payer: Self-pay | Admitting: General Surgery

## 2018-08-15 ENCOUNTER — Other Ambulatory Visit: Payer: Self-pay | Admitting: Obstetrics & Gynecology

## 2018-08-15 DIAGNOSIS — Z1231 Encounter for screening mammogram for malignant neoplasm of breast: Secondary | ICD-10-CM

## 2018-09-30 ENCOUNTER — Ambulatory Visit
Admission: RE | Admit: 2018-09-30 | Discharge: 2018-09-30 | Disposition: A | Discharge status: Home / Self Care | Payer: BLUE CROSS/BLUE SHIELD | Source: Ambulatory Visit | Attending: Obstetrics & Gynecology | Admitting: Obstetrics & Gynecology

## 2018-09-30 DIAGNOSIS — Z1231 Encounter for screening mammogram for malignant neoplasm of breast: Secondary | ICD-10-CM | POA: Diagnosis not present

## 2018-10-04 DIAGNOSIS — D219 Benign neoplasm of connective and other soft tissue, unspecified: Secondary | ICD-10-CM | POA: Diagnosis not present

## 2018-11-06 ENCOUNTER — Encounter: Payer: Self-pay | Admitting: Family

## 2018-11-29 ENCOUNTER — Emergency Department (HOSPITAL_COMMUNITY): Payer: BLUE CROSS/BLUE SHIELD

## 2018-11-29 ENCOUNTER — Emergency Department (HOSPITAL_COMMUNITY)
Admission: EM | Admit: 2018-11-29 | Discharge: 2018-11-29 | Disposition: A | Discharge status: Home / Self Care | Payer: BLUE CROSS/BLUE SHIELD | Attending: Emergency Medicine | Admitting: Emergency Medicine

## 2018-11-29 ENCOUNTER — Other Ambulatory Visit: Payer: Self-pay

## 2018-11-29 DIAGNOSIS — R52 Pain, unspecified: Secondary | ICD-10-CM | POA: Diagnosis not present

## 2018-11-29 DIAGNOSIS — S80919A Unspecified superficial injury of unspecified knee, initial encounter: Secondary | ICD-10-CM | POA: Diagnosis not present

## 2018-11-29 DIAGNOSIS — M25561 Pain in right knee: Secondary | ICD-10-CM | POA: Diagnosis not present

## 2018-11-29 DIAGNOSIS — S0990XA Unspecified injury of head, initial encounter: Secondary | ICD-10-CM | POA: Diagnosis not present

## 2018-11-29 DIAGNOSIS — S0993XA Unspecified injury of face, initial encounter: Secondary | ICD-10-CM | POA: Diagnosis not present

## 2018-11-29 DIAGNOSIS — Y939 Activity, unspecified: Secondary | ICD-10-CM | POA: Insufficient documentation

## 2018-11-29 DIAGNOSIS — Y929 Unspecified place or not applicable: Secondary | ICD-10-CM | POA: Diagnosis not present

## 2018-11-29 DIAGNOSIS — T07XXXA Unspecified multiple injuries, initial encounter: Secondary | ICD-10-CM

## 2018-11-29 DIAGNOSIS — Y999 Unspecified external cause status: Secondary | ICD-10-CM | POA: Diagnosis not present

## 2018-11-29 DIAGNOSIS — W1830XA Fall on same level, unspecified, initial encounter: Secondary | ICD-10-CM | POA: Diagnosis not present

## 2018-11-29 DIAGNOSIS — S0083XA Contusion of other part of head, initial encounter: Secondary | ICD-10-CM | POA: Diagnosis not present

## 2018-11-29 DIAGNOSIS — S0081XA Abrasion of other part of head, initial encounter: Secondary | ICD-10-CM | POA: Diagnosis not present

## 2018-11-29 DIAGNOSIS — W19XXXA Unspecified fall, initial encounter: Secondary | ICD-10-CM

## 2018-11-29 DIAGNOSIS — I10 Essential (primary) hypertension: Secondary | ICD-10-CM | POA: Diagnosis not present

## 2018-11-29 DIAGNOSIS — S199XXA Unspecified injury of neck, initial encounter: Secondary | ICD-10-CM | POA: Diagnosis not present

## 2018-11-29 NOTE — ED Provider Notes (Signed)
[facility] Provider Note   CSN: [id number] Arrival date & time: 11/29/18  8938     History   Chief Complaint Chief Complaint  Patient presents with  . Fall    HPI Lisa Gallegos is a 63 y.o. female.  HPI    Patient is here for evaluation of injuries from fall. She was getting out of a car, and thinks she tripped on her purse strap, fell, injuring her face.  She was able to get up and walk afterwards.  She denies loss of consciousness, blurred vision, pain in neck, back or extremities.  Last tetanus booster was 5 years ago.  No recent illnesses.  Other prodrome.  There are no other known modifying factors.    No past medical history on file.  Patient Active Problem List   Diagnosis Date Noted  . Appendicitis 05/10/2018  . Situational anxiety 04/05/2018  . Vitamin D deficiency 04/05/2018  . Routine physical examination 12/27/2016  . Menopausal symptoms 03/24/2015  . Hyperlipidemia 03/24/2015  . Seborrheic keratoses 03/24/2015  . Weight gain 09/15/2014  . Elevated BP 09/15/2014    Past Surgical History:  Procedure Laterality Date  . CESAREAN SECTION  1985   FTP  . KIDNEY STONE SURGERY     laser surgery  . LAPAROSCOPIC APPENDECTOMY N/A 05/10/2018   Procedure: APPENDECTOMY LAPAROSCOPIC;  Surgeon: Excell Seltzer, MD;  Location: [facility];  Service: General;  Laterality: N/A;     OB History   No obstetric history on file.      Home Medications    Prior to Admission medications   Medication Sig Start Date End Date Taking? Authorizing Provider  ibuprofen (MOTRIN IB) 200 MG tablet Take 2-4 tablets (400-800 mg total) by mouth every 8 (eight) hours as needed. Patient taking differently: Take 400 mg by mouth every 8 (eight) hours as needed for headache.  05/10/18 05/10/19 Yes Saverio Danker, PA-C  acetaminophen (TYLENOL) 325 MG tablet Take 3 tablets (975 mg total) by mouth every 6 (six) hours as needed. Patient not taking: Reported  on 11/29/2018 05/10/18   Saverio Danker, PA-C  oxyCODONE (ROXICODONE) 5 MG immediate release tablet Take 1 tablet (5 mg total) by mouth every 6 (six) hours as needed for severe pain. Patient not taking: Reported on 11/29/2018 05/10/18   Saverio Danker, PA-C    Family History Family History  Problem Relation Age of Onset  . Cancer Mother        Pancreatic cancer   . Heart disease Father   . Breast cancer Neg Hx     Social History Social History   Tobacco Use  . Smoking status: Never Smoker  . Smokeless tobacco: Never Used  Substance Use Topics  . Alcohol use: Yes    Alcohol/week: 0.0 standard drinks    Comment: couple drinks a week   . Drug use: No     Allergies   Patient has no known allergies.   Review of Systems Review of Systems  All other systems reviewed and are negative.    Physical Exam Updated Vital Signs BP 136/63 (BP Location: Right Arm)   Pulse 74   Temp 98.9 F (37.2 C) (Oral)   Resp 16   Ht 5\' 5"  (1.651 m)   Wt 79.4 kg   LMP 02/17/2012   SpO2 97%   BMI 29.12 kg/m   Physical Exam Vitals signs and nursing note reviewed.  Constitutional:      General: She is in acute distress (Uncomfortable).  Appearance: Normal appearance. She is well-developed. She is not ill-appearing, toxic-appearing or diaphoretic.  HENT:     Head: Normocephalic.     Comments: Large contusion with abrasion left forehead.  No associated crepitation or deformity.  Mild tenderness left cheek.  No midface crepitation or deformity.  No trismus.  No nose deformity.  Abrasions on nose, not bleeding.  No blood per nares.    Right Ear: External ear normal.     Left Ear: External ear normal.     Mouth/Throat:     Mouth: Mucous membranes are moist.     Pharynx: No oropharyngeal exudate or posterior oropharyngeal erythema.  Eyes:     Conjunctiva/sclera: Conjunctivae normal.     Pupils: Pupils are equal, round, and reactive to light.  Neck:     Musculoskeletal: Normal range of  motion and neck supple.     Trachea: Phonation normal.  Cardiovascular:     Rate and Rhythm: Normal rate and regular rhythm.     Heart sounds: Normal heart sounds.  Pulmonary:     Effort: Pulmonary effort is normal.     Breath sounds: Normal breath sounds.  Abdominal:     Palpations: Abdomen is soft.     Tenderness: There is no abdominal tenderness.  Musculoskeletal: Normal range of motion.        General: No swelling or tenderness.  Skin:    General: Skin is warm and dry.  Neurological:     Mental Status: She is alert and oriented to person, place, and time.     Cranial Nerves: No cranial nerve deficit.     Sensory: No sensory deficit.     Motor: No abnormal muscle tone.     Coordination: Coordination normal.     Comments: No dysarthria, aphasia or nystagmus.  Psychiatric:        Mood and Affect: Mood normal.        Behavior: Behavior normal.        Thought Content: Thought content normal.        Judgment: Judgment normal.      ED Treatments / Results  Labs (all labs ordered are listed, but only abnormal results are displayed) Labs Reviewed - No data to display  EKG None  Radiology Ct Head Wo Contrast  Result Date: 11/29/2018 CLINICAL DATA:  Fall EXAM: CT HEAD WITHOUT CONTRAST CT FACIAL BONES WITHOUT CONTRAST CT CERVICAL SPINE WITHOUT CONTRAST TECHNIQUE: Multidetector CT imaging of the head, facial bones, and spine was performed following the standard protocol without intravenous contrast. Multiplanar CT image reconstructions of the cervical spine were also generated. COMPARISON:  None. FINDINGS: CT HEAD FINDINGS Brain: No evidence of acute infarction, hemorrhage, hydrocephalus, extra-axial collection or mass lesion/mass effect. Vascular: No hyperdense vessel or unexpected calcification. CT FACIAL BONES FINDINGS Skull: Normal. Negative for fracture or focal lesion. Facial bones: No displaced fractures or dislocations. Sinuses/Orbits: No acute finding. Other: Soft tissue  hematoma of the left forehead. CT CERVICAL SPINE FINDINGS Alignment: Normal. Skull base and vertebrae: No acute fracture. No primary bone lesion or focal pathologic process. Soft tissues and spinal canal: No prevertebral fluid or swelling. No visible canal hematoma. Disc levels: Generally mild multilevel disc space height loss and osteophytosis. Upper chest: Negative. Other: None. IMPRESSION: 1.  No acute intracranial pathology. 2.  No displaced fracture or dislocation of the facial bones. 3.  Soft tissue hematoma of the left forehead. 4.  No fracture or static subluxation of the cervical spine. Electronically Signed   By:  Eddie Candle M.D.   On: 11/29/2018 11:14   Ct Cervical Spine Wo Contrast  Result Date: 11/29/2018 CLINICAL DATA:  Fall EXAM: CT HEAD WITHOUT CONTRAST CT FACIAL BONES WITHOUT CONTRAST CT CERVICAL SPINE WITHOUT CONTRAST TECHNIQUE: Multidetector CT imaging of the head, facial bones, and spine was performed following the standard protocol without intravenous contrast. Multiplanar CT image reconstructions of the cervical spine were also generated. COMPARISON:  None. FINDINGS: CT HEAD FINDINGS Brain: No evidence of acute infarction, hemorrhage, hydrocephalus, extra-axial collection or mass lesion/mass effect. Vascular: No hyperdense vessel or unexpected calcification. CT FACIAL BONES FINDINGS Skull: Normal. Negative for fracture or focal lesion. Facial bones: No displaced fractures or dislocations. Sinuses/Orbits: No acute finding. Other: Soft tissue hematoma of the left forehead. CT CERVICAL SPINE FINDINGS Alignment: Normal. Skull base and vertebrae: No acute fracture. No primary bone lesion or focal pathologic process. Soft tissues and spinal canal: No prevertebral fluid or swelling. No visible canal hematoma. Disc levels: Generally mild multilevel disc space height loss and osteophytosis. Upper chest: Negative. Other: None. IMPRESSION: 1.  No acute intracranial pathology. 2.  No displaced  fracture or dislocation of the facial bones. 3.  Soft tissue hematoma of the left forehead. 4.  No fracture or static subluxation of the cervical spine. Electronically Signed   By: Eddie Candle M.D.   On: 11/29/2018 11:14   Ct Maxillofacial Wo Cm  Result Date: 11/29/2018 CLINICAL DATA:  Fall EXAM: CT HEAD WITHOUT CONTRAST CT FACIAL BONES WITHOUT CONTRAST CT CERVICAL SPINE WITHOUT CONTRAST TECHNIQUE: Multidetector CT imaging of the head, facial bones, and spine was performed following the standard protocol without intravenous contrast. Multiplanar CT image reconstructions of the cervical spine were also generated. COMPARISON:  None. FINDINGS: CT HEAD FINDINGS Brain: No evidence of acute infarction, hemorrhage, hydrocephalus, extra-axial collection or mass lesion/mass effect. Vascular: No hyperdense vessel or unexpected calcification. CT FACIAL BONES FINDINGS Skull: Normal. Negative for fracture or focal lesion. Facial bones: No displaced fractures or dislocations. Sinuses/Orbits: No acute finding. Other: Soft tissue hematoma of the left forehead. CT CERVICAL SPINE FINDINGS Alignment: Normal. Skull base and vertebrae: No acute fracture. No primary bone lesion or focal pathologic process. Soft tissues and spinal canal: No prevertebral fluid or swelling. No visible canal hematoma. Disc levels: Generally mild multilevel disc space height loss and osteophytosis. Upper chest: Negative. Other: None. IMPRESSION: 1.  No acute intracranial pathology. 2.  No displaced fracture or dislocation of the facial bones. 3.  Soft tissue hematoma of the left forehead. 4.  No fracture or static subluxation of the cervical spine. Electronically Signed   By: Eddie Candle M.D.   On: 11/29/2018 11:14    Procedures Procedures (including critical care time)  Medications Ordered in ED Medications - No data to display   Initial Impression / Assessment and Plan / ED Course  I have reviewed the triage vital signs and the nursing  notes.  Pertinent labs & imaging results that were available during my care of the patient were reviewed by me and considered in my medical decision making (see chart for details).      Patient Vitals for the past 24 hrs:  BP Temp Temp src Pulse Resp SpO2 Height Weight  11/29/18 1223 136/63 - - 74 16 97 % - -  11/29/18 1000 139/66 - - 75 - 98 % - -  11/29/18 0955 134/77 98.9 F (37.2 C) Oral 80 16 99 % - -  11/29/18 3235 - - - - - - 5'  5" (1.651 m) 79.4 kg    12:40 PM Reevaluation with update and discussion. After initial assessment and treatment, an updated evaluation reveals she remains comfortable.  After cleansing there is no dehiscence or opening of the wound of the face.  Findings discussed and questions answered. Daleen Bo   Medical Decision Making: Fall likely mechanical without serious injury.  Doubt fracture, intracranial injury or cervical spine injury.  CRITICAL CARE-no Performed by: Daleen Bo  Nursing Notes Reviewed/ Care Coordinated Applicable Imaging Reviewed Interpretation of Laboratory Data incorporated into ED treatment  The patient appears reasonably screened and/or stabilized for discharge and I doubt any other medical condition or other [facility] requiring further screening, evaluation, or treatment in the ED at this time prior to discharge.  Plan: Home Medications-continue usual medicines use OTC analgesia; Home Treatments-rest, wound care; return here if the recommended treatment, does not improve the symptoms; Recommended follow up-PCP, PRN   Final Clinical Impressions(s) / ED Diagnoses   Final diagnoses:  Fall, initial encounter  Injury of head, initial encounter  Contusion, multiple sites  Abrasion, multiple sites    ED Discharge Orders    None       Daleen Bo, MD 11/29/18 1241

## 2018-11-29 NOTE — Discharge Instructions (Addendum)
Keep the wounds clean with soap and water at least twice a day.  Use ice on the sore area 3 times a day for 2 to 3 days.  Take Tylenol or ibuprofen for pain.  Return here or see your doctor for problems.

## 2018-11-29 NOTE — ED Triage Notes (Signed)
Pt BIB [facility]. Pt was dropping her grandchild off at daycare when she tripped on the strap of her bag and fell, hitting her head on the asphalt. Pt denies any loss of consciousness. Pt takes no mediations at home. EMS reports patient is complaining of 6/10 head pain. Pt did report some left knee pain where she hit her knee during her fall but denies any pain upon arrival to the ED. Pt is alert and oriented x4 with VSS. Hematoma and abrasion noted on the patient's forehead.

## 2018-11-29 NOTE — ED Notes (Signed)
Pt given discharge instructions and follow up information. Pt given the opportunity to ask questions. Pt verbalized understanding. Pt discharged from the ED.  

## 2018-11-29 NOTE — ED Notes (Signed)
Patient transported to CT 

## 2018-12-09 ENCOUNTER — Ambulatory Visit: Payer: BLUE CROSS/BLUE SHIELD | Admitting: Family Medicine

## 2018-12-09 ENCOUNTER — Encounter: Payer: Self-pay | Admitting: Family Medicine

## 2018-12-09 VITALS — BP 142/84 | HR 75 | Temp 98.8°F | Resp 16 | Ht 65.0 in | Wt 186.6 lb

## 2018-12-09 DIAGNOSIS — S0990XS Unspecified injury of head, sequela: Secondary | ICD-10-CM

## 2018-12-09 DIAGNOSIS — M7981 Nontraumatic hematoma of soft tissue: Secondary | ICD-10-CM

## 2018-12-09 DIAGNOSIS — W19XXXS Unspecified fall, sequela: Secondary | ICD-10-CM | POA: Diagnosis not present

## 2018-12-09 NOTE — Patient Instructions (Signed)
Hematoma A hematoma is a collection of blood. A hematoma can happen:  Under the skin. The blood can thicken (clot) to form a lump that you can see and feel. The lump is often hard and may become sore and tender. The lump can be very small or very big. Most hematomas get better in a few days to weeks. However, some hematomas may be serious and need medical care. What are the causes? This condition is caused by:  An injury.  Blood that leaks under the skin.  Medical conditions that cause bleeding or bruising. What are the signs or symptoms? Symptoms depend on where the hematoma is in your body.  If the hematoma is under the skin, there is: ? A firm lump on the body. ? Pain and tenderness in the area. ? Bruising. The skin above the lump may be blue, dark blue, purple-red, or yellowish. How is this diagnosed? This condition is diagnosed based on:  Your medical history.  A physical exam.  Imaging tests, such as ultrasound or CT scan.  Blood tests. How is this treated? Treatment depends on the cause, size, and location of the hematoma. Treatment may include:  Doing nothing. Many hematomas go away on their own without treatment.  Surgery or close monitoring. This may be needed for large hematomas or hematomas that affect the body's organs.  Medicines. These may be given if a medical condition caused the hematoma. Follow these instructions at home: Managing pain, stiffness, and swelling  If told, put heat on the affected area after putting ice on the area for two days. Use the heat source that your doctor tells you to use. This could be a moist heat pack or a heating pad. To do this: ? Place a towel between your skin and the heat source. ? Leave the heat on for 20-30 minutes. ? Remove the heat if your skin turns bright red. This is very important if you are unable to feel pain, heat, or cold. You may have a greater risk of getting burned. General instructions  Take  over-the-counter and prescription medicines only as told by your doctor.  Keep all follow-up visits as told by your doctor. This is important. Contact a doctor if:  You have a fever.  The swelling or bruising gets worse.  You start to get more hematomas. Get help right away if:  Your pain gets worse.  Your pain is not getting better with medicine.  Your skin over the hematoma breaks or starts to bleed.  Your hematoma is in your chest or belly and you: ? Pass out. ? Feel weak. ? Become short of breath.  You have a hematoma on your scalp that is caused by a fall or injury, and you: ? Have a headache that gets worse. ? Have trouble speaking or understanding speech. ? Become less alert or you pass out. Summary  A hematoma is a collection of blood in any part of your body.  Most hematomas get better on their own in a few days to weeks. Some may need medical care.  Follow instructions from your doctor about how to care for your hematoma.  Contact a doctor if the swelling or bruising gets worse, or if you are short of breath. This information is not intended to replace advice given to you by your health care provider. Make sure you discuss any questions you have with your health care provider. Document Released: 11/09/2004 Document Revised: 03/07/2018 Document Reviewed: 03/07/2018 Elsevier Interactive Patient Education  2019 [place].

## 2018-12-09 NOTE — Progress Notes (Signed)
Subjective:    Patient ID: Lisa Gallegos, female    DOB: 08/17/1956, 63 y.o.   MRN: [id number]  HPI  Presents to clinic for ER follow up. Went to ER on 11/29/2018 for evaluation of injuries from fall. She was getting out of a car, and thinks she tripped on her purse strap, fell, injuring her face.  She was able to get up and walk afterwards.  CT of head, facial bones and cervical spine done in ER and reviewed IMPRESSION: 1.  No acute intracranial pathology.  2.  No displaced fracture or dislocation of the facial bones.  3.  Soft tissue hematoma of the left forehead.  4.  No fracture or static subluxation of the cervical spine.   Patient is concerned because the soft tissue hematoma on forehead seems to be taking a while to resolve. States the bruising around eyes has started to yellow in color. The hematoma on forehead is looking dark purple with some yellowing of edges.   Denies any headaches, nausea, dizziness.  Denies changes in vision, speech difficulty, one-sided weakness.  Patient Active Problem List   Diagnosis Date Noted  . Appendicitis 05/10/2018  . Situational anxiety 04/05/2018  . Vitamin D deficiency 04/05/2018  . Routine physical examination 12/27/2016  . Menopausal symptoms 03/24/2015  . Hyperlipidemia 03/24/2015  . Seborrheic keratoses 03/24/2015  . Weight gain 09/15/2014  . Elevated BP 09/15/2014   Social History   Tobacco Use  . Smoking status: Never Smoker  . Smokeless tobacco: Never Used  Substance Use Topics  . Alcohol use: Yes    Alcohol/week: 0.0 standard drinks    Comment: couple drinks a week     Review of Systems  Constitutional: Negative for chills, fatigue and fever.  HENT: Negative for congestion, ear pain, sinus pain and sore throat.   Eyes: Negative.   Respiratory: Negative for cough, shortness of breath and wheezing.   Cardiovascular: Negative for chest pain, palpitations and leg swelling.  Gastrointestinal: Negative for abdominal  pain, diarrhea, nausea and vomiting.  Genitourinary: Negative for dysuria, frequency and urgency.  Musculoskeletal: Negative for arthralgias and myalgias.  Skin: Negative for color change, pallor and rash.  +soft tissue hematoma on forehead and bruising around eyes Neurological: Negative for syncope, light-headedness and headaches.  Psychiatric/Behavioral: The patient is not nervous/anxious.       Objective:   Physical Exam Vitals signs and nursing note reviewed.  Constitutional:      General: She is not in acute distress.    Appearance: She is not toxic-appearing.  HENT:     Head: Normocephalic and atraumatic.     Right Ear: Tympanic membrane, ear canal and external ear normal.     Left Ear: Tympanic membrane, ear canal and external ear normal.     Mouth/Throat:     Mouth: Mucous membranes are moist.  Eyes:     General: No scleral icterus.    Extraocular Movements: Extraocular movements intact.     Conjunctiva/sclera: Conjunctivae normal.  Cardiovascular:     Rate and Rhythm: Normal rate and regular rhythm.  Pulmonary:     Effort: Pulmonary effort is normal. No respiratory distress.     Breath sounds: Normal breath sounds.  Skin:    General: Skin is warm and dry.     Coloration: Skin is not pale.     Findings: Bruising (around eyes, healing/yellowing in color. Soft tissue hematoma on forehead above left eyebrow, about size of gumball - dark purpling in color with surrounding  yellowing on edges of hematoma. ) present.  Neurological:     General: No focal deficit present.     Mental Status: She is alert and oriented to person, place, and time.     Cranial Nerves: No cranial nerve deficit.     Motor: No weakness.     Coordination: Coordination normal.     Gait: Gait normal.  Psychiatric:        Mood and Affect: Mood normal.        Behavior: Behavior normal.    Vitals:   12/09/18 1615  BP: (!) 142/84  Pulse: 75  Resp: 16  Temp: 98.8 F (37.1 C)  SpO2: 96%        Assessment & Plan:   Soft tissue hematoma, fall, head injury - reassured patient that her bruises appear to be healing on schedule.  Advised that hematomas can often take longer to resolve depending on size.  Advised patient to put a warm pack and apply some pressure on forehead hematoma to help improve resolution timeframe.  Advised to monitor cell for any changes in symptoms including development of headache, vision changes, speech difficulty, dizziness, nausea or vomiting and to let us know if any of these things occur.  Patient will keep otherwise regularly scheduled follow-up with PCP as planned.  Advised to return to clinic sooner if any issues arise.

## 2018-12-29 ENCOUNTER — Encounter: Payer: Self-pay | Admitting: Family

## 2018-12-30 ENCOUNTER — Other Ambulatory Visit: Payer: Self-pay | Admitting: Family

## 2018-12-30 DIAGNOSIS — R21 Rash and other nonspecific skin eruption: Secondary | ICD-10-CM

## 2018-12-30 MED ORDER — MUPIROCIN 2 % EX OINT: 1.0000 "application " | TOPICAL_OINTMENT | Freq: Two times a day (BID) | CUTANEOUS | 1 refills | Status: DC

## 2019-03-31 ENCOUNTER — Encounter: Payer: Self-pay | Admitting: Family

## 2019-03-31 ENCOUNTER — Telehealth: Payer: Self-pay | Admitting: Family

## 2019-03-31 NOTE — Telephone Encounter (Signed)
Copied from [place] [id number]. Topic: Quick Communication - Rx Refill/Question >> Mar 31, 2019  1:25 PM Scherrie Gerlach wrote: Medication: doxycycline (VIBRA-TABS) 100 MG tablet   Pt got a tick off her on Sat , requesting abx called in for preventative purposes. Pt is not having an issues with the tick site, just wants.  Last time dr sent  abx for tick bite was 2017.  [id number] [address], [place] [phone] (Phone) [phone] (Fax)

## 2019-04-01 NOTE — Telephone Encounter (Signed)
Patient called to say that she would like a response in regards to the tick bit that she called about on 03/31/2019. Per patient she is worried and need to know what is the next step.   Ph# [phone]

## 2019-04-02 ENCOUNTER — Ambulatory Visit (INDEPENDENT_AMBULATORY_CARE_PROVIDER_SITE_OTHER): Payer: BC Managed Care – PPO | Admitting: Family Medicine

## 2019-04-02 ENCOUNTER — Other Ambulatory Visit: Payer: Self-pay

## 2019-04-02 DIAGNOSIS — W57XXXA Bitten or stung by nonvenomous insect and other nonvenomous arthropods, initial encounter: Secondary | ICD-10-CM | POA: Diagnosis not present

## 2019-04-02 DIAGNOSIS — S30860A Insect bite (nonvenomous) of lower back and pelvis, initial encounter: Secondary | ICD-10-CM | POA: Diagnosis not present

## 2019-04-02 MED ORDER — DOXYCYCLINE HYCLATE 100 MG PO TABS: 100.0000 mg | ORAL_TABLET | Freq: Two times a day (BID) | ORAL | 0 refills | Status: AC

## 2019-04-02 NOTE — Telephone Encounter (Signed)
Pt had Doxy visit with NP Lisa Gallegos today

## 2019-04-02 NOTE — Progress Notes (Signed)
Patient ID: Lisa Gallegos, female   DOB: 26-Jun-1956, 63 y.o.   MRN: [id number]    Virtual Visit via video Note  This visit type was conducted due to national recommendations for restrictions regarding the COVID-19 pandemic (e.g. social distancing).  This format is felt to be most appropriate for this patient at this time.  All issues noted in this document were discussed and addressed.  No physical exam was performed (except for noted visual exam findings with Video Visits).   I connected with Lisa Gallegos today at  8:40 AM EDT by a video enabled telemedicine application and verified that I am speaking with the correct person using two identifiers. Location patient: home Location provider: work or home office Persons participating in the virtual visit: patient, provider  I discussed the limitations, risks, security and privacy concerns of performing an evaluation and management service by video and the availability of in person appointments. I also discussed with the patient that there may be a patient responsible charge related to this service. The patient expressed understanding and agreed to proceed.   HPI:  Patient and I connected via video today to discuss tick bite.  Patient was at her daughter's over the weekend and they were outdoors.  States her daughter's property is very worried.  Approximately 3 days later, she felt an itching sensation on her back and reached around and scratched area, unable to see was on her back so had family member look and was told it was a tick.  They were able to successfully remove the tick.  Patient unsure of exactly how long the tick was on her, but guesstimates it was on her anywhere from 2 to 3 days.  Denies any bull's-eye rash or pus drainage from the site.  States other than minor itching, she feels normal.   ROS:   Constitutional: Negative for chills, fatigue and fever.  HENT: Negative for congestion, ear pain, sinus pain and sore throat.   Eyes:  Negative.   Respiratory: Negative for cough, shortness of breath and wheezing.   Cardiovascular: Negative for chest pain, palpitations and leg swelling.  Gastrointestinal: Negative for abdominal pain, diarrhea, nausea and vomiting.  Genitourinary: Negative for dysuria, frequency and urgency.  Musculoskeletal: Negative for arthralgias and myalgias.  Skin: Negative for color change, pallor and rash.  Neurological: Negative for syncope, light-headedness and headaches.  Psychiatric/Behavioral: The patient is not nervous/anxious.    Past Surgical History:  Procedure Laterality Date  . CESAREAN SECTION  1985   FTP  . KIDNEY STONE SURGERY     laser surgery  . LAPAROSCOPIC APPENDECTOMY N/A 05/10/2018   Procedure: APPENDECTOMY LAPAROSCOPIC;  Surgeon: Excell Seltzer, MD;  Location: [facility] OR;  Service: General;  Laterality: N/A;    Family History  Problem Relation Age of Onset  . Cancer Mother        Pancreatic cancer   . Heart disease Father   . Breast cancer Neg Hx    Social History   Tobacco Use  . Smoking status: Never Smoker  . Smokeless tobacco: Never Used  Substance Use Topics  . Alcohol use: Yes    Alcohol/week: 0.0 standard drinks    Comment: couple drinks a week     Current Outpatient Medications:  .  acetaminophen (TYLENOL) 325 MG tablet, Take 3 tablets (975 mg total) by mouth every 6 (six) hours as needed., Disp: , Rfl:  .  ibuprofen (MOTRIN IB) 200 MG tablet, Take 2-4 tablets (400-800 mg total) by mouth every 8 (  eight) hours as needed. (Patient taking differently: Take 400 mg by mouth every 8 (eight) hours as needed for headache. ), Disp: 1 tablet, Rfl: 0 .  mupirocin ointment (BACTROBAN) 2 %, Apply 1 application topically 2 (two) times daily., Disp: 22 g, Rfl: 1  EXAM:  GENERAL: alert, oriented, appears well and in no acute distress  HEENT: atraumatic, conjunttiva clear, no obvious abnormalities on inspection of external nose and ears  NECK: normal movements of the  head and neck  LUNGS: on inspection no signs of respiratory distress, breathing rate appears normal, no obvious gross SOB, gasping or wheezing  SKIN: Patient unable to show me the bite site on her back over video chat as she is by herself.  States as far she can tell by looking in the mirror this morning, she does not see any rash or drainage from area  CV: no obvious cyanosis  MS: moves all visible extremities without noticeable abnormality  PSYCH/NEURO: pleasant and cooperative, no obvious depression or anxiety, speech and thought processing grossly intact  ASSESSMENT AND PLAN:  Discussed the following assessment and plan:  Tick bite of back- due to tick possibly being on patient anywhere from 2 to 3 days, we will prophylactically treat her with a course of doxycycline to cover for any tickborne illnesses such as [facility] and spotted fever and or Lyme disease.  Advised to monitor cell for any development of symptoms like a bull's-eye rash, body aches, fever and chills and make office aware if any of these occur.   I discussed the assessment and treatment plan with the patient. The patient was provided an opportunity to ask questions and all were answered. The patient agreed with the plan and demonstrated an understanding of the instructions.   The patient was advised to call back or seek an in-person evaluation if the symptoms worsen or if the condition fails to improve as anticipated.  Jodelle Green, FNP

## 2019-06-03 DIAGNOSIS — H524 Presbyopia: Secondary | ICD-10-CM | POA: Diagnosis not present

## 2019-06-03 DIAGNOSIS — H33302 Unspecified retinal break, left eye: Secondary | ICD-10-CM | POA: Diagnosis not present

## 2019-06-03 DIAGNOSIS — H2513 Age-related nuclear cataract, bilateral: Secondary | ICD-10-CM | POA: Diagnosis not present

## 2019-06-10 DIAGNOSIS — D259 Leiomyoma of uterus, unspecified: Secondary | ICD-10-CM | POA: Diagnosis not present

## 2019-06-10 DIAGNOSIS — R9389 Abnormal findings on diagnostic imaging of other specified body structures: Secondary | ICD-10-CM | POA: Diagnosis not present

## 2019-06-10 DIAGNOSIS — Z01419 Encounter for gynecological examination (general) (routine) without abnormal findings: Secondary | ICD-10-CM | POA: Diagnosis not present

## 2019-06-10 DIAGNOSIS — Z6831 Body mass index (BMI) 31.0-31.9, adult: Secondary | ICD-10-CM | POA: Diagnosis not present

## 2019-08-04 ENCOUNTER — Other Ambulatory Visit: Payer: Self-pay | Admitting: Dermatology

## 2019-08-04 DIAGNOSIS — D485 Neoplasm of uncertain behavior of skin: Secondary | ICD-10-CM | POA: Diagnosis not present

## 2019-08-04 DIAGNOSIS — L821 Other seborrheic keratosis: Secondary | ICD-10-CM | POA: Diagnosis not present

## 2019-08-04 DIAGNOSIS — D229 Melanocytic nevi, unspecified: Secondary | ICD-10-CM | POA: Diagnosis not present

## 2019-09-02 ENCOUNTER — Other Ambulatory Visit: Payer: Self-pay | Admitting: Obstetrics & Gynecology

## 2019-09-02 DIAGNOSIS — Z1231 Encounter for screening mammogram for malignant neoplasm of breast: Secondary | ICD-10-CM

## 2019-10-28 ENCOUNTER — Ambulatory Visit
Admission: RE | Admit: 2019-10-28 | Discharge: 2019-10-28 | Disposition: A | Discharge status: Home / Self Care | Payer: BC Managed Care – PPO | Source: Ambulatory Visit | Attending: Obstetrics & Gynecology | Admitting: Obstetrics & Gynecology

## 2019-10-28 ENCOUNTER — Other Ambulatory Visit: Payer: Self-pay

## 2019-10-28 DIAGNOSIS — Z1231 Encounter for screening mammogram for malignant neoplasm of breast: Secondary | ICD-10-CM | POA: Diagnosis not present

## 2019-12-08 ENCOUNTER — Other Ambulatory Visit: Payer: Self-pay | Admitting: Family

## 2019-12-08 ENCOUNTER — Encounter: Payer: Self-pay | Admitting: Family

## 2019-12-08 DIAGNOSIS — Z1152 Encounter for screening for COVID-19: Secondary | ICD-10-CM

## 2019-12-09 ENCOUNTER — Other Ambulatory Visit
Admission: RE | Admit: 2019-12-09 | Discharge: 2019-12-09 | Disposition: A | Discharge status: Home / Self Care | Payer: BC Managed Care – PPO | Source: Ambulatory Visit | Attending: Family | Admitting: Family

## 2019-12-09 DIAGNOSIS — Z0184 Encounter for antibody response examination: Secondary | ICD-10-CM | POA: Diagnosis not present

## 2019-12-09 DIAGNOSIS — Z20822 Contact with and (suspected) exposure to covid-19: Secondary | ICD-10-CM | POA: Insufficient documentation

## 2019-12-10 LAB — SAR COV2 SEROLOGY (COVID19)AB(IGG),IA: SARS-CoV-2 Ab, IgG: NONREACTIVE

## 2020-04-15 DIAGNOSIS — R42 Dizziness and giddiness: Secondary | ICD-10-CM | POA: Diagnosis not present

## 2020-05-28 ENCOUNTER — Encounter: Payer: Self-pay | Admitting: Family

## 2020-05-31 MED ORDER — AMOXICILLIN 500 MG PO CAPS: 2000.0000 mg | ORAL_CAPSULE | Freq: Once | ORAL | 0 refills | Status: AC

## 2020-07-20 ENCOUNTER — Telehealth: Payer: Self-pay | Admitting: Family

## 2020-07-20 ENCOUNTER — Encounter: Payer: Self-pay | Admitting: Family

## 2020-07-20 DIAGNOSIS — Z1159 Encounter for screening for other viral diseases: Secondary | ICD-10-CM | POA: Diagnosis not present

## 2020-07-20 NOTE — Telephone Encounter (Signed)
Call pt HFMD is contagious, less common in adults, however adults can absolutely get. Sxs include fever, blister like rash more common hands, mouth, feet, abdomen, buttucks, soles of feet however it can be legs and arms as well.  If she has any rash, fever, general malaise, I would err on side of caution and not be around new born as they are immunocompromised.   If she has HFMD then she would need to stay away from newborn until all lesions dried, scabbed and no fever or new lesions which is can between 5-10 days depending on course. It is viral so antibiotics are given.   If she doesn't have any symptoms and it has been 3-4 days and more since contact, then unlikely that she has.

## 2020-07-20 NOTE — Telephone Encounter (Signed)
Called Lisa Gallegos and gave her the provider message. Salam verbalized understanding and had no further questions.

## 2020-07-20 NOTE — Telephone Encounter (Signed)
Called and spoke to Harlan about her concerns with Hand foot and Mouth virus. Please see telephone call with Provider message made on 07/20/20.

## 2020-07-20 NOTE — Telephone Encounter (Signed)
Pt called and wanted to get more info on hand, foot and mouth Grandson was exposed at Daycare and she was around him and has a new grand baby due on 10/8

## 2020-09-17 DIAGNOSIS — A6000 Herpesviral infection of urogenital system, unspecified: Secondary | ICD-10-CM | POA: Diagnosis not present

## 2020-09-17 DIAGNOSIS — R9389 Abnormal findings on diagnostic imaging of other specified body structures: Secondary | ICD-10-CM | POA: Diagnosis not present

## 2020-09-17 DIAGNOSIS — Z1151 Encounter for screening for human papillomavirus (HPV): Secondary | ICD-10-CM | POA: Diagnosis not present

## 2020-09-17 DIAGNOSIS — Z01419 Encounter for gynecological examination (general) (routine) without abnormal findings: Secondary | ICD-10-CM | POA: Diagnosis not present

## 2020-09-17 DIAGNOSIS — Z6828 Body mass index (BMI) 28.0-28.9, adult: Secondary | ICD-10-CM | POA: Diagnosis not present

## 2020-10-29 ENCOUNTER — Other Ambulatory Visit: Payer: Self-pay | Admitting: Obstetrics & Gynecology

## 2020-10-29 DIAGNOSIS — Z1231 Encounter for screening mammogram for malignant neoplasm of breast: Secondary | ICD-10-CM

## 2020-11-15 ENCOUNTER — Other Ambulatory Visit: Payer: Self-pay

## 2020-11-15 ENCOUNTER — Ambulatory Visit
Admission: RE | Admit: 2020-11-15 | Discharge: 2020-11-15 | Disposition: A | Discharge status: Home / Self Care | Payer: BC Managed Care – PPO | Source: Ambulatory Visit

## 2020-11-15 DIAGNOSIS — Z1231 Encounter for screening mammogram for malignant neoplasm of breast: Secondary | ICD-10-CM

## 2020-11-16 ENCOUNTER — Encounter: Payer: Self-pay | Admitting: Family

## 2020-11-16 ENCOUNTER — Other Ambulatory Visit: Payer: Self-pay | Admitting: Obstetrics & Gynecology

## 2020-11-16 DIAGNOSIS — C801 Malignant (primary) neoplasm, unspecified: Secondary | ICD-10-CM

## 2020-11-16 DIAGNOSIS — R928 Other abnormal and inconclusive findings on diagnostic imaging of breast: Secondary | ICD-10-CM

## 2020-11-16 HISTORY — DX: Malignant (primary) neoplasm, unspecified: C80.1

## 2020-11-29 ENCOUNTER — Ambulatory Visit
Admission: RE | Admit: 2020-11-29 | Discharge: 2020-11-29 | Disposition: A | Discharge status: Home / Self Care | Payer: BC Managed Care – PPO | Source: Ambulatory Visit | Attending: Obstetrics & Gynecology | Admitting: Obstetrics & Gynecology

## 2020-11-29 ENCOUNTER — Other Ambulatory Visit: Payer: Self-pay | Admitting: Obstetrics & Gynecology

## 2020-11-29 ENCOUNTER — Other Ambulatory Visit: Payer: Self-pay

## 2020-11-29 DIAGNOSIS — R928 Other abnormal and inconclusive findings on diagnostic imaging of breast: Secondary | ICD-10-CM

## 2020-11-29 DIAGNOSIS — R922 Inconclusive mammogram: Secondary | ICD-10-CM | POA: Diagnosis not present

## 2020-11-29 DIAGNOSIS — N6489 Other specified disorders of breast: Secondary | ICD-10-CM | POA: Diagnosis not present

## 2020-11-30 ENCOUNTER — Ambulatory Visit
Admission: RE | Admit: 2020-11-30 | Discharge: 2020-11-30 | Disposition: A | Discharge status: Home / Self Care | Payer: BC Managed Care – PPO | Source: Ambulatory Visit | Attending: Obstetrics & Gynecology | Admitting: Obstetrics & Gynecology

## 2020-11-30 ENCOUNTER — Other Ambulatory Visit: Payer: Self-pay | Admitting: Diagnostic Radiology

## 2020-11-30 DIAGNOSIS — R928 Other abnormal and inconclusive findings on diagnostic imaging of breast: Secondary | ICD-10-CM

## 2020-11-30 DIAGNOSIS — C50412 Malignant neoplasm of upper-outer quadrant of left female breast: Secondary | ICD-10-CM | POA: Diagnosis not present

## 2020-11-30 DIAGNOSIS — C50812 Malignant neoplasm of overlapping sites of left female breast: Secondary | ICD-10-CM | POA: Diagnosis not present

## 2020-12-02 ENCOUNTER — Telehealth: Payer: Self-pay | Admitting: Oncology

## 2020-12-02 NOTE — Telephone Encounter (Signed)
Spoke to patient to confirm morning [facility] appointment for 2/23, packet will be emailed to patient

## 2020-12-03 ENCOUNTER — Encounter: Payer: Self-pay | Admitting: *Deleted

## 2020-12-03 DIAGNOSIS — Z170 Estrogen receptor positive status [ER+]: Secondary | ICD-10-CM | POA: Insufficient documentation

## 2020-12-03 DIAGNOSIS — C50412 Malignant neoplasm of upper-outer quadrant of left female breast: Secondary | ICD-10-CM | POA: Insufficient documentation

## 2020-12-03 HISTORY — DX: Estrogen receptor positive status (ER+): Z17.0

## 2020-12-08 ENCOUNTER — Other Ambulatory Visit: Payer: BC Managed Care – PPO

## 2020-12-08 ENCOUNTER — Ambulatory Visit: Payer: BC Managed Care – PPO | Admitting: Physical Therapy

## 2020-12-08 ENCOUNTER — Ambulatory Visit: Payer: BC Managed Care – PPO | Admitting: Oncology

## 2020-12-08 ENCOUNTER — Ambulatory Visit: Payer: BC Managed Care – PPO | Admitting: Radiation Oncology

## 2020-12-09 DIAGNOSIS — H40053 Ocular hypertension, bilateral: Secondary | ICD-10-CM | POA: Diagnosis not present

## 2020-12-09 DIAGNOSIS — H2513 Age-related nuclear cataract, bilateral: Secondary | ICD-10-CM | POA: Diagnosis not present

## 2020-12-09 DIAGNOSIS — H5203 Hypermetropia, bilateral: Secondary | ICD-10-CM | POA: Diagnosis not present

## 2020-12-09 DIAGNOSIS — H33302 Unspecified retinal break, left eye: Secondary | ICD-10-CM | POA: Diagnosis not present

## 2020-12-10 ENCOUNTER — Ambulatory Visit: Payer: BC Managed Care – PPO | Admitting: Radiation Oncology

## 2020-12-10 ENCOUNTER — Encounter: Payer: Self-pay | Admitting: *Deleted

## 2020-12-14 NOTE — Progress Notes (Signed)
[name] NOTE  Patient Care Team: Burnard Hawthorne, FNP as PCP - General (Family Medicine) Rockwell Germany, RN as Oncology Nurse Navigator Mauro Kaufmann, RN as Oncology Nurse Navigator Coralie Keens, MD as Consulting Physician (General Surgery) Magrinat, Virgie Dad, MD as Consulting Physician (Oncology) Kyung Rudd, MD as Consulting Physician (Radiation Oncology) Rolm Bookbinder, MD as Consulting Physician (General Surgery) Nicholas Lose, MD as Consulting Physician (Hematology and Oncology)  CHIEF COMPLAINTS/PURPOSE OF CONSULTATION:  Newly diagnosed breast cancer  HISTORY OF PRESENTING ILLNESS:  Lisa Gallegos 65 y.o. female is here because of recent diagnosis of invasive ductal carcinoma of the left breast. Screening mammogram on 11/15/20 showed a left breast mass. Diagnostic mammogram and [name] on 11/29/20 showed a 0.5cm mass at the 12 o'clock position. Biopsy on 11/30/20 showed invasive ductal carcinoma, grade 2, HER-2 equivocal by IHC, negative by FISH (ratio 1.26), ER+ >95%, PR+ 40%, Ki67 5%. She presents to the clinic today for initial evaluation and discussion of treatment options.   I reviewed her records extensively and collaborated the history with the patient.  SUMMARY OF ONCOLOGIC HISTORY: Oncology History  Malignant neoplasm of upper-outer quadrant of left breast in female, estrogen receptor positive ([place])  12/03/2020 Initial Diagnosis   Screening mammogram showed a left breast mass. Diagnostic mammogram and US showed a 0.5cm mass at the 12 o'clock position. Biopsy showed IDC, grade 2, HER-2 equivocal by IHC, negative by FISH (ratio 1.26), ER+ >95%, PR+ 40%, Ki67 5%.     MEDICAL HISTORY:  No past medical history on file.  SURGICAL HISTORY: Past Surgical History:  Procedure Laterality Date  . CESAREAN SECTION  1985   FTP  . KIDNEY STONE SURGERY     laser surgery  . LAPAROSCOPIC APPENDECTOMY N/A 05/10/2018   Procedure: APPENDECTOMY  LAPAROSCOPIC;  Surgeon: Excell Seltzer, MD;  Location: [facility];  Service: General;  Laterality: N/A;    SOCIAL HISTORY: Social History   Socioeconomic History  . Marital status: Single    Spouse name: Not on file  . Number of children: Not on file  . Years of education: Not on file  . Highest education level: Not on file  Occupational History  . Not on file  Tobacco Use  . Smoking status: Never Smoker  . Smokeless tobacco: Never Used  Substance and Sexual Activity  . Alcohol use: Yes    Alcohol/week: 0.0 standard drinks    Comment: couple drinks a week   . Drug use: No  . Sexual activity: Not on file  Other Topics Concern  . Not on file  Social History Narrative   Lives in [place] with fiance. 2 dogs       Work - Personal assistant.      Diet - chicken and fish with veggies, avoids red meat. Vegetarian to bring cholesterol down as doesn't want to be treated with cholesterol medications if she can avoid it.       Social Determinants of Health   Financial Resource Strain: Low Risk   . Difficulty of Paying Living Expenses: Not hard at all  Food Insecurity: No Food Insecurity  . Worried About Charity fundraiser in the Last Year: Never true  . Ran Out of Food in the Last Year: Never true  Transportation Needs: No Transportation Needs  . Lack of Transportation (Medical): No  . Lack of Transportation (Non-Medical): No  Physical Activity: Not on file  Stress: Not on file  Social Connections: Not on file  Intimate Partner Violence: Not on file    FAMILY HISTORY: Family History  Problem Relation Age of Onset  . Cancer Mother        Pancreatic cancer   . Heart disease Father   . Breast cancer Neg Hx     ALLERGIES:  has No Known Allergies.  MEDICATIONS:  No current outpatient medications on file.   No current facility-administered medications for this visit.    REVIEW OF SYSTEMS:     All other systems were reviewed with the patient and are  negative.  PHYSICAL EXAMINATION: ECOG PERFORMANCE STATUS: 1 - Symptomatic but completely ambulatory  Vitals:   12/15/20 0923  BP: (!) 152/73  Pulse: 78  Resp: 20  Temp: 98.1 F (36.7 C)  SpO2: 100%   Filed Weights   12/15/20 0923  Weight: 171 lb 11.2 oz (77.9 kg)       LABORATORY DATA:  I have reviewed the data as listed Lab Results  Component Value Date   WBC 6.4 12/15/2020   HGB 13.6 12/15/2020   HCT 42.5 12/15/2020   MCV 88.7 12/15/2020   PLT 267 12/15/2020   Lab Results  Component Value Date   NA 141 12/15/2020   K 4.5 12/15/2020   CL 105 12/15/2020   CO2 28 12/15/2020    RADIOGRAPHIC STUDIES: I have personally reviewed the radiological reports and agreed with the findings in the report.  ASSESSMENT AND PLAN:  Malignant neoplasm of upper-outer quadrant of left breast in female, estrogen receptor positive ([place]) 12/03/2020: Screening mammogram showed a left breast mass. Diagnostic mammogram and US showed a 0.5cm mass at the 12 o'clock position. Biopsy showed IDC, grade 2, HER-2 equivocal by IHC, negative by FISH (ratio 1.26), ER+ >95%, PR+ 40%, Ki67 5%.  Pathology and radiology counseling:Discussed with the patient, the details of pathology including the type of breast cancer,the clinical staging, the significance of ER, PR and HER-2/neu receptors and the implications for treatment. After reviewing the pathology in detail, we proceeded to discuss the different treatment options between surgery, radiation, chemotherapy, antiestrogen therapies.  Recommendations: 1. Breast conserving surgery followed by 2. Oncotype DX testing to determine if chemotherapy would be of any benefit followed by 3. Adjuvant radiation therapy followed by 4. Adjuvant antiestrogen therapy  Oncotype counseling: I discussed Oncotype DX test. I explained to the patient that this is a 21 gene panel to evaluate patient tumors DNA to calculate recurrence score. This would help determine whether  patient has high risk or low risk breast cancer. She understands that if her tumor was found to be high risk, she would benefit from systemic chemotherapy. If low risk, no need of chemotherapy.  Return to clinic after surgery to discuss final pathology report and then determine if Oncotype DX testing will need to be sent.     All questions were answered. The patient knows to call the clinic with any problems, questions or concerns.   Rulon Eisenmenger, MD, MPH 12/15/2020    I, Molly Dorshimer, am acting as scribe for Nicholas Lose, MD.  I have reviewed the above documentation for accuracy and completeness, and I agree with the above.

## 2020-12-15 ENCOUNTER — Encounter: Payer: Self-pay | Admitting: Licensed Clinical Social Worker

## 2020-12-15 ENCOUNTER — Other Ambulatory Visit: Payer: Self-pay

## 2020-12-15 ENCOUNTER — Other Ambulatory Visit: Payer: Self-pay | Admitting: General Surgery

## 2020-12-15 ENCOUNTER — Ambulatory Visit: Payer: BC Managed Care – PPO | Attending: General Surgery | Admitting: Physical Therapy

## 2020-12-15 ENCOUNTER — Ambulatory Visit (HOSPITAL_BASED_OUTPATIENT_CLINIC_OR_DEPARTMENT_OTHER): Payer: BC Managed Care – PPO | Admitting: Genetic Counselor

## 2020-12-15 ENCOUNTER — Encounter: Payer: Self-pay | Admitting: Physical Therapy

## 2020-12-15 ENCOUNTER — Inpatient Hospital Stay: Payer: BC Managed Care – PPO

## 2020-12-15 ENCOUNTER — Encounter: Payer: Self-pay | Admitting: *Deleted

## 2020-12-15 ENCOUNTER — Inpatient Hospital Stay: Payer: BC Managed Care – PPO | Attending: Hematology and Oncology | Admitting: Hematology and Oncology

## 2020-12-15 ENCOUNTER — Ambulatory Visit
Admission: RE | Admit: 2020-12-15 | Discharge: 2020-12-15 | Disposition: A | Discharge status: Home / Self Care | Payer: BC Managed Care – PPO | Source: Ambulatory Visit | Attending: Radiation Oncology | Admitting: Radiation Oncology

## 2020-12-15 DIAGNOSIS — R293 Abnormal posture: Secondary | ICD-10-CM | POA: Diagnosis not present

## 2020-12-15 DIAGNOSIS — Z8249 Family history of ischemic heart disease and other diseases of the circulatory system: Secondary | ICD-10-CM | POA: Insufficient documentation

## 2020-12-15 DIAGNOSIS — Z800 Family history of malignant neoplasm of digestive organs: Secondary | ICD-10-CM | POA: Insufficient documentation

## 2020-12-15 DIAGNOSIS — Z170 Estrogen receptor positive status [ER+]: Secondary | ICD-10-CM

## 2020-12-15 DIAGNOSIS — C50412 Malignant neoplasm of upper-outer quadrant of left female breast: Secondary | ICD-10-CM

## 2020-12-15 DIAGNOSIS — Z8049 Family history of malignant neoplasm of other genital organs: Secondary | ICD-10-CM | POA: Diagnosis not present

## 2020-12-15 LAB — CMP (CANCER CENTER ONLY)
ALT: 36 U/L (ref 0–44)
AST: 26 U/L (ref 15–41)
Albumin: 4.2 g/dL (ref 3.5–5.0)
Alkaline Phosphatase: 98 U/L (ref 38–126)
Anion gap: 8 (ref 5–15)
BUN: 18 mg/dL (ref 8–23)
CO2: 28 mmol/L (ref 22–32)
Calcium: 9.5 mg/dL (ref 8.9–10.3)
Chloride: 105 mmol/L (ref 98–111)
Creatinine: 0.88 mg/dL (ref 0.44–1.00)
GFR, Estimated: >60 mL/min (ref >60)
Glucose, Bld: 98 mg/dL (ref 70–99)
Potassium: 4.5 mmol/L (ref 3.5–5.1)
Sodium: 141 mmol/L (ref 135–145)
Total Bilirubin: 0.4 mg/dL (ref 0.3–1.2)
Total Protein: 7.5 g/dL (ref 6.5–8.1)

## 2020-12-15 LAB — CBC WITH DIFFERENTIAL (CANCER CENTER ONLY)
Abs Immature Granulocytes: 0.01 10*3/uL (ref 0.00–0.07)
Basophils Absolute: 0 10*3/uL (ref 0.0–0.1)
Basophils Relative: 1 %
Eosinophils Absolute: 0.2 10*3/uL (ref 0.0–0.5)
Eosinophils Relative: 3 %
HCT: 42.5 % (ref 36.0–46.0)
Hemoglobin: 13.6 g/dL (ref 12.0–15.0)
Immature Granulocytes: 0 %
Lymphocytes Relative: 39 %
Lymphs Abs: 2.5 10*3/uL (ref 0.7–4.0)
MCH: 28.4 pg (ref 26.0–34.0)
MCHC: 32 g/dL (ref 30.0–36.0)
MCV: 88.7 fL (ref 80.0–100.0)
Monocytes Absolute: 0.6 10*3/uL (ref 0.1–1.0)
Monocytes Relative: 10 %
Neutro Abs: 3.1 10*3/uL (ref 1.7–7.7)
Neutrophils Relative %: 47 %
Platelet Count: 267 10*3/uL (ref 150–400)
RBC: 4.79 MIL/uL (ref 3.87–5.11)
RDW: 12.6 % (ref 11.5–15.5)
WBC Count: 6.4 10*3/uL (ref 4.0–10.5)
nRBC: 0 % (ref 0.0–0.2)

## 2020-12-15 LAB — GENETIC SCREENING ORDER

## 2020-12-15 NOTE — Progress Notes (Signed)
Radiation Oncology         [phone] ________________________________  Name: Lisa Gallegos        MRN: [id number]  Date of Service: 12/15/2020 DOB: August 03, 1956  NL:[id number], Yvetta Coder, FNP  Arnett, Yvetta Coder, FNP     REFERRING PHYSICIAN: Burnard Hawthorne, FNP   DIAGNOSIS: The encounter diagnosis was Malignant neoplasm of upper-outer quadrant of left breast in female, estrogen receptor positive ([place]).   HISTORY OF PRESENT ILLNESS: Lisa Gallegos is a 65 y.o. female seen in the multidisciplinary breast clinic for a new diagnosis of left breast cancer. The patient was noted to have a screening detected mass in the left breast. Diagnostic imaging revealed a 5 mm mass in the 12:00 position. Her axilla is negative for adenopathy. A biopsy on 11/30/20 revealed a grade 2 invasive ductal carcinoma, and her ER/PR was positive, HER2 negative by FISH and her Ki 67 was 5%. She's seen today to discuss treatment of her cancer.   PREVIOUS RADIATION THERAPY: No   PAST MEDICAL HISTORY: No past medical history on file.     PAST SURGICAL HISTORY: Past Surgical History:  Procedure Laterality Date  . CESAREAN SECTION  1985   FTP  . KIDNEY STONE SURGERY     laser surgery  . LAPAROSCOPIC APPENDECTOMY N/A 05/10/2018   Procedure: APPENDECTOMY LAPAROSCOPIC;  Surgeon: Excell Seltzer, MD;  Location: [facility] OR;  Service: General;  Laterality: N/A;     FAMILY HISTORY:  Family History  Problem Relation Age of Onset  . Cancer Mother        Pancreatic cancer   . Heart disease Father   . Breast cancer Neg Hx      SOCIAL HISTORY:  reports that she has never smoked. She has never used smokeless tobacco. She reports current alcohol use. She reports that she does not use drugs. The patient is in a relationship with her life partner Ron. Her daughter Elmyra Ricks joins [name] by phone as well. She works as an Scientist, water quality for [employer].   ALLERGIES: Patient has no known  allergies.   MEDICATIONS:  No current outpatient medications on file.   No current facility-administered medications for this encounter.     REVIEW OF SYSTEMS: On review of systems, the patient reports that she is doing well overall. She denies any chest pain, shortness of breath, cough, fevers, chills, night sweats, unintended weight changes. She denies any bowel or bladder disturbances, and denies abdominal pain, nausea or vomiting. She wears contacts but has not had any acute changes with vision. She denies any new musculoskeletal or joint aches or pains, new skin lesions or concerns. A complete review of systems is obtained and is otherwise negative.      PHYSICAL EXAM:  Wt Readings from Last 3 Encounters:  12/15/20 171 lb 11.2 oz (77.9 kg)  12/09/18 186 lb 9.6 oz (84.6 kg)  11/29/18 175 lb (79.4 kg)   Temp Readings from Last 3 Encounters:  12/15/20 98.1 F (36.7 C) (Temporal)  12/09/18 98.8 F (37.1 C) (Oral)  11/29/18 98.9 F (37.2 C) (Oral)   BP Readings from Last 3 Encounters:  12/15/20 (!) 152/73  12/09/18 (!) 142/84  11/29/18 136/63   Pulse Readings from Last 3 Encounters:  12/15/20 78  12/09/18 75  11/29/18 74    In general this is a well appearing caucasian female in no acute distress. She's alert and oriented x4 and appropriate throughout the examination. Cardiopulmonary assessment is negative for acute distress and she  exhibits normal effort. Bilateral breast exam is deferred.    ECOG =0  0 - Asymptomatic (Fully active, able to carry on all predisease activities without restriction)  1 - Symptomatic but completely ambulatory (Restricted in physically strenuous activity but ambulatory and able to carry out work of a light or sedentary nature. For example, light housework, office work)  2 - Symptomatic, <50% in bed during the day (Ambulatory and capable of all self care but unable to carry out any work activities. Up and about more than 50% of waking  hours)  3 - Symptomatic, >50% in bed, but not bedbound (Capable of only limited self-care, confined to bed or chair 50% or more of waking hours)  4 - Bedbound (Completely disabled. Cannot carry on any self-care. Totally confined to bed or chair)  5 - Death   Eustace Pen MM, Creech RH, Tormey DC, et al. [phone]). "Toxicity and response criteria of the [facility]". [place] Oncol. 5 (6): 649-55    LABORATORY DATA:  Lab Results  Component Value Date   WBC 6.4 12/15/2020   HGB 13.6 12/15/2020   HCT 42.5 12/15/2020   MCV 88.7 12/15/2020   PLT 267 12/15/2020   Lab Results  Component Value Date   NA 141 12/15/2020   K 4.5 12/15/2020   CL 105 12/15/2020   CO2 28 12/15/2020   Lab Results  Component Value Date   ALT 36 12/15/2020   AST 26 12/15/2020   ALKPHOS 98 12/15/2020   BILITOT 0.4 12/15/2020      RADIOGRAPHY: US BREAST LTD UNI LEFT INC AXILLA  Result Date: 11/29/2020 CLINICAL DATA:  65 year old female recalled from screening mammogram dated 11/15/2020 for a possible left breast mass. EXAM: DIGITAL DIAGNOSTIC UNILATERAL LEFT MAMMOGRAM WITH TOMOSYNTHESIS AND CAD; ULTRASOUND LEFT BREAST LIMITED TECHNIQUE: Left digital diagnostic mammography and breast tomosynthesis was performed. The images were evaluated with computer-aided detection.; Targeted ultrasound examination of the left breast was performed. COMPARISON:  Previous exam(s). ACR Breast Density Category b: There are scattered areas of fibroglandular density. FINDINGS: There is a persistent spiculated mass in the upper left breast at mid to posterior depth. Further evaluation with ultrasound was performed. Targeted ultrasound is performed, showing an irregular, hypoechoic mass with hyperechoic rim at the 12 o'clock position 4 cm from the nipple. It measures 5 x 4 x 3 mm. There is no definite internal vascularity. This correlates well with the mammographic finding. Evaluation of the left axilla demonstrates no  suspicious lymphadenopathy. IMPRESSION: 1. Suspicious left breast mass corresponding with the screening mammographic findings. Recommendation is for ultrasound-guided biopsy. 2. No suspicious left axillary lymphadenopathy. RECOMMENDATION: Ultrasound-guided biopsy of the left breast 12 o'clock. I have discussed the findings and recommendations with the patient. If applicable, a reminder letter will be sent to the patient regarding the next appointment. BI-RADS CATEGORY  4: Suspicious. Electronically Signed   By: Kristopher Oppenheim M.D.   On: 11/29/2020 15:11   MM DIAG BREAST TOMO UNI LEFT  Result Date: 11/29/2020 CLINICAL DATA:  65 year old female recalled from screening mammogram dated 11/15/2020 for a possible left breast mass. EXAM: DIGITAL DIAGNOSTIC UNILATERAL LEFT MAMMOGRAM WITH TOMOSYNTHESIS AND CAD; ULTRASOUND LEFT BREAST LIMITED TECHNIQUE: Left digital diagnostic mammography and breast tomosynthesis was performed. The images were evaluated with computer-aided detection.; Targeted ultrasound examination of the left breast was performed. COMPARISON:  Previous exam(s). ACR Breast Density Category b: There are scattered areas of fibroglandular density. FINDINGS: There is a persistent spiculated mass in the upper  left breast at mid to posterior depth. Further evaluation with ultrasound was performed. Targeted ultrasound is performed, showing an irregular, hypoechoic mass with hyperechoic rim at the 12 o'clock position 4 cm from the nipple. It measures 5 x 4 x 3 mm. There is no definite internal vascularity. This correlates well with the mammographic finding. Evaluation of the left axilla demonstrates no suspicious lymphadenopathy. IMPRESSION: 1. Suspicious left breast mass corresponding with the screening mammographic findings. Recommendation is for ultrasound-guided biopsy. 2. No suspicious left axillary lymphadenopathy. RECOMMENDATION: Ultrasound-guided biopsy of the left breast 12 o'clock. I have discussed the  findings and recommendations with the patient. If applicable, a reminder letter will be sent to the patient regarding the next appointment. BI-RADS CATEGORY  4: Suspicious. Electronically Signed   By: Kristopher Oppenheim M.D.   On: 11/29/2020 15:11   MM CLIP PLACEMENT LEFT  Result Date: 11/30/2020 CLINICAL DATA:  65 year old female status post ultrasound-guided biopsy of the left breast. EXAM: DIAGNOSTIC LEFT MAMMOGRAM POST STEREOTACTIC BIOPSY COMPARISON:  Previous exam(s). FINDINGS: Mammographic images were obtained following ultrasound guided biopsy of the left breast. The biopsy marking clip is in expected position at the site of biopsy. IMPRESSION: Appropriate positioning of the ribbon shaped biopsy marking clip at the site of biopsy in the upper left breast. Final Assessment: Post Procedure Mammograms for Marker Placement Electronically Signed   By: Kristopher Oppenheim M.D.   On: 11/30/2020 08:40   [name] LT BREAST BX W LOC DEV 1ST LESION IMG BX SPEC US GUIDE  Addendum Date: 12/01/2020   ADDENDUM REPORT: 12/01/2020 13:17 ADDENDUM: Pathology revealed GRADE II INVASIVE DUCTAL CARCINOMA of the LEFT breast, 12 o'clock. This was found to be concordant by Dr. Kristopher Oppenheim. Pathology results were discussed with the patient by telephone. The patient reported doing well after the biopsy with tenderness at the site. Post biopsy instructions and care were reviewed and questions were answered. The patient was encouraged to call [facility] for any additional concerns. The patient was referred to [facility] on December 08, 2020. Pathology results reported by Stacie Acres RN on 12/01/2020. Electronically Signed   By: Kristopher Oppenheim M.D.   On: 12/01/2020 13:17   Result Date: 12/01/2020 CLINICAL DATA:  65 year old female with a suspicious left breast mass. EXAM: ULTRASOUND GUIDED LEFT BREAST CORE NEEDLE BIOPSY COMPARISON:   Previous exam(s). PROCEDURE: I met with the patient and we discussed the procedure of ultrasound-guided biopsy, including benefits and alternatives. We discussed the high likelihood of a successful procedure. We discussed the risks of the procedure, including infection, bleeding, tissue injury, clip migration, and inadequate sampling. Informed written consent was given. The usual time-out protocol was performed immediately prior to the procedure. Lesion quadrant: Upper outer quadrant Using sterile technique and 1% Lidocaine as local anesthetic, under direct ultrasound visualization, a 14 gauge spring-loaded device was used to perform biopsy of a mass at the 12 o'clock position using a lateral approach. At the conclusion of the procedure a ribbon shaped tissue marker clip was deployed into the biopsy cavity. Follow up 2 view mammogram was performed and dictated separately. IMPRESSION: Ultrasound guided biopsy of the left breast. No apparent complications. Electronically Signed: By: Kristopher Oppenheim M.D. On: 11/30/2020 08:29       IMPRESSION/PLAN: 1. 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 Dr. Lisbeth Renshaw discusses the pathology findings and reviews the nature of left breast disease. The  consensus from the breast conference includes breast conservation with lumpectomy with sentinel node biopsy. Depending on the size of the final tumor measurements rendered by pathology, the tumor may be tested for Oncotype Dx score to determine a role for systemic therapy. Provided that chemotherapy is not indicated, the patient's course would then be followed by external radiotherapy to the breast  to reduce risks of local recurrence followed by antiestrogen therapy. We discussed the risks, benefits, short, and long term effects of radiotherapy, as well as the curative intent, and the patient is interested in proceeding. Dr. Lisbeth Renshaw discusses the delivery and logistics of radiotherapy and anticipates a  course of 4 or 6 1/2 weeks of radiotherapy. We will see her back a few weeks after surgery to discuss the simulation process and anticipate we starting radiotherapy about 4-6 weeks after surgery.  2. Possible genetic predisposition to malignancy. The patient is a candidate for genetic testing given her personal history and family history of pancreas cancer. She was offered referral and will meet with genetic testing today.    In a visit lasting 60 minutes, greater than 50% of the time was spent face to face reviewing her case, as well as in preparation of, discussing, and coordinating the patient's care.  The above documentation reflects my direct findings during this shared patient visit. Please see the separate note by Dr. Lisbeth Renshaw on this date for the remainder of the patient's plan of care.    Carola Rhine, [facility]    **Disclaimer: This note was dictated with voice recognition software. Similar sounding words can inadvertently be transcribed and this note may contain transcription errors which may not have been corrected upon publication of note.**

## 2020-12-15 NOTE — Progress Notes (Signed)
[place] Work  Initial Assessment   Genesee Nase is a 65 y.o. year old female accompanied by patient and partner, Ron. Clinical Social Work was referred by [facility] for assessment of psychosocial needs.   SDOH (Social Determinants of Health) assessments performed: Yes SDOH Interventions   Flowsheet Row Most Recent Value  SDOH Interventions   Food Insecurity Interventions Intervention Not Indicated  Financial Strain Interventions Intervention Not Indicated  Housing Interventions Intervention Not Indicated  Transportation Interventions Intervention Not Indicated      Distress Screen completed: Yes ONCBCN DISTRESS SCREENING 12/15/2020  Screening Type Initial Screening  Distress experienced in past week (1-10) 1  Emotional problem type Nervousness/Anxiety    Family/Social Information:  . Housing Arrangement: patient lives with life partner, Ron. Son Truddie Crumble- 36) lives in [place], daughter Elmyra Ricks) lives in [place]. Has 3 grandkids (6yo, 1.5yo, 66mo) . Family members/support persons in your life? Family and Friends/Colleagues . Transportation concerns: no  . Employment: Working full time as Scientist, water quality at [employer]. Income source: Employment. Boss and coworkers are supportive . Financial concerns: No o Type of concern: None . Food access concerns: no . Services Currently in place:  n/a  Coping/ Adjustment to diagnosis: . Patient understands treatment plan and what happens next? yes . Concerns about diagnosis and/or treatment: I'm not especially worried about anything . Patient reported stressors: some nervousness initially . Hopes and priorities: be able to get back to travelling . Patient enjoys time with family/ friends and travelling . Current coping skills/ strengths: Average or above average intelligence, Capable of independent living, Communication skills, Financial means, Motivation for treatment/growth and Supportive family/friends     SUMMARY: Current SDOH Barriers:  . No significant SDOH barriers today  Clinical Social Work Clinical Goal(s):  Marland Kitchen No clinical SW goals at this time  Interventions: . Discussed common feeling and emotions when being diagnosed with cancer, and the importance of support during treatment . Informed patient of the support team roles and support services at [facility] . Provided CSW contact information and encouraged patient to call with any questions or concerns   Follow Up Plan: Patient will contact CSW with any support or resource needs Patient verbalizes understanding of plan: Yes   Christeen Douglas , LCSW

## 2020-12-15 NOTE — Progress Notes (Signed)
REFERRING PROVIDER: Nicholas Lose, MD [address] [place],  [place] [address]  PRIMARY PROVIDER:  Burnard Hawthorne, FNP  PRIMARY REASON FOR VISIT:  Encounter Diagnoses  Name Primary?  . Malignant neoplasm of upper-outer quadrant of left breast in female, estrogen receptor positive ([place]) Yes  . Family history of pancreatic cancer     HISTORY OF PRESENT ILLNESS:   Lisa Gallegos, a 65 y.o. female, was seen for a Early cancer genetics consultation during the breast multidisciplinary clinic at the request of Dr. Lindi Adie due to a personal and family history of cancer.  Lisa Gallegos presents to clinic today with her partner, Ron, to discuss the possibility of a hereditary predisposition to cancer, to discuss genetic testing, and to further clarify her future cancer risks, as well as potential cancer risks for family members.   In February 2022, at the age of 51, Lisa Gallegos was diagnosed with invasive ductal carcinoma of the left breast (ER+/PR+/HER2-). The preliminary treatment plan includes breast conserving surgery, Oncotype to determine potential benefit of chemotherapy, adjuvant radiation, and anti-estrogens.   CANCER HISTORY:  Oncology History  Malignant neoplasm of upper-outer quadrant of left breast in female, estrogen receptor positive ([place])  12/03/2020 Initial Diagnosis   Screening mammogram showed a left breast mass. Diagnostic mammogram and US showed a 0.5cm mass at the 12 o'clock position. Biopsy showed IDC, grade 2, HER-2 equivocal by IHC, negative by FISH (ratio 1.26), ER+ >95%, PR+ 40%, Ki67 5%.     RISK FACTORS:  Menarche was at age 35.  First live birth at age 44.  OCP use for approximately 0 years.  Ovaries intact: yes.  Hysterectomy: no.  Menopausal status: postmenopausal.  HRT use: 0 years. Colonoscopy: yes; in 2013. Mammogram within the last year: yes. Up to date with pelvic exams: yes.   Past Surgical History:  Procedure Laterality Date  . CESAREAN  SECTION  1985   FTP  . KIDNEY STONE SURGERY     laser surgery  . LAPAROSCOPIC APPENDECTOMY N/A 05/10/2018   Procedure: APPENDECTOMY LAPAROSCOPIC;  Surgeon: Excell Seltzer, MD;  Location: [facility];  Service: General;  Laterality: N/A;    Social History   Socioeconomic History  . Marital status: Single    Spouse name: Not on file  . Number of children: Not on file  . Years of education: Not on file  . Highest education level: Not on file  Occupational History  . Not on file  Tobacco Use  . Smoking status: Never Smoker  . Smokeless tobacco: Never Used  Substance and Sexual Activity  . Alcohol use: Yes    Alcohol/week: 0.0 standard drinks    Comment: couple drinks a week   . Drug use: No  . Sexual activity: Not on file  Other Topics Concern  . Not on file  Social History Narrative   Lives in [place] with fiance. 2 dogs       Work - Personal assistant.      Diet - chicken and fish with veggies, avoids red meat. Vegetarian to bring cholesterol down as doesn't want to be treated with cholesterol medications if she can avoid it.       Social Determinants of Health   Financial Resource Strain: Not on file  Food Insecurity: Not on file  Transportation Needs: Not on file  Physical Activity: Not on file  Stress: Not on file  Social Connections: Not on file     FAMILY HISTORY:  We obtained a detailed,  4-generation family history.  Significant diagnoses are listed below: Family History  Problem Relation Age of Onset  . Pancreatic cancer Mother 20  . Lung cancer Maternal Uncle        dx after 59, smoking hx    Lisa Gallegos has one daughter and one son, both in their 31s without a history of cancer.  Lisa Gallegos has one brother and one sister, both without cancer. Lisa Gallegos mother was diagnosed with pancreatic cancer at age 34 and passed away at age 54.  She did not have genetic testing before she passed away.  Lisa Gallegos maternal aunt had a history of lung  cancer diagnosed after the age of 15.  No other maternal family history of cancer was reported.  Lisa Gallegos's father died at age 52 and did not have cancer.  She had limited information about her paternal family but did not report any paternal family history of cancer.  Lisa Gallegos is unaware of previous family history of genetic testing for hereditary cancer risks. Patient's maternal ancestors are of [place] descent, and paternal ancestors are of [place] descent. There is no reported Ashkenazi Jewish ancestry. There is no known consanguinity.  GENETIC COUNSELING ASSESSMENT: Lisa Gallegos is a 65 y.o. female with a personal and family history of cancer which is somewhat suggestive of a hereditary cancer syndrome and predisposition to cancer given the presence of related cancers in the family. We, therefore, discussed and recommended the following at today's visit.   DISCUSSION: We discussed that 5 - 10% of cancer is hereditary, with most cases of hereditary breast cancer associated with mutations in BRCA1/2.  There are other genes that can be associated with hereditary breast and pancreatic cancer syndromes.  Type of cancer risk and level of risk are gene-specific.  We discussed that testing is beneficial for several reasons including knowing how to follow individuals after completing their treatment, identifying whether potential treatment or surgical options would be beneficial, and understanding if other family members could be at risk for cancer and allowing them to undergo genetic testing.   We reviewed the characteristics, features and inheritance patterns of hereditary cancer syndromes. We also discussed genetic testing, including the appropriate family members to test, the process of testing, insurance coverage and turn-around-time for results. We recommended recommend Lisa Gallegos pursue genetic testing for a common hereditary cancers panel.   The CustomNext-Cancer+RNAinsight panel offered by [facility] includes sequencing and rearrangement analysis for the following 47 genes:  APC, ATM, AXIN2, BARD1, BMPR1A, BRCA1, BRCA2, BRIP1, CDH1, CDK4, CDKN2A, CHEK2, DICER1, EPCAM, GREM1, HOXB13, MEN1, MLH1, MSH2, MSH3, MSH6, MUTYH, NBN, NF1, NF2, NTHL1, PALB2, PMS2, POLD1, POLE, PTEN, RAD51C, RAD51D, RECQL, RET, SDHA, SDHAF2, SDHB, SDHC, SDHD, SMAD4, SMARCA4, STK11, TP53, TSC1, TSC2, and VHL.  RNA data is routinely analyzed for use in variant interpretation for all genes.  Based on Ms. Lochridge's personal and family history of cancer, she meets medical criteria for genetic testing. Despite that she meets criteria, she may still have an out of pocket cost.  PLAN: Despite our recommendation, Lisa Gallegos did not wish to pursue genetic testing at today's visit. We understand this decision and remain available to coordinate genetic testing at any time in the future. She is interested in considering genetic testing in the future after her cancer treatment. At this time, we recommend Lisa Gallegos continue to follow the cancer screening guidelines given by her oncology and primary healthcare providers.  Lastly, we encouraged Lisa Gallegos to remain in contact  with cancer genetics annually so that we can continuously update the family history and inform her of any changes in cancer genetics and testing that may be of benefit for this family.   Lisa Gallegos questions were answered to her satisfaction today. Our contact information was provided should additional questions or concerns arise. Thank you for the referral and allowing [name] to share in the care of your patient.   Ayleah Hofmeister M. Joette Catching, [place], [facility] Genetic Counselor [email or web address] (P) [phone]  The patient was seen for a total of 20 minutes in face-to-face genetic counseling.  Drs. Magrinat, Lindi Adie and/or Burr Medico were available to discuss this case as needed.  _______________________________________________________________________ For Office Staff:  Number of  people involved in session: 1 Was an Intern/ student involved with case: no

## 2020-12-15 NOTE — Therapy (Signed)
[place], [place], [address] Phone: [phone]   Fax:  [phone]  Physical Therapy Evaluation  Patient Details  Name: Lisa Gallegos MRN: [id number] Date of Birth: Jun 04, 1956 Referring Provider (PT): Dr. Rolm Bookbinder   Encounter Date: 12/15/2020   PT End of Session - 12/15/20 1432    Visit Number 1    Number of Visits 2    Date for PT Re-Evaluation 02/09/21    PT Start Time 0630    PT Stop Time 1223    PT Time Calculation (min) 24 min    Activity Tolerance Patient tolerated treatment well    Behavior During Therapy [facility] for tasks assessed/performed           History reviewed. No pertinent past medical history.  Past Surgical History:  Procedure Laterality Date  . CESAREAN SECTION  1985   FTP  . KIDNEY STONE SURGERY     laser surgery  . LAPAROSCOPIC APPENDECTOMY N/A 05/10/2018   Procedure: APPENDECTOMY LAPAROSCOPIC;  Surgeon: Excell Seltzer, MD;  Location: [facility];  Service: General;  Laterality: N/A;    There were no vitals filed for this visit.    Subjective Assessment - 12/15/20 1426    Subjective Patient reports she is here today to be seen by her medical team for her newly diagnosed left breast cancer.    Patient is accompained by: Family member    Pertinent History Patient was diagnosed on 11/15/2020 with left grade II invasive ductal carcinoma breast cancer. It measures 5 mm and is located in the upper outer quadrant. It is ER/PR positive and HER2 negative with a Ki67 of 5%.    Patient Stated Goals Reduce lymphedema risk and learn post op shoulder ROM HEP    Currently in Pain? No/denies              [facility] PT Assessment - 12/15/20 0001      Assessment   Medical Diagnosis Left breast cancer    Referring Provider (PT) Dr. Rolm Bookbinder    Onset Date/Surgical Date 11/15/20    Hand Dominance Right    Prior Therapy none      Precautions   Precautions Other (comment)     Precaution Comments active cancer      Restrictions   Weight Bearing Restrictions No      Balance Screen   Has the patient fallen in the past 6 months No    Has the patient had a decrease in activity level because of a fear of falling?  No    Is the patient reluctant to leave their home because of a fear of falling?  No      Home Social worker Private residence    Living Arrangements Other (Comment)   [name]   Available Help at Discharge Family      Prior Function   Level of Independence Independent    Vocation Full time employment    Vocation Requirements Admin Asst at [employer] She walks 1 miles each day at lunch (17 min)      Cognition   Overall Cognitive Status Within Functional Limits for tasks assessed      Posture/Postural Control   Posture/Postural Control Postural limitations    Postural Limitations Forward head;Rounded Shoulders      ROM / Strength   AROM / PROM / Strength AROM;Strength      AROM   Overall AROM Comments Cervical AROM is  WNL    AROM Assessment Site Shoulder    Right/Left Shoulder Right;Left    Right Shoulder Extension 45 Degrees    Right Shoulder Flexion 17 Degrees    Right Shoulder ABduction 162 Degrees    Right Shoulder Internal Rotation 67 Degrees    Right Shoulder External Rotation 81 Degrees    Left Shoulder Extension 45 Degrees    Left Shoulder Flexion 142 Degrees    Left Shoulder ABduction 152 Degrees    Left Shoulder Internal Rotation 73 Degrees    Left Shoulder External Rotation 82 Degrees      Strength   Overall Strength Within functional limits for tasks performed             LYMPHEDEMA/ONCOLOGY QUESTIONNAIRE - 12/15/20 0001      Type   Cancer Type Left breast cancer      Lymphedema Assessments   Lymphedema Assessments Upper extremities      Right Upper Extremity Lymphedema   10 cm Proximal to Olecranon Process 29.5 cm    Olecranon Process 25 cm    10 cm Proximal to Ulnar  Styloid Process 23.7 cm    Just Proximal to Ulnar Styloid Process 15.5 cm    Across Hand at [employer] 18.4 cm    At [place] of 2nd Digit 6.3 cm      Left Upper Extremity Lymphedema   10 cm Proximal to Olecranon Process 29.7 cm    Olecranon Process 25.7 cm    10 cm Proximal to Ulnar Styloid Process 23.1 cm    Just Proximal to Ulnar Styloid Process 15.7 cm    Across Hand at [employer] 18.8 cm    At [place] of 2nd Digit 5.9 cm           L-DEX FLOWSHEETS - 12/15/20 1400      L-DEX LYMPHEDEMA SCREENING   Measurement Type Unilateral    L-DEX MEASUREMENT EXTREMITY Upper Extremity    POSITION  Standing    DOMINANT SIDE Right    At Risk Side Left    BASELINE SCORE (UNILATERAL) 2.2           The patient was assessed using the L-Dex machine today to produce a lymphedema index baseline score. The patient will be reassessed on a regular basis (typically every 3 months) to obtain new L-Dex scores. If the score is > 6.5 points away from his/her baseline score indicating onset of subclinical lymphedema, it will be recommended to wear a compression garment for 4 weeks, 12 hours per day and then be reassessed. If the score continues to be > 6.5 points from baseline at reassessment, we will initiate lymphedema treatment. Assessing in this manner has a 95% rate of preventing clinically significant lymphedema.      Katina Dung - 12/15/20 0001    Open a tight or new jar No difficulty    Do heavy household chores (wash walls, wash floors) No difficulty    Carry a shopping bag or briefcase No difficulty    Wash your back No difficulty    Use a knife to cut food No difficulty    Recreational activities in which you take some force or impact through your arm, shoulder, or hand (golf, hammering, tennis) No difficulty    During the past week, to what extent has your arm, shoulder or hand problem interfered with your normal social activities with family, friends, neighbors, or groups? Not at all     During the past week,  to what extent has your arm, shoulder or hand problem limited your work or other regular daily activities Not at all    Arm, shoulder, or hand pain. None    Tingling (pins and needles) in your arm, shoulder, or hand None    Difficulty Sleeping No difficulty    DASH Score 0 %            Objective measurements completed on examination: See above findings.      Patient was instructed today in a home exercise program today for post op shoulder range of motion. These included active assist shoulder flexion in sitting, scapular retraction, wall walking with shoulder abduction, and hands behind head external rotation.  She was encouraged to do these twice a day, holding 3 seconds and repeating 5 times when permitted by her physician.             PT Education - 12/15/20 1431    Education Details Lymphedema risk reduction and post op shoulder ROM HEP    Person(s) Educated Patient;Other (comment)   fiancee   Methods Explanation;Demonstration;Handout    Comprehension Returned demonstration;Verbalized understanding               PT Long Term Goals - 12/15/20 1531      PT LONG TERM GOAL #1   Title Patient will demonstrate she has regained full shoulder ROM and function post operatively compared to baselines.    Time 8    Period Weeks    Status New    Target Date 02/09/21           Breast Clinic Goals - 12/15/20 1531      Patient will be able to verbalize understanding of pertinent lymphedema risk reduction practices relevant to her diagnosis specifically related to skin care.   Time 1    Period Days    Status Achieved      Patient will be able to return demonstrate and/or verbalize understanding of the post-op home exercise program related to regaining shoulder range of motion.   Time 1    Period Days    Status Achieved      Patient will be able to verbalize understanding of the importance of attending the postoperative After Breast Cancer  Class for further lymphedema risk reduction education and therapeutic exercise.   Time 1    Period Days    Status Achieved                 Plan - 12/15/20 1432    Clinical Impression Statement Patient was diagnosed on 11/15/2020 with left grade II invasive ductal carcinoma breast cancer. It measures 5 mm and is located in the upper outer quadrant. It is ER/PR positive and HER2 negative with a Ki67 of 5%. Her multidisciplinary medical team met prior ot her assessments to determine a recommended treatment plan. She is planning to have a left lumpectomy and sentinel node biopsy followed by radiation and anti-estrogen therapy. She will benefit from a post op PT reassessment to determine needs and from L-Dex screens every 3 months to detect subclinical lymphedema.    Stability/Clinical Decision Making Stable/Uncomplicated    Clinical Decision Making Low    Rehab Potential Excellent    PT Frequency --   Eval and 1 f/u visit   PT Treatment/Interventions ADLs/Self Care Home Management;Therapeutic exercise;Patient/family education    PT Next Visit Plan Will reassess 3-4 weeks post op to determine needs    PT Home Exercise Plan Post op shoulder  ROM HEP    Consulted and Agree with Plan of Care Patient           Patient will benefit from skilled therapeutic intervention in order to improve the following deficits and impairments:  Postural dysfunction,Decreased range of motion,Decreased knowledge of precautions,Impaired UE functional use,Pain  Visit Diagnosis: Malignant neoplasm of upper-outer quadrant of left breast in female, estrogen receptor positive ([place]) - Plan: PT plan of care cert/re-cert  Abnormal posture - Plan: PT plan of care cert/re-cert   Patient will follow up at outpatient cancer rehab 3-4 weeks following surgery.  If the patient requires physical therapy at that time, a specific plan will be dictated and sent to the referring physician for approval. The patient was educated  today on appropriate basic range of motion exercises to begin post operatively and the importance of attending the After Breast Cancer class following surgery.  Patient was educated today on lymphedema risk reduction practices as it pertains to recommendations that will benefit the patient immediately following surgery.  She verbalized good understanding.      Problem List Patient Active Problem List   Diagnosis Date Noted  . Malignant neoplasm of upper-outer quadrant of left breast in female, estrogen receptor positive ([place]) 12/03/2020  . Appendicitis 05/10/2018  . Situational anxiety 04/05/2018  . Vitamin D deficiency 04/05/2018  . Routine physical examination 12/27/2016  . Menopausal symptoms 03/24/2015  . Hyperlipidemia 03/24/2015  . Seborrheic keratoses 03/24/2015  . Weight gain 09/15/2014  . Elevated BP 09/15/2014   Annia Friendly, PT 12/15/20 3:34 PM  [place], [place], [address] Phone: [phone]   Fax:  [phone]  Name: Jamiria Langill MRN: [id number] Date of Birth: 22-Oct-1955

## 2020-12-15 NOTE — Patient Instructions (Signed)

## 2020-12-15 NOTE — Telephone Encounter (Signed)
Spoke with pt and she stated that she doe snot need an appt anymore. She stated that she saw her GYN. She also stated that she was to see the cancer center and have the breast surgery scheduled.

## 2020-12-15 NOTE — Assessment & Plan Note (Signed)
12/03/2020: Screening mammogram showed a left breast mass. Diagnostic mammogram and US showed a 0.5cm mass at the 12 o'clock position. Biopsy showed IDC, grade 2, HER-2 equivocal by IHC, negative by FISH (ratio 1.26), ER+ >95%, PR+ 40%, Ki67 5%.  Pathology and radiology counseling:Discussed with the patient, the details of pathology including the type of breast cancer,the clinical staging, the significance of ER, PR and HER-2/neu receptors and the implications for treatment. After reviewing the pathology in detail, we proceeded to discuss the different treatment options between surgery, radiation, chemotherapy, antiestrogen therapies.  Recommendations: 1. Breast conserving surgery followed by 2. Oncotype DX testing to determine if chemotherapy would be of any benefit followed by 3. Adjuvant radiation therapy followed by 4. Adjuvant antiestrogen therapy  Oncotype counseling: I discussed Oncotype DX test. I explained to the patient that this is a 21 gene panel to evaluate patient tumors DNA to calculate recurrence score. This would help determine whether patient has high risk or low risk breast cancer. She understands that if her tumor was found to be high risk, she would benefit from systemic chemotherapy. If low risk, no need of chemotherapy.  Return to clinic after surgery to discuss final pathology report and then determine if Oncotype DX testing will need to be sent.

## 2020-12-16 ENCOUNTER — Encounter: Payer: Self-pay | Admitting: Genetic Counselor

## 2020-12-16 ENCOUNTER — Other Ambulatory Visit: Payer: Self-pay | Admitting: General Surgery

## 2020-12-16 DIAGNOSIS — Z1379 Encounter for other screening for genetic and chromosomal anomalies: Secondary | ICD-10-CM | POA: Insufficient documentation

## 2020-12-16 DIAGNOSIS — C50412 Malignant neoplasm of upper-outer quadrant of left female breast: Secondary | ICD-10-CM

## 2020-12-16 DIAGNOSIS — Z170 Estrogen receptor positive status [ER+]: Secondary | ICD-10-CM

## 2020-12-16 DIAGNOSIS — Z800 Family history of malignant neoplasm of digestive organs: Secondary | ICD-10-CM

## 2020-12-16 HISTORY — DX: Family history of malignant neoplasm of digestive organs: Z80.0

## 2020-12-17 ENCOUNTER — Telehealth: Payer: Self-pay | Admitting: Hematology and Oncology

## 2020-12-17 ENCOUNTER — Other Ambulatory Visit: Payer: Self-pay

## 2020-12-17 ENCOUNTER — Encounter (HOSPITAL_BASED_OUTPATIENT_CLINIC_OR_DEPARTMENT_OTHER): Payer: Self-pay | Admitting: General Surgery

## 2020-12-17 NOTE — Telephone Encounter (Signed)
Scheduled appointment per 03/03 schedule message. Contacted patient, patient is aware. 

## 2020-12-20 ENCOUNTER — Other Ambulatory Visit: Admission: RE | Admit: 2020-12-20 | Payer: BC Managed Care – PPO | Source: Ambulatory Visit

## 2020-12-21 ENCOUNTER — Telehealth: Payer: Self-pay | Admitting: *Deleted

## 2020-12-21 ENCOUNTER — Encounter: Payer: Self-pay | Admitting: *Deleted

## 2020-12-21 NOTE — Telephone Encounter (Signed)
Spoke with patient to follow up from [facility] and assess navigation needs. Patient denies any questions or concerns at this time. Encouraged her to call should anything arise.Patient verbalized understanding.  

## 2020-12-22 ENCOUNTER — Ambulatory Visit
Admission: RE | Admit: 2020-12-22 | Discharge: 2020-12-22 | Disposition: A | Discharge status: Home / Self Care | Payer: BC Managed Care – PPO | Source: Ambulatory Visit | Attending: General Surgery | Admitting: General Surgery

## 2020-12-22 ENCOUNTER — Other Ambulatory Visit: Payer: Self-pay

## 2020-12-22 DIAGNOSIS — C50412 Malignant neoplasm of upper-outer quadrant of left female breast: Secondary | ICD-10-CM

## 2020-12-22 DIAGNOSIS — R928 Other abnormal and inconclusive findings on diagnostic imaging of breast: Secondary | ICD-10-CM | POA: Diagnosis not present

## 2020-12-22 MED ORDER — ENSURE PRE-SURGERY PO LIQD: 296.0000 mL | Freq: Once | ORAL | Status: DC

## 2020-12-22 NOTE — Progress Notes (Signed)

## 2020-12-23 ENCOUNTER — Ambulatory Visit (HOSPITAL_BASED_OUTPATIENT_CLINIC_OR_DEPARTMENT_OTHER): Payer: BC Managed Care – PPO | Admitting: Anesthesiology

## 2020-12-23 ENCOUNTER — Encounter (HOSPITAL_BASED_OUTPATIENT_CLINIC_OR_DEPARTMENT_OTHER)
Admission: RE | Disposition: A | Discharge status: Home / Self Care | Payer: Self-pay | Source: Home / Self Care | Attending: General Surgery

## 2020-12-23 ENCOUNTER — Encounter (HOSPITAL_BASED_OUTPATIENT_CLINIC_OR_DEPARTMENT_OTHER): Payer: Self-pay | Admitting: General Surgery

## 2020-12-23 ENCOUNTER — Ambulatory Visit (HOSPITAL_BASED_OUTPATIENT_CLINIC_OR_DEPARTMENT_OTHER)
Admission: RE | Admit: 2020-12-23 | Discharge: 2020-12-23 | Disposition: A | Discharge status: Home / Self Care | Payer: BC Managed Care – PPO | Attending: General Surgery | Admitting: General Surgery

## 2020-12-23 ENCOUNTER — Ambulatory Visit
Admission: RE | Admit: 2020-12-23 | Discharge: 2020-12-23 | Disposition: A | Discharge status: Home / Self Care | Payer: BC Managed Care – PPO | Source: Ambulatory Visit | Attending: General Surgery | Admitting: General Surgery

## 2020-12-23 ENCOUNTER — Other Ambulatory Visit: Payer: Self-pay

## 2020-12-23 ENCOUNTER — Ambulatory Visit (HOSPITAL_COMMUNITY)
Admission: RE | Admit: 2020-12-23 | Discharge: 2020-12-23 | Disposition: A | Discharge status: Home / Self Care | Payer: BC Managed Care – PPO | Source: Ambulatory Visit | Attending: General Surgery | Admitting: General Surgery

## 2020-12-23 DIAGNOSIS — C50412 Malignant neoplasm of upper-outer quadrant of left female breast: Secondary | ICD-10-CM | POA: Diagnosis not present

## 2020-12-23 DIAGNOSIS — C50912 Malignant neoplasm of unspecified site of left female breast: Secondary | ICD-10-CM | POA: Diagnosis not present

## 2020-12-23 DIAGNOSIS — R928 Other abnormal and inconclusive findings on diagnostic imaging of breast: Secondary | ICD-10-CM | POA: Diagnosis not present

## 2020-12-23 DIAGNOSIS — Z170 Estrogen receptor positive status [ER+]: Secondary | ICD-10-CM

## 2020-12-23 DIAGNOSIS — G8918 Other acute postprocedural pain: Secondary | ICD-10-CM | POA: Diagnosis not present

## 2020-12-23 DIAGNOSIS — E559 Vitamin D deficiency, unspecified: Secondary | ICD-10-CM | POA: Diagnosis not present

## 2020-12-23 HISTORY — PX: BREAST LUMPECTOMY WITH RADIOACTIVE SEED AND AXILLARY LYMPH NODE DISSECTION: SHX6656

## 2020-12-23 HISTORY — DX: Anxiety disorder, unspecified: F41.9

## 2020-12-23 SURGERY — BREAST LUMPECTOMY WITH RADIOACTIVE SEED AND AXILLARY LYMPH NODE DISSECTION
Anesthesia: Regional | Site: Breast | Laterality: Left

## 2020-12-23 MED ORDER — MIDAZOLAM HCL 2 MG/2ML IJ SOLN
INTRAMUSCULAR | Status: AC
  Filled 2020-12-23: qty 2

## 2020-12-23 MED ORDER — MIDAZOLAM HCL 2 MG/2ML IJ SOLN
2.0000 mg | Freq: Once | INTRAMUSCULAR | Status: AC
  Administered 2020-12-23: 2 mg via INTRAVENOUS

## 2020-12-23 MED ORDER — TRAMADOL HCL 50 MG PO TABS: 50.0000 mg | ORAL_TABLET | Freq: Once | ORAL | Status: DC

## 2020-12-23 MED ORDER — ONDANSETRON HCL 4 MG/2ML IJ SOLN
INTRAMUSCULAR | Status: DC | PRN
  Administered 2020-12-23: 4 mg via INTRAVENOUS

## 2020-12-23 MED ORDER — BUPIVACAINE HCL (PF) 0.5 % IJ SOLN
INTRAMUSCULAR | Status: DC | PRN
  Administered 2020-12-23: 20 mL via PERINEURAL

## 2020-12-23 MED ORDER — BUPIVACAINE LIPOSOME 1.3 % IJ SUSP
INTRAMUSCULAR | Status: DC | PRN
  Administered 2020-12-23: 10 mL via PERINEURAL

## 2020-12-23 MED ORDER — KETOROLAC TROMETHAMINE 15 MG/ML IJ SOLN
INTRAMUSCULAR | Status: AC
  Filled 2020-12-23: qty 1

## 2020-12-23 MED ORDER — CEFAZOLIN SODIUM-DEXTROSE 2-4 GM/100ML-% IV SOLN
INTRAVENOUS | Status: AC
  Filled 2020-12-23: qty 100

## 2020-12-23 MED ORDER — PROPOFOL 10 MG/ML IV BOLUS
INTRAVENOUS | Status: AC
  Filled 2020-12-23: qty 40

## 2020-12-23 MED ORDER — EPHEDRINE 5 MG/ML INJ
INTRAVENOUS | Status: AC
  Filled 2020-12-23: qty 10

## 2020-12-23 MED ORDER — MEPERIDINE HCL 25 MG/ML IJ SOLN: 6.2500 mg | INTRAMUSCULAR | Status: DC | PRN

## 2020-12-23 MED ORDER — CEFAZOLIN SODIUM-DEXTROSE 2-4 GM/100ML-% IV SOLN
2.0000 g | INTRAVENOUS | Status: AC
  Administered 2020-12-23: 2 g via INTRAVENOUS

## 2020-12-23 MED ORDER — TRAMADOL HCL 50 MG PO TABS: 50.0000 mg | ORAL_TABLET | Freq: Four times a day (QID) | ORAL | 0 refills | Status: DC | PRN

## 2020-12-23 MED ORDER — BUPIVACAINE HCL (PF) 0.25 % IJ SOLN
INTRAMUSCULAR | Status: DC | PRN
  Administered 2020-12-23: 7 mL

## 2020-12-23 MED ORDER — LACTATED RINGERS IV SOLN: INTRAVENOUS | Status: DC

## 2020-12-23 MED ORDER — KETOROLAC TROMETHAMINE 30 MG/ML IJ SOLN: 30.0000 mg | Freq: Once | INTRAMUSCULAR | Status: DC | PRN

## 2020-12-23 MED ORDER — PHENYLEPHRINE 40 MCG/ML (10ML) SYRINGE FOR IV PUSH (FOR BLOOD PRESSURE SUPPORT)
PREFILLED_SYRINGE | INTRAVENOUS | Status: AC
  Filled 2020-12-23: qty 30

## 2020-12-23 MED ORDER — KETOROLAC TROMETHAMINE 15 MG/ML IJ SOLN
15.0000 mg | INTRAMUSCULAR | Status: AC
  Administered 2020-12-23: 15 mg via INTRAVENOUS

## 2020-12-23 MED ORDER — TECHNETIUM TC 99M TILMANOCEPT KIT
1.0000 | PACK | Freq: Once | INTRAVENOUS | Status: AC | PRN
  Administered 2020-12-23: 1 via INTRADERMAL

## 2020-12-23 MED ORDER — LIDOCAINE 2% (20 MG/ML) 5 ML SYRINGE
INTRAMUSCULAR | Status: DC | PRN
  Administered 2020-12-23: 40 mg via INTRAVENOUS

## 2020-12-23 MED ORDER — PHENYLEPHRINE HCL (PRESSORS) 10 MG/ML IV SOLN
INTRAVENOUS | Status: DC | PRN
  Administered 2020-12-23 (×5): 80 ug via INTRAVENOUS

## 2020-12-23 MED ORDER — DEXAMETHASONE SODIUM PHOSPHATE 4 MG/ML IJ SOLN
INTRAMUSCULAR | Status: DC | PRN
  Administered 2020-12-23: 8 mg via INTRAVENOUS

## 2020-12-23 MED ORDER — FENTANYL CITRATE (PF) 100 MCG/2ML IJ SOLN
INTRAMUSCULAR | Status: AC
  Filled 2020-12-23: qty 2

## 2020-12-23 MED ORDER — ACETAMINOPHEN 500 MG PO TABS
1000.0000 mg | ORAL_TABLET | ORAL | Status: AC
  Administered 2020-12-23: 1000 mg via ORAL

## 2020-12-23 MED ORDER — EPHEDRINE SULFATE 50 MG/ML IJ SOLN
INTRAMUSCULAR | Status: DC | PRN
  Administered 2020-12-23 (×2): 10 mg via INTRAVENOUS

## 2020-12-23 MED ORDER — FENTANYL CITRATE (PF) 100 MCG/2ML IJ SOLN
100.0000 ug | Freq: Once | INTRAMUSCULAR | Status: AC
Start: 2020-12-23 — End: 2020-12-23
  Administered 2020-12-23: 100 ug via INTRAVENOUS

## 2020-12-23 MED ORDER — ACETAMINOPHEN 500 MG PO TABS
ORAL_TABLET | ORAL | Status: AC
  Filled 2020-12-23: qty 2

## 2020-12-23 MED ORDER — ONDANSETRON HCL 4 MG/2ML IJ SOLN
INTRAMUSCULAR | Status: AC
  Filled 2020-12-23: qty 2

## 2020-12-23 MED ORDER — HYDROMORPHONE HCL 1 MG/ML IJ SOLN: 0.2500 mg | INTRAMUSCULAR | Status: DC | PRN

## 2020-12-23 MED ORDER — OXYCODONE HCL 5 MG/5ML PO SOLN
5.0000 mg | Freq: Once | ORAL | Status: DC | PRN
Start: 2020-12-23 — End: 2020-12-23

## 2020-12-23 MED ORDER — PROPOFOL 10 MG/ML IV BOLUS
INTRAVENOUS | Status: DC | PRN
  Administered 2020-12-23: 25 ug/kg/min via INTRAVENOUS
  Administered 2020-12-23: 200 mg via INTRAVENOUS

## 2020-12-23 MED ORDER — SCOPOLAMINE 1 MG/3DAYS TD PT72
1.0000 | MEDICATED_PATCH | TRANSDERMAL | Status: DC
  Administered 2020-12-23: 1.5 mg via TRANSDERMAL

## 2020-12-23 MED ORDER — PROMETHAZINE HCL 25 MG/ML IJ SOLN: 6.2500 mg | INTRAMUSCULAR | Status: DC | PRN

## 2020-12-23 MED ORDER — OXYCODONE HCL 5 MG PO TABS
5.0000 mg | ORAL_TABLET | Freq: Once | ORAL | Status: DC | PRN
Start: 2020-12-23 — End: 2020-12-23

## 2020-12-23 MED ORDER — PHENYLEPHRINE 40 MCG/ML (10ML) SYRINGE FOR IV PUSH (FOR BLOOD PRESSURE SUPPORT)
PREFILLED_SYRINGE | INTRAVENOUS | Status: AC
  Filled 2020-12-23: qty 10

## 2020-12-23 MED ORDER — SCOPOLAMINE 1 MG/3DAYS TD PT72
MEDICATED_PATCH | TRANSDERMAL | Status: AC
  Filled 2020-12-23: qty 1

## 2020-12-23 SURGICAL SUPPLY — 57 items
ADH SKN CLS APL DERMABOND .7 (GAUZE/BANDAGES/DRESSINGS) ×1
APL PRP STRL LF DISP 70% ISPRP (MISCELLANEOUS) ×1
APPLIER CLIP 9.375 MED OPEN (MISCELLANEOUS) ×2
APR CLP MED 9.3 20 MLT OPN (MISCELLANEOUS) ×1
BINDER BREAST LRG (GAUZE/BANDAGES/DRESSINGS) IMPLANT
BINDER BREAST MEDIUM (GAUZE/BANDAGES/DRESSINGS) IMPLANT
BINDER BREAST XLRG (GAUZE/BANDAGES/DRESSINGS) ×2 IMPLANT
BINDER BREAST XXLRG (GAUZE/BANDAGES/DRESSINGS) IMPLANT
BLADE SURG 15 STRL LF DISP TIS (BLADE) ×1 IMPLANT
BLADE SURG 15 STRL SS (BLADE) ×2
CANISTER SUC SOCK COL 7IN (MISCELLANEOUS) IMPLANT
CANISTER SUCT 1200ML W/VALVE (MISCELLANEOUS) ×2 IMPLANT
CHLORAPREP W/TINT 26 (MISCELLANEOUS) ×2 IMPLANT
CLIP APPLIE 9.375 MED OPEN (MISCELLANEOUS) ×1 IMPLANT
CLIP VESOCCLUDE SM WIDE 6/CT (CLIP) IMPLANT
COVER BACK TABLE 60X90IN (DRAPES) ×2 IMPLANT
COVER MAYO STAND STRL (DRAPES) ×2 IMPLANT
COVER PROBE W GEL 5X96 (DRAPES) ×2 IMPLANT
COVER WAND RF STERILE (DRAPES) IMPLANT
DECANTER SPIKE VIAL GLASS SM (MISCELLANEOUS) IMPLANT
DERMABOND ADVANCED (GAUZE/BANDAGES/DRESSINGS) ×1
DERMABOND ADVANCED .7 DNX12 (GAUZE/BANDAGES/DRESSINGS) ×1 IMPLANT
DRAPE LAPAROSCOPIC ABDOMINAL (DRAPES) ×2 IMPLANT
DRAPE UTILITY XL STRL (DRAPES) ×2 IMPLANT
ELECT COATED BLADE 2.86 ST (ELECTRODE) ×2 IMPLANT
ELECT REM PT RETURN 9FT ADLT (ELECTROSURGICAL) ×2
ELECTRODE REM PT RTRN 9FT ADLT (ELECTROSURGICAL) ×1 IMPLANT
GLOVE SURG ENC MOIS LTX SZ7 (GLOVE) ×4 IMPLANT
GLOVE SURG UNDER POLY LF SZ7.5 (GLOVE) ×2 IMPLANT
GOWN STRL REUS W/ TWL LRG LVL3 (GOWN DISPOSABLE) ×2 IMPLANT
GOWN STRL REUS W/TWL LRG LVL3 (GOWN DISPOSABLE) ×4
HEMOSTAT ARISTA ABSORB 3G PWDR (HEMOSTASIS) IMPLANT
KIT MARKER MARGIN INK (KITS) ×2 IMPLANT
NDL SAFETY ECLIPSE 18X1.5 (NEEDLE) IMPLANT
NEEDLE HYPO 18GX1.5 SHARP (NEEDLE)
NEEDLE HYPO 25X1 1.5 SAFETY (NEEDLE) ×2 IMPLANT
NS IRRIG 1000ML POUR BTL (IV SOLUTION) ×2 IMPLANT
PACK BASIN DAY SURGERY FS (CUSTOM PROCEDURE TRAY) ×2 IMPLANT
PENCIL SMOKE EVACUATOR (MISCELLANEOUS) ×2 IMPLANT
RETRACTOR ONETRAX LX 90X20 (MISCELLANEOUS) ×2 IMPLANT
SLEEVE SCD COMPRESS KNEE MED (STOCKING) ×2 IMPLANT
SPONGE LAP 4X18 RFD (DISPOSABLE) ×4 IMPLANT
STRIP CLOSURE SKIN 1/2X4 (GAUZE/BANDAGES/DRESSINGS) ×2 IMPLANT
SUT ETHILON 2 0 FS 18 (SUTURE) IMPLANT
SUT MNCRL AB 4-0 PS2 18 (SUTURE) ×2 IMPLANT
SUT MON AB 5-0 PS2 18 (SUTURE) ×2 IMPLANT
SUT SILK 2 0 SH (SUTURE) ×2 IMPLANT
SUT VIC AB 2-0 SH 27 (SUTURE) ×6
SUT VIC AB 2-0 SH 27XBRD (SUTURE) ×3 IMPLANT
SUT VIC AB 3-0 SH 27 (SUTURE) ×4
SUT VIC AB 3-0 SH 27X BRD (SUTURE) ×2 IMPLANT
SUT VIC AB 5-0 PS2 18 (SUTURE) IMPLANT
SYR CONTROL 10ML LL (SYRINGE) ×2 IMPLANT
TOWEL GREEN STERILE FF (TOWEL DISPOSABLE) ×2 IMPLANT
TRAY FAXITRON CT DISP (TRAY / TRAY PROCEDURE) ×2 IMPLANT
TUBE CONNECTING 20X1/4 (TUBING) ×2 IMPLANT
YANKAUER SUCT BULB TIP NO VENT (SUCTIONS) ×2 IMPLANT

## 2020-12-23 NOTE — Op Note (Signed)
Preoperative diagnosis: clinical stage I leftbreast cancer Postoperative diagnosis: Same as above Procedure: 1. left breast radioactive seed guided lumpectomy 2. Left deep axillary sentinel lymph node biopsy Surgeon: Dr. Serita Grammes Anesthesia: General with a pectoral block Estimated blood loss: 20 cc Specimens: 1.  Left breast tissue marked with paint containing seed and clip 2.  Additional anterior and superior margins marked short superior, long lateral, double deep 3.  Left deep axillary sentinel lymph nodes with highest count of 39 Complications: None Drains: None Sponge and count was correct at completion Decision to recovery stable condition  Indications: 15 yof presents to [place] with screening detected left breast mass. on [name] it is a 5x4x3 mm mass. axillary ultrasound is negative. biopsy shows a grade II IDC that is >95% er pos, pr pos at 40%, her 2 negative and Ki is 5%. we elected to proceed with lumpectomy/sn biopsy  Procedure: After informed consent was obtained the patient first underwent radioactive seed placement.  I had these mammograms available to me.  She was given antibiotics.  SCDs were in place.  She underwent a pectoral block with anesthesia as well as an injection of Lymphoseek in the standard periareolar fashion.  She was then taken to the operating room and placed under general anesthesia without complication.  She was prepped and draped in the standard sterile surgical fashion.  A surgical timeout was then performed.  I identified the seed in the central superior breast. I then infiltrated marcaine and made a periareolar incision to hide the scar later. I tunneled to the seed with assistance of the lighted retractor. I then removed the seed and the surrounding tissue to get a clear margin.  Mammogram confirmed the removal of the seed and the clip.  On the 3D imaging I thought my anterior and superior margins might be close so I did excise these and marked them  as above.  The posterior margin is now the muscle.  I placed clips in this cavity.  I closed the cavity with 2-0 vicryl and the skin with 3-0 vicryl and 5-0 monocryl. Glue and steristrips were applied.    I then made an incision in the low axilla and carried this through the fascia. There was diffuse activity in the axilla.  I was able to identify two nodes that had counts above.  There still was some low level diffuse activity but no other hot spots or nodes that I could identify.   Hemostasis was obtained.  I closed the axilla with 2-0 Vicryl suture.  The skin was closed with 3-0 Vicryl and 4-0 Monocryl.  Glue and Steri-Strips were applied.  She tolerated this well was extubated and transferred to recovery stable.

## 2020-12-23 NOTE — Progress Notes (Signed)
Assisted Dr. Doroteo Glassman with left, ultrasound guided, pectoralis block. Side rails up, monitors on throughout procedure. See vital signs in flow sheet. Tolerated Procedure well.

## 2020-12-23 NOTE — H&P (Signed)
65 yof presents to [facility] with screening detected left breast mass. on [name] it is a 5x4x3 mm mass. axillary ultrasound is negative. biopsy shows a grade II IDC that is >95% er pos, pr pos at 40%, her 2 negative and Ki is 5%. she has no mass or dc. she has no prior history. 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   Past Surgical History Conni Slipper, RN; 12/15/2020 7:53 AM) Appendectomy  Breast Biopsy  Left. Cesarean Section - 1   Diagnostic Studies History Conni Slipper, RN; 12/15/2020 7:53 AM) Colonoscopy  5-10 years ago Mammogram  within last year Pap Smear  1-5 years ago  Social History Conni Slipper, RN; 12/15/2020 7:53 AM) Alcohol use  Occasional alcohol use, Recently quit alcohol use. Caffeine use  Coffee. No alcohol use  No caffeine use  No drug use  Tobacco use  Never smoker.  Family History Conni Slipper, RN; 12/15/2020 7:53 AM) Heart Disease  Father. Hypertension  Father. Malignant Neoplasm Of Pancreas  Mother. Thyroid problems  Mother.  Pregnancy / Birth History Conni Slipper, RN; 12/15/2020 7:53 AM) Age at menarche  62 years, 14 years. Age of menopause  3-55 Gravida  2 3 Length (months) of breastfeeding  3-6 Maternal age  63-30 36-25 Para  2 Regular periods   Other Problems Conni Slipper, RN; 12/15/2020 7:53 AM) Kidney Joaquim Lai  Lump In Breast   Review of Systems Conni Slipper RN; 12/15/2020 7:53 AM) General Not Present- Appetite Loss, Chills, Fatigue, Fever, Night Sweats, Weight Gain and Weight Loss. Skin Not Present- Change in Wart/Mole, Dryness, Hives, Jaundice, New Lesions, Non-Healing Wounds, Rash and Ulcer. HEENT Present- Wears glasses/contact lenses. Not Present- Earache, Hearing Loss, Hoarseness, Nose Bleed, Oral Ulcers, Ringing in the Ears, Seasonal Allergies, Sinus Pain, Sore Throat, Visual Disturbances and Yellow Eyes. Respiratory Not Present- Bloody sputum, Chronic Cough, Difficulty Breathing, Snoring and  Wheezing. Breast Present- Breast Mass. Not Present- Breast Pain, Nipple Discharge and Skin Changes. Cardiovascular Not Present- Chest Pain, Difficulty Breathing Lying Down, Leg Cramps, Palpitations, Rapid Heart Rate, Shortness of Breath and Swelling of Extremities. Gastrointestinal Not Present- Abdominal Pain, Bloating, Bloody Stool, Change in Bowel Habits, Chronic diarrhea, Constipation, Difficulty Swallowing, Excessive gas, Gets full quickly at meals, Hemorrhoids, Indigestion, Nausea, Rectal Pain and Vomiting. Female Genitourinary Not Present- Frequency, Nocturia, Painful Urination, Pelvic Pain and Urgency. Musculoskeletal Not Present- Back Pain, Joint Pain, Joint Stiffness, Muscle Pain, Muscle Weakness and Swelling of Extremities. Neurological Not Present- Decreased Memory, Fainting, Headaches, Numbness, Seizures, Tingling, Tremor, Trouble walking and Weakness. Psychiatric Not Present- Anxiety, Bipolar, Change in Sleep Pattern, Depression, Fearful and Frequent crying. Endocrine Not Present- Cold Intolerance, Excessive Hunger, Hair Changes, Heat Intolerance, Hot flashes and New Diabetes. Hematology Not Present- Blood Thinners, Easy Bruising, Excessive bleeding, Gland problems, HIV and Persistent Infections.   Physical Exam Rolm Bookbinder MD; 12/15/2020 10:29 AM) General Mental Status-Alert. Orientation-Oriented X3. Breast Nipples-No Discharge. Breast Lump-No Palpable Breast Mass. Lymphatic Head & Neck General Head & Neck Lymphatics: Bilateral - Description - Normal. Axillary General Axillary Region: Bilateral - Description - Normal. Note: no [place] adenopathy   Assessment & Plan Rolm Bookbinder MD; 12/15/2020 10:29 AM) BREAST CANCER OF UPPER-OUTER QUADRANT OF LEFT FEMALE BREAST (C50.412) Story: Left breast seed guided lumpectomy, left axillary sn biopsy We discussed the staging and pathophysiology of breast cancer. We discussed all of the different options for treatment for  breast cancer including surgery, chemotherapy, radiation therapy, Herceptin, and antiestrogen therapy. We discussed a sentinel lymph node biopsy  as she does not appear to having lymph node involvement right now. We discussed the performance of that with injection of radioactive tracer. We discussed that there is a chance of having a positive node with a sentinel lymph node biopsy and we will await the permanent pathology to make any other first further decisions in terms of her treatment. We discussed up to a 5% risk lifetime of chronic shoulder pain as well as lymphedema associated with a sentinel lymph node biopsy. We discussed the options for treatment of the breast cancer which included lumpectomy versus a mastectomy. We discussed the performance of the lumpectomy with radioactive seed placement. We discussed a 5-10% chance of a positive margin requiring reexcision in the operating room. We also discussed that she will need radiation therapy if she undergoes lumpectomy. We discussed mastectomy and the postoperative care for that as well. Mastectomy can be followed by reconstruction. The decision for lumpectomy vs mastectomy has no impact on decision for chemotherapy. Most mastectomy patients will not need radiation therapy. We discussed that there is no difference in her survival whether she undergoes lumpectomy with radiation therapy or antiestrogen therapy versus a mastectomy. There is also no real difference between her recurrence in the breast. We discussed the risks of operation including bleeding, infection, possible reoperation. She understands her further therapy will be based on what her stages at the time of her operation.

## 2020-12-23 NOTE — Discharge Instructions (Signed)
Post Anesthesia Home Care Instructions  Activity: Get plenty of rest for the remainder of the day. A responsible individual must stay with you for 24 hours following the procedure.  For the next 24 hours, DO NOT: -Drive a car -Paediatric nurse -Drink alcoholic beverages -Take any medication unless instructed by your physician -Make any legal decisions or sign important papers.  Meals: Start with liquid foods such as gelatin or soup. Progress to regular foods as tolerated. Avoid greasy, spicy, heavy foods. If nausea and/or vomiting occur, drink only clear liquids until the nausea and/or vomiting subsides. Call your physician if vomiting continues.  Special Instructions/Symptoms: Your throat may feel dry or sore from the anesthesia or the breathing tube placed in your throat during surgery. If this causes discomfort, gargle with warm salt water. The discomfort should disappear within 24 hours.  If you had a scopolamine patch placed behind your ear for the management of post- operative nausea and/or vomiting:  1. The medication in the patch is effective for 72 hours, after which it should be removed.  Wrap patch in a tissue and discard in the trash. Wash hands thoroughly with soap and water. 2. You may remove the patch earlier than 72 hours if you experience unpleasant side effects which may include dry mouth, dizziness or visual disturbances. 3. Avoid touching the patch. Wash your hands with soap and water after contact with the patch.        Information for Discharge Teaching: EXPAREL (bupivacaine liposome injectable suspension)   Your surgeon or anesthesiologist gave you EXPAREL(bupivacaine) to help control your pain after surgery.   EXPAREL is a local anesthetic that provides pain relief by numbing the tissue around the surgical site.  EXPAREL is designed to release pain medication over time and can control pain for up to 72 hours.  Depending on how you respond to EXPAREL, you  may require less pain medication during your recovery.  Possible side effects:  Temporary loss of sensation or ability to move in the area where bupivacaine was injected.  Nausea, vomiting, constipation  Rarely, numbness and tingling in your mouth or lips, lightheadedness, or anxiety may occur.  Call your doctor right away if you think you may be experiencing any of these sensations, or if you have other questions regarding possible side effects.  Follow all other discharge instructions given to you by your surgeon or nurse. Eat a healthy diet and drink plenty of water or other fluids.  If you return to the hospital for any reason within 96 hours following the administration of EXPAREL, it is important for health care providers to know that you have received this anesthetic. A teal colored band has been placed on your arm with the date, time and amount of EXPAREL you have received in order to alert and inform your health care providers. Please leave this armband in place for the full 96 hours following administration, and then you may remove the band.   May give next dose of Tylenol at [place] Phone Number [phone]  BREAST BIOPSY/ PARTIAL MASTECTOMY: POST OP INSTRUCTIONS Take 400 mg of ibuprofen every 8 hours or 650 mg tylenol every 6 hours for next 72 hours then as needed. Use ice several times daily also. Always review your discharge instruction sheet given to you by the facility where your surgery was performed.  IF YOU HAVE DISABILITY OR FAMILY LEAVE FORMS, YOU MUST BRING THEM TO THE OFFICE FOR PROCESSING.  DO NOT GIVE  THEM TO YOUR DOCTOR.  1. A prescription for pain medication may be given to you upon discharge.  Take your pain medication as prescribed, if needed.  If narcotic pain medicine is not needed, then you may take acetaminophen (Tylenol), naprosyn (Alleve) or ibuprofen (Advil) as needed. 2. Take your usually prescribed medications unless  otherwise directed 3. If you need a refill on your pain medication, please contact your pharmacy.  They will contact our office to request authorization.  Prescriptions will not be filled after 5pm or on week-ends. 4. You should eat very light the first 24 hours after surgery, such as soup, crackers, pudding, etc.  Resume your normal diet the day after surgery. 5. Most patients will experience some swelling and bruising in the breast.  Ice packs and a good support bra will help.  Wear the breast binder provided or a sports bra for 72 hours day and night.  After that wear a sports bra during the day until you return to the office. Swelling and bruising can take several days to resolve.  6. It is common to experience some constipation if taking pain medication after surgery.  Increasing fluid intake and taking a stool softener will usually help or prevent this problem from occurring.  A mild laxative (Milk of Magnesia or Miralax) should be taken according to package directions if there are no bowel movements after 48 hours. 7. Unless discharge instructions indicate otherwise, you may remove your bandages 48 hours after surgery and you may shower at that time.  You may have steri-strips (small skin tapes) in place directly over the incision.  These strips should be left on the skin for 7-10 days and will come off on their own.  If your surgeon used skin glue on the incision, you may shower in 24 hours.  The glue will flake off over the next 2-3 weeks.  Any sutures or staples will be removed at the office during your follow-up visit. 8. ACTIVITIES:  You may resume regular daily activities (gradually increasing) beginning the next day.  Wearing a good support bra or sports bra minimizes pain and swelling.  You may have sexual intercourse when it is comfortable. a. You may drive when you no longer are taking prescription pain medication, you can comfortably wear a seatbelt, and you can safely maneuver your car and  apply brakes. b. RETURN TO WORK:  ______________________________________________________________________________________ 9. You should see your doctor in the office for a follow-up appointment approximately two weeks after your surgery.  Your doctor's nurse will typically make your follow-up appointment when she calls you with your pathology report.  Expect your pathology report 3-4 business days after your surgery.  You may call to check if you do not hear from [name] after three days. 10. OTHER INSTRUCTIONS: _______________________________________________________________________________________________ _____________________________________________________________________________________________________________________________________ _____________________________________________________________________________________________________________________________________ _____________________________________________________________________________________________________________________________________  WHEN TO CALL DR WAKEFIELD: 1. Fever over 101.0 2. Nausea and/or vomiting. 3. Extreme swelling or bruising. 4. Continued bleeding from incision. 5. Increased pain, redness, or drainage from the incision.  The clinic staff is available to answer your questions during regular business hours.  Please don't hesitate to call and ask to speak to one of the nurses for clinical concerns.  If you have a medical emergency, go to the nearest emergency room or call 911.  A surgeon from [facility] is always on call at the hospital.  For further questions, please visit [email or web address]

## 2020-12-23 NOTE — Interval H&P Note (Signed)
History and Physical Interval Note:  12/23/2020 9:49 AM  Lisa Gallegos  has presented today for surgery, with the diagnosis of LEFT BREAST CANCER.  The various methods of treatment have been discussed with the patient and family. After consideration of risks, benefits and other options for treatment, the patient has consented to  Procedure(s) with comments: LEFT BREAST LUMPECTOMY WITH RADIOACTIVE SEED AND LEFT AXILLARY LYMPH NODE BIOPSY (Left) - PEC BLOCK; START TIME OF 11:30 AM FOR 60 MINUTES ROOM 2 [name] IQ as a surgical intervention.  The patient's history has been reviewed, patient examined, no change in status, stable for surgery.  I have reviewed the patient's chart and labs.  Questions were answered to the patient's satisfaction.     Rolm Bookbinder

## 2020-12-23 NOTE — Anesthesia Postprocedure Evaluation (Signed)
Anesthesia Post Note  Patient: Lisa Gallegos  Procedure(s) Performed: LEFT BREAST LUMPECTOMY WITH RADIOACTIVE SEED AND LEFT AXILLARY LYMPH NODE BIOPSY (Left Breast)     Patient location during evaluation: PACU Anesthesia Type: Regional and General Level of consciousness: awake and alert, oriented and patient cooperative Pain management: pain level controlled Vital Signs Assessment: post-procedure vital signs reviewed and stable Respiratory status: spontaneous breathing, nonlabored ventilation and respiratory function stable Cardiovascular status: blood pressure returned to baseline and stable Postop Assessment: no apparent nausea or vomiting Anesthetic complications: no   No complications documented.  Last Vitals:  Vitals:   12/23/20 1200 12/23/20 1215  BP: (!) 114/59 115/73  Pulse: 67 71  Resp: (!) 9 11  Temp: 36.9 C   SpO2: 99% 92%    Last Pain:  Vitals:   12/23/20 1215  TempSrc:   PainSc: 0-No pain                 Pervis Hocking

## 2020-12-23 NOTE — Anesthesia Procedure Notes (Signed)
Procedure Name: LMA Insertion Date/Time: 12/23/2020 10:06 AM Performed by: Ezequiel Kayser, CRNA Pre-anesthesia Checklist: Patient identified, Emergency Drugs available, Suction available and Patient being monitored Patient Re-evaluated:Patient Re-evaluated prior to induction Oxygen Delivery Method: Circle System Utilized Preoxygenation: Pre-oxygenation with 100% oxygen Induction Type: IV induction Ventilation: Mask ventilation without difficulty LMA: LMA inserted LMA Size: 4.0 Number of attempts: 1 Airway Equipment and Method: Bite block Placement Confirmation: positive ETCO2 Tube secured with: Tape Dental Injury: Teeth and Oropharynx as per pre-operative assessment  Comments: Eyes taped prior to insertion

## 2020-12-23 NOTE — Anesthesia Procedure Notes (Signed)
Anesthesia Regional Block: Pectoralis block   Pre-Anesthetic Checklist: ,, timeout performed, Correct Patient, Correct Site, Correct Laterality, Correct Procedure, Correct Position, site marked, Risks and benefits discussed,  Surgical consent,  Pre-op evaluation,  At surgeon's request and post-op pain management  Laterality: Left  Prep: Maximum Sterile Barrier Precautions used, chloraprep       Needles:  Injection technique: Single-shot  Needle Type: Echogenic Stimulator Needle     Needle Length: 9cm  Needle Gauge: 22     Additional Needles:   Procedures:,,,, ultrasound used (permanent image in chart),,,,  Narrative:  Start time: 12/23/2020 10:18 AM End time: 12/23/2020 10:23 AM Injection made incrementally with aspirations every 5 mL.  Performed by: Personally  Anesthesiologist: Pervis Hocking, DO  Additional Notes: Monitors applied. No increased pain on injection. No increased resistance to injection. Injection made in 5cc increments. Good needle visualization. Patient tolerated procedure well.

## 2020-12-23 NOTE — Anesthesia Preprocedure Evaluation (Addendum)
Anesthesia Evaluation  Patient identified by MRN, date of birth, ID band Patient awake    Reviewed: Allergy & Precautions, NPO status , Patient's Chart, lab work & pertinent test results  Airway Mallampati: I  TM Distance: >3 FB Neck ROM: Full    Dental no notable dental hx. (+) Teeth Intact, Dental Advisory Given   Pulmonary neg pulmonary ROS,    Pulmonary exam normal breath sounds clear to auscultation       Cardiovascular negative cardio ROS Normal cardiovascular exam Rhythm:Regular Rate:Normal     Neuro/Psych PSYCHIATRIC DISORDERS Anxiety negative neurological ROS     GI/Hepatic negative GI ROS, Neg liver ROS,   Endo/Other  negative endocrine ROS  Renal/GU negative Renal ROS  negative genitourinary   Musculoskeletal negative musculoskeletal ROS (+) hct 42.5   Abdominal   Peds  Hematology negative hematology ROS (+)   Anesthesia Other Findings L breast ca   Reproductive/Obstetrics negative OB ROS                            Anesthesia Physical Anesthesia Plan  ASA: II  Anesthesia Plan: General and Regional   Post-op Pain Management: GA combined w/ Regional for post-op pain   Induction: Intravenous  PONV Risk Score and Plan: Ondansetron, Dexamethasone, Midazolam, Scopolamine patch - Pre-op and Treatment may vary due to age or medical condition  Airway Management Planned: LMA  Additional Equipment: None  Intra-op Plan:   Post-operative Plan: Extubation in OR  Informed Consent: I have reviewed the patients History and Physical, chart, labs and discussed the procedure including the risks, benefits and alternatives for the proposed anesthesia with the patient or authorized representative who has indicated his/her understanding and acceptance.     Dental advisory given  Plan Discussed with: CRNA  Anesthesia Plan Comments: (Prefers tramadol to other stronger opiates)        Anesthesia Quick Evaluation

## 2020-12-23 NOTE — Progress Notes (Signed)
Emotional support provided through nuclear medicine breast injections. Vital signs stable. Patient tolerated well.  

## 2020-12-23 NOTE — Transfer of Care (Signed)
Immediate Anesthesia Transfer of Care Note  Patient: Lisa Gallegos  Procedure(s) Performed: LEFT BREAST LUMPECTOMY WITH RADIOACTIVE SEED AND LEFT AXILLARY LYMPH NODE BIOPSY (Left Breast)  Patient Location: PACU  Anesthesia Type:General and Regional  Level of Consciousness: drowsy  Airway & Oxygen Therapy: Patient Spontanous Breathing and Patient connected to face mask oxygen  Post-op Assessment: Report given to RN and Post -op Vital signs reviewed and stable  Post vital signs: Reviewed and stable  Last Vitals:  Vitals Value Taken Time  BP 114/59 12/23/20 1200  Temp    Pulse 65 12/23/20 1202  Resp 9 12/23/20 1202  SpO2 100 % 12/23/20 1202  Vitals shown include unvalidated device data.  Last Pain:  Vitals:   12/23/20 1015  TempSrc: Oral         Complications: No complications documented.

## 2020-12-24 ENCOUNTER — Encounter (HOSPITAL_BASED_OUTPATIENT_CLINIC_OR_DEPARTMENT_OTHER): Payer: Self-pay | Admitting: General Surgery

## 2020-12-28 ENCOUNTER — Encounter: Payer: Self-pay | Admitting: Dietician

## 2020-12-28 LAB — SURGICAL PATHOLOGY

## 2020-12-28 NOTE — Progress Notes (Signed)
Nutrition  Patient identified after attending Breast Clinic on 12/15/20. Patient given nutrition packet with RD contact information by nurse navigator at that time.  Chart reviewed.   Patient with newly diagnosed malignant neoplasm of upper-outer quadrant of left breast in female, estrogen receptor positive. She underwent left breast lumpectomy with radioactive seed and left axillary lymph node biopsy with Dr. Donne Hazel on 3/10. Awaiting permanent pathology results before further treatment decisions.   She is followed by Dr. Lindi Adie Recommendations: 1. Breast conserving surgery followed by 2. Oncotype DX testing to determine if chemotherapy would be of any benefit followed by 3. Adjuvant radiation therapy followed by 4. Adjuvant antiestrogen therapy  No reported nutrition concerns per review of symptoms completed with patient per 3/10 MD note. Weight 170 lb 6.7 oz on 3/10 slightly decreased from 171 lb 11.2 oz on 3/2. No other recent history for review.   Patient is not currently at nutrition risk. Please consult RD if nutrition issues arise.

## 2020-12-29 ENCOUNTER — Encounter: Payer: Self-pay | Admitting: *Deleted

## 2020-12-29 ENCOUNTER — Telehealth: Payer: Self-pay | Admitting: *Deleted

## 2020-12-29 DIAGNOSIS — C50412 Malignant neoplasm of upper-outer quadrant of left female breast: Secondary | ICD-10-CM

## 2020-12-29 NOTE — Telephone Encounter (Signed)
Per Dr. Lindi Adie, no oncotype Referral placed for pt to see Dr. Lisbeth Renshaw

## 2020-12-30 NOTE — Progress Notes (Signed)
Patient Care Team: Burnard Hawthorne, FNP as PCP - General (Family Medicine) Rockwell Germany, RN as Oncology Nurse Navigator Mauro Kaufmann, RN as Oncology Nurse Navigator Kyung Rudd, MD as Consulting Physician (Radiation Oncology) Rolm Bookbinder, MD as Consulting Physician (General Surgery) Nicholas Lose, MD as Consulting Physician (Hematology and Oncology)  DIAGNOSIS:    ICD-10-CM   1. Malignant neoplasm of upper-outer quadrant of left breast in female, estrogen receptor positive ([place])  C50.412    Z17.0     SUMMARY OF ONCOLOGIC HISTORY: Oncology History  Malignant neoplasm of upper-outer quadrant of left breast in female, estrogen receptor positive ([place])  12/03/2020 Initial Diagnosis   Screening mammogram showed a left breast mass. Diagnostic mammogram and US showed a 0.5cm mass at the 12 o'clock position. Biopsy showed IDC, grade 2, HER-2 equivocal by IHC, negative by FISH (ratio 1.26), ER+ >95%, PR+ 40%, Ki67 5%.   12/15/2020 Cancer Staging   Staging form: Breast, AJCC 8th Edition - Clinical stage from 12/15/2020: Stage IA (cT1a, cN0, cM0, G2, ER+, PR+, HER2-) - Signed by Nicholas Lose, MD on 12/15/2020 Stage prefix: Initial diagnosis Laterality: Left Staged by: Pathologist and managing physician Stage used in treatment planning: Yes National guidelines used in treatment planning: Yes Type of national guideline used in treatment planning: NCCN   12/23/2020 Surgery   Left lumpectomy Donne Hazel): IDC, grade 1, 0.6cm, clear margins, 3 left axillary lymph nodes negative for carcinoma.     CHIEF COMPLIANT: Follow-up s/p lumpectomy   INTERVAL HISTORY: Lisa Gallegos is a 65 y.o. with above-mentioned history of invasive ductal carcinoma of the left breast. She underwent a left lumpectomy on 12/23/20 with Dr. Donne Hazel for which pathology showed invasive ductal carcinoma, grade 1, 0.6cm, clear margins, 3 left axillary lymph nodes negative for carcinoma. She presents to the clinic  today to discuss the pathology report and further treatment.  Other than mild discomfort in the axilla she is doing quite well without any pain or discomfort.  ALLERGIES:  has No Known Allergies.  MEDICATIONS:  Current Outpatient Medications  Medication Sig Dispense Refill  . ALPRAZolam (XANAX) 0.25 MG tablet Take 0.25 mg by mouth at bedtime as needed for anxiety.     No current facility-administered medications for this visit.    PHYSICAL EXAMINATION: ECOG PERFORMANCE STATUS: 1 - Symptomatic but completely ambulatory  Vitals:   12/31/20 0931  BP: 135/62  Pulse: 72  Resp: 16  Temp: 97.9 F (36.6 C)  SpO2: 100%   Filed Weights   12/31/20 0931  Weight: 173 lb 1.6 oz (78.5 kg)    LABORATORY DATA:  I have reviewed the data as listed CMP Latest Ref Rng & Units 12/15/2020 05/09/2018 04/05/2018  Glucose 70 - 99 mg/dL 98 125(H) 100(H)  BUN 8 - 23 mg/dL _0 Creatinine 0.44 - 1.00 mg/dL 0.88 0.96 0.84  Sodium 135 - 145 mmol/L 141 136 140  Potassium 3.5 - 5.1 mmol/L 4.5 3.7 4.4  Chloride 98 - 111 mmol/L 105 102 104  CO2 22 - 32 mmol/L _1 Calcium 8.9 - 10.3 mg/dL 9.5 9.2 9.8  Total Protein 6.5 - 8.1 g/dL 7.5 7.3 7.6  Total Bilirubin 0.3 - 1.2 mg/dL 0.4 0.6 0.4  Alkaline Phos 38 - 126 U/L 98 81 84  AST 15 - 41 U/L _2 ALT 0 - 44 U/L 36 17 16    Lab Results  Component Value Date   WBC 6.4 12/15/2020   HGB  13.6 12/15/2020   HCT 42.5 12/15/2020   MCV 88.7 12/15/2020   PLT 267 12/15/2020   NEUTROABS 3.1 12/15/2020    ASSESSMENT & PLAN:  Malignant neoplasm of upper-outer quadrant of left breast in female, estrogen receptor positive ([place]) 12/23/2020: Left lumpectomy Donne Hazel): IDC, grade 1, 0.6cm, clear margins, 3 left axillary lymph nodes negative for carcinoma.HER-2 equivocal by IHC, negative by FISH (ratio 1.26), ER+ >95%, PR+ 40%, Ki67 5%  Pathology counseling: I discussed the final pathology report of the patient provided  a copy of this report. I  discussed the margins as well as lymph node surgeries. We also discussed the final staging along with previously performed ER/PR and HER-2/neu testing.  Recommendation: 1.  Adjuvant radiation therapy followed by 2. adjuvant antiestrogen therapy  We did not feel the need for sending for Oncotype DX because the tumor is grade 1 and it is extremely small. Return to clinic after radiation to start antiestrogen therapy.    No orders of the defined types were placed in this encounter.  The patient has a good understanding of the overall plan. she agrees with it. she will call with any problems that may develop before the next visit here.  Total time spent: 30 mins including face to face time and time spent for planning, charting and coordination of care  Rulon Eisenmenger, MD, MPH 12/31/2020  I, Molly Dorshimer, am acting as scribe for Dr. Nicholas Lose.  I have reviewed the above documentation for accuracy and completeness, and I agree with the above.

## 2020-12-31 ENCOUNTER — Other Ambulatory Visit: Payer: Self-pay

## 2020-12-31 ENCOUNTER — Inpatient Hospital Stay (HOSPITAL_BASED_OUTPATIENT_CLINIC_OR_DEPARTMENT_OTHER): Payer: BC Managed Care – PPO | Admitting: Hematology and Oncology

## 2020-12-31 DIAGNOSIS — Z8249 Family history of ischemic heart disease and other diseases of the circulatory system: Secondary | ICD-10-CM | POA: Diagnosis not present

## 2020-12-31 DIAGNOSIS — Z800 Family history of malignant neoplasm of digestive organs: Secondary | ICD-10-CM | POA: Diagnosis not present

## 2020-12-31 DIAGNOSIS — C50412 Malignant neoplasm of upper-outer quadrant of left female breast: Secondary | ICD-10-CM | POA: Diagnosis not present

## 2020-12-31 DIAGNOSIS — Z170 Estrogen receptor positive status [ER+]: Secondary | ICD-10-CM

## 2020-12-31 NOTE — Assessment & Plan Note (Signed)
12/23/2020: Left lumpectomy Donne Hazel): IDC, grade 1, 0.6cm, clear margins, 3 left axillary lymph nodes negative for carcinoma.HER-2 equivocal by IHC, negative by FISH (ratio 1.26), ER+ >95%, PR+ 40%, Ki67 5%  Pathology counseling: I discussed the final pathology report of the patient provided  a copy of this report. I discussed the margins as well as lymph node surgeries. We also discussed the final staging along with previously performed ER/PR and HER-2/neu testing.  Recommendation: 1.  Adjuvant radiation therapy followed by 2. adjuvant antiestrogen therapy  We did not feel the need for sending for Oncotype DX because the tumor is grade 1 and it is extremely small. Return to clinic after radiation to start antiestrogen therapy.

## 2021-01-05 ENCOUNTER — Encounter: Payer: Self-pay | Admitting: *Deleted

## 2021-01-13 ENCOUNTER — Ambulatory Visit: Payer: BC Managed Care – PPO | Admitting: Physical Therapy

## 2021-01-13 ENCOUNTER — Ambulatory Visit
Admission: RE | Admit: 2021-01-13 | Discharge: 2021-01-13 | Disposition: A | Discharge status: Home / Self Care | Payer: BC Managed Care – PPO | Source: Ambulatory Visit | Attending: Radiation Oncology | Admitting: Radiation Oncology

## 2021-01-13 ENCOUNTER — Other Ambulatory Visit: Payer: Self-pay

## 2021-01-13 ENCOUNTER — Encounter: Payer: Self-pay | Admitting: Radiation Oncology

## 2021-01-13 VITALS — BP 144/79 | HR 74 | Resp 20 | Wt 173.2 lb

## 2021-01-13 DIAGNOSIS — Z800 Family history of malignant neoplasm of digestive organs: Secondary | ICD-10-CM | POA: Diagnosis not present

## 2021-01-13 DIAGNOSIS — C50412 Malignant neoplasm of upper-outer quadrant of left female breast: Secondary | ICD-10-CM | POA: Insufficient documentation

## 2021-01-13 DIAGNOSIS — Z170 Estrogen receptor positive status [ER+]: Secondary | ICD-10-CM | POA: Insufficient documentation

## 2021-01-13 DIAGNOSIS — Z79899 Other long term (current) drug therapy: Secondary | ICD-10-CM | POA: Insufficient documentation

## 2021-01-13 DIAGNOSIS — Z801 Family history of malignant neoplasm of trachea, bronchus and lung: Secondary | ICD-10-CM | POA: Insufficient documentation

## 2021-01-13 DIAGNOSIS — Z510 Encounter for antineoplastic radiation therapy: Secondary | ICD-10-CM | POA: Insufficient documentation

## 2021-01-13 DIAGNOSIS — F419 Anxiety disorder, unspecified: Secondary | ICD-10-CM | POA: Diagnosis not present

## 2021-01-13 NOTE — Progress Notes (Signed)
Radiation Oncology         [phone] ________________________________  Name: Lisa Gallegos        MRN: [id number]  Date of Service: 01/13/2021 DOB: 21-Nov-1955  OE:[id number], Yvetta Coder, FNP  Nicholas Lose, MD     REFERRING PHYSICIAN: Nicholas Lose, MD   DIAGNOSIS: The encounter diagnosis was Malignant neoplasm of upper-outer quadrant of left breast in female, estrogen receptor positive ([place]).   HISTORY OF PRESENT ILLNESS: Lisa Gallegos is a 65 y.o. female originally seen in the multidisciplinary breast clinic for a new diagnosis of left breast cancer. The patient was noted to have a screening detected mass in the left breast. Diagnostic imaging revealed a 5 mm mass in the 12:00 position. Her axilla is negative for adenopathy. A biopsy on 11/30/20 revealed a grade 2 invasive ductal carcinoma, and her ER/PR was positive, HER2 negative by FISH and her Ki 67 was 5%.  Since her last visit, she underwent left lumpectomy on 12/23/2020 of the left breast with left sentinel node biopsy, final pathology revealed a grade 1 invasive ductal carcinoma spanning 6 mm with negative margins 3 sampled lymph nodes were negative for disease, and given that her tumor is grade 1 and extremely small Dr. Lindi Adie did not recommend Oncotype scoring.  She is seen today to discuss moving forward with adjuvant radiotherapy.    PREVIOUS RADIATION THERAPY: No   PAST MEDICAL HISTORY:  Past Medical History:  Diagnosis Date  . Anxiety   . Cancer ([facility]) 11/2020   left breast IDC  . Family history of pancreatic cancer 12/16/2020       PAST SURGICAL HISTORY: Past Surgical History:  Procedure Laterality Date  . APPENDECTOMY    . BREAST LUMPECTOMY WITH RADIOACTIVE SEED AND AXILLARY LYMPH NODE DISSECTION Left 12/23/2020   Procedure: LEFT BREAST LUMPECTOMY WITH RADIOACTIVE SEED AND LEFT AXILLARY LYMPH NODE BIOPSY;  Surgeon: Rolm Bookbinder, MD;  Location: [facility];  Service: General;  Laterality: Left;  PEC  BLOCK; START TIME OF 11:30 AM FOR 60 MINUTES ROOM 2 [place] IQ  . CESAREAN SECTION  1985   FTP  . KIDNEY STONE SURGERY     laser surgery  . LAPAROSCOPIC APPENDECTOMY N/A 05/10/2018   Procedure: APPENDECTOMY LAPAROSCOPIC;  Surgeon: Excell Seltzer, MD;  Location: [facility];  Service: General;  Laterality: N/A;     FAMILY HISTORY:  Family History  Problem Relation Age of Onset  . Pancreatic cancer Mother 35  . Heart disease Father   . Lung cancer Maternal Uncle        dx after 50, smoking hx  . Breast cancer Neg Hx      SOCIAL HISTORY:  reports that she has never smoked. She has never used smokeless tobacco. She reports previous alcohol use. She reports that she does not use drugs. The patient is in a relationship with her life partner Lisa Gallegos. She works as an Scientist, water quality for [employer].   ALLERGIES: Patient has no known allergies.   MEDICATIONS:  Current Outpatient Medications  Medication Sig Dispense Refill  . ALPRAZolam (XANAX) 0.25 MG tablet Take 0.25 mg by mouth at bedtime as needed for anxiety.     No current facility-administered medications for this encounter.     REVIEW OF SYSTEMS: On review of systems, the patient reports that she is doing well overall. She reports she is doing very well and is pleased with how well she's been healing. She denies any specific concerns about her breast at this  time. She's interested in knowing about side effects of radiation and her daily activities during treatment.       PHYSICAL EXAM:  Wt Readings from Last 3 Encounters:  12/31/20 173 lb 1.6 oz (78.5 kg)  12/23/20 170 lb 6.7 oz (77.3 kg)  12/15/20 171 lb 11.2 oz (77.9 kg)   Temp Readings from Last 3 Encounters:  12/31/20 97.9 F (36.6 C) (Tympanic)  12/23/20 98 F (36.7 C)  12/15/20 98.1 F (36.7 C) (Temporal)   BP Readings from Last 3 Encounters:  12/31/20 135/62  12/23/20 129/63  12/15/20 (!) 152/73   Pulse Readings from Last 3  Encounters:  12/31/20 72  12/23/20 78  12/15/20 78    In general this is a well appearing caucasian female in no acute distress. She's alert and oriented x4 and appropriate throughout the examination. Cardiopulmonary assessment is negative for acute distress and she exhibits normal effort. Bilateral breast exam is deferred.    ECOG =0  0 - Asymptomatic (Fully active, able to carry on all predisease activities without restriction)  1 - Symptomatic but completely ambulatory (Restricted in physically strenuous activity but ambulatory and able to carry out work of a light or sedentary nature. For example, light housework, office work)  2 - Symptomatic, <50% in bed during the day (Ambulatory and capable of all self care but unable to carry out any work activities. Up and about more than 50% of waking hours)  3 - Symptomatic, >50% in bed, but not bedbound (Capable of only limited self-care, confined to bed or chair 50% or more of waking hours)  4 - Bedbound (Completely disabled. Cannot carry on any self-care. Totally confined to bed or chair)  5 - Death   Eustace Pen MM, Creech RH, Tormey DC, et al. [phone]). "Toxicity and response criteria of the [facility]". [place] Oncol. 5 (6): 649-55    LABORATORY DATA:  Lab Results  Component Value Date   WBC 6.4 12/15/2020   HGB 13.6 12/15/2020   HCT 42.5 12/15/2020   MCV 88.7 12/15/2020   PLT 267 12/15/2020   Lab Results  Component Value Date   NA 141 12/15/2020   K 4.5 12/15/2020   CL 105 12/15/2020   CO2 28 12/15/2020   Lab Results  Component Value Date   ALT 36 12/15/2020   AST 26 12/15/2020   ALKPHOS 98 12/15/2020   BILITOT 0.4 12/15/2020      RADIOGRAPHY: NM Sentinel Node Inj-No Rpt (Breast)  Result Date: 12/23/2020 Sulfur colloid was injected by the nuclear medicine technologist for melanoma sentinel node.   MM Breast Surgical Specimen  Result Date: 12/23/2020 CLINICAL DATA:  Post left breast  lumpectomy. EXAM: SPECIMEN RADIOGRAPH OF THE LEFT BREAST COMPARISON:  Previous exam(s). FINDINGS: Status post excision of the left breast. The radioactive seed and ribbon shaped biopsy marker clip are present, completely intact, and were marked for pathology. IMPRESSION: Specimen radiograph of the left breast. Electronically Signed   By: Everlean Alstrom M.D.   On: 12/23/2020 11:27   MM LT RADIOACTIVE SEED LOC MAMMO GUIDE  Result Date: 12/22/2020 CLINICAL DATA:  Seed placement EXAM: MAMMOGRAPHIC GUIDED RADIOACTIVE SEED LOCALIZATION OF THE LEFT BREAST COMPARISON:  Previous exam(s). FINDINGS: Patient presents for radioactive seed localization prior to surgery. I met with the patient and we discussed the procedure of seed localization including benefits and alternatives. We discussed the high likelihood of a successful procedure. We discussed the risks of the procedure including infection, bleeding, tissue  injury and further surgery. We discussed the low dose of radioactivity involved in the procedure. Informed, written consent was given. The usual time-out protocol was performed immediately prior to the procedure. Using mammographic guidance, sterile technique, 1% lidocaine and an I-125 radioactive seed, the biopsy clip/known malignancy was localized using a superior approach. The follow-up mammogram images confirm the seed in the expected location and were marked for the surgeon. Follow-up survey of the patient confirms presence of the radioactive seed. Order number of I-125 seed:  [id number]. Total activity:  0.272 millicurie reference Date: August 25, 2020 The patient tolerated the procedure well and was released from the [facility]. She was given instructions regarding seed removal. IMPRESSION: Radioactive seed localization left breast. No apparent complications. Electronically Signed   By: Dorise Bullion III M.D   On: 12/22/2020 11:59       IMPRESSION/PLAN: 1. 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 Dr. Lisbeth Renshaw discusses the pathology findings and reviews the nature of left breast disease. She's done well since surgery was performed. She would benefit from external radiotherapy to the breast  to reduce risks of local recurrence followed by antiestrogen therapy. We discussed the risks, benefits, short, and long term effects of radiotherapy, as well as the curative intent, and the patient is interested in proceeding. Dr. Lisbeth Renshaw discusses the delivery and logistics of radiotherapy and recommends 4 weeks of radiotherapy to the left breast with deep inspiration breath hold technique. Written consent is obtained and placed in the chart, a copy was provided to the patient. She will simulate today and proceed with radiation in the next week or two.   In a visit lasting 45 minutes, greater than 50% of the time was spent face to face discussing the patient's condition, in preparation for the discussion, and coordinating the patient's care.   The above documentation reflects my direct findings during this shared patient visit. Please see the separate note by Dr. Lisbeth Renshaw on this date for the remainder of the patient's plan of care.    Carola Rhine, [facility]    **Disclaimer: This note was dictated with voice recognition software. Similar sounding words can inadvertently be transcribed and this note may contain transcription errors which may not have been corrected upon publication of note.**

## 2021-01-14 ENCOUNTER — Telehealth: Payer: Self-pay | Admitting: Hematology and Oncology

## 2021-01-14 ENCOUNTER — Encounter: Payer: Self-pay | Admitting: *Deleted

## 2021-01-14 NOTE — Telephone Encounter (Signed)
Scheduled appt per 4/1 sch msg. Called pt, no answer. Left msg with appt date and time.  

## 2021-01-17 ENCOUNTER — Other Ambulatory Visit: Payer: Self-pay

## 2021-01-17 ENCOUNTER — Encounter: Payer: Self-pay | Admitting: Physical Therapy

## 2021-01-17 ENCOUNTER — Other Ambulatory Visit: Payer: Self-pay | Admitting: *Deleted

## 2021-01-17 ENCOUNTER — Ambulatory Visit: Payer: BC Managed Care – PPO | Attending: General Surgery | Admitting: Physical Therapy

## 2021-01-17 DIAGNOSIS — R293 Abnormal posture: Secondary | ICD-10-CM | POA: Diagnosis not present

## 2021-01-17 DIAGNOSIS — Z483 Aftercare following surgery for neoplasm: Secondary | ICD-10-CM | POA: Insufficient documentation

## 2021-01-17 DIAGNOSIS — C50412 Malignant neoplasm of upper-outer quadrant of left female breast: Secondary | ICD-10-CM | POA: Diagnosis not present

## 2021-01-17 DIAGNOSIS — Z170 Estrogen receptor positive status [ER+]: Secondary | ICD-10-CM | POA: Insufficient documentation

## 2021-01-17 NOTE — Therapy (Signed)
[place], [place], [address] Phone: [phone]   Fax:  [phone]  Physical Therapy Treatment  Patient Details  Name: Lisa Gallegos MRN: [id number] Date of Birth: June 10, 1956 Referring Provider (PT): Dr. Rolm Bookbinder   Encounter Date: 01/17/2021   PT End of Session - 01/17/21 1036    Visit Number 2    Number of Visits 2    PT Start Time 1004    PT Stop Time 1040    PT Time Calculation (min) 36 min    Activity Tolerance Patient tolerated treatment well    Behavior During Therapy [facility] for tasks assessed/performed           Past Medical History:  Diagnosis Date  . Anxiety   . Cancer ([facility]) 11/2020   left breast IDC  . Family history of pancreatic cancer 12/16/2020    Past Surgical History:  Procedure Laterality Date  . APPENDECTOMY    . BREAST LUMPECTOMY WITH RADIOACTIVE SEED AND AXILLARY LYMPH NODE DISSECTION Left 12/23/2020   Procedure: LEFT BREAST LUMPECTOMY WITH RADIOACTIVE SEED AND LEFT AXILLARY LYMPH NODE BIOPSY;  Surgeon: Rolm Bookbinder, MD;  Location: [facility];  Service: General;  Laterality: Left;  PEC BLOCK; START TIME OF 11:30 AM FOR 60 MINUTES ROOM 2 [place] IQ  . CESAREAN SECTION  1985   FTP  . KIDNEY STONE SURGERY     laser surgery  . LAPAROSCOPIC APPENDECTOMY N/A 05/10/2018   Procedure: APPENDECTOMY LAPAROSCOPIC;  Surgeon: Excell Seltzer, MD;  Location: [facility];  Service: General;  Laterality: N/A;    There were no vitals filed for this visit.   Subjective Assessment - 01/17/21 1005    Subjective Patient underwent a left lumpectomy and sentinel node biopsy (3 negative nodes) on 12/23/2020. She begins radiation 01/24/2021 for 20 treatments followed by anti-estrogen therapy.    Pertinent History Patient was diagnosed on 11/15/2020 with left grade II invasive ductal carcinoma breast cancer. Patient underwent a left lumpectomy and sentinel node biopsy (3  negative nodes) on 12/23/2020. It is ER/PR positive and HER2 negative with a Ki67 of 5%.    Patient Stated Goals See how my arm is doing    Currently in Pain? No/denies              [facility] PT Assessment - 01/17/21 0001      Assessment   Medical Diagnosis s/p left lumpectomy and SLNB    Referring Provider (PT) Dr. Rolm Bookbinder    Onset Date/Surgical Date 12/23/20    Hand Dominance Right    Prior Therapy Baselines      Precautions   Precautions Other (comment)    Precaution Comments recent surgery; left arm lymphedema risk      Restrictions   Weight Bearing Restrictions No      Balance Screen   Has the patient fallen in the past 6 months No    Has the patient had a decrease in activity level because of a fear of falling?  No    Is the patient reluctant to leave their home because of a fear of falling?  No      Home Social worker Private residence    Living Arrangements Other (Comment)   Lisa Gallegos   Available Help at Discharge Family      Prior Function   Level of Independence Independent    Vocation Full time employment    Vocation Requirements Admin Asst at [employer]  Leisure She has returned to walking a mile per day      Cognition   Overall Cognitive Status Within Functional Limits for tasks assessed      Observation/Other Assessments   Observations Left breast and axillary incisions both appear to be well healed. No redness or edema present. Slightly thick scar tissue present but no pain with palpation.      Posture/Postural Control   Posture/Postural Control Postural limitations    Postural Limitations Forward head;Rounded Shoulders      ROM / Strength   AROM / PROM / Strength AROM      AROM   AROM Assessment Site Shoulder    Right/Left Shoulder Left    Left Shoulder Extension 42 Degrees    Left Shoulder Flexion 150 Degrees    Left Shoulder ABduction 153 Degrees    Left Shoulder Internal Rotation 69 Degrees    Left Shoulder  External Rotation 85 Degrees      Strength   Overall Strength Within functional limits for tasks performed             LYMPHEDEMA/ONCOLOGY QUESTIONNAIRE - 01/17/21 0001      Type   Cancer Type Left breast cancer      Surgeries   Lumpectomy Date 12/23/20    Sentinel Lymph Node Biopsy Date 12/23/20    Number Lymph Nodes Removed 3      Treatment   Active Chemotherapy Treatment No    Past Chemotherapy Treatment No    Active Radiation Treatment No    Past Radiation Treatment No    Current Hormone Treatment No    Past Hormone Therapy No      What other symptoms do you have   Are you Having Heaviness or Tightness No    Are you having Pain No    Are you having pitting edema No    Is it Hard or Difficult finding clothes that fit No    Do you have infections No    Is there Decreased scar mobility No    Stemmer Sign No      Lymphedema Assessments   Lymphedema Assessments Upper extremities      Right Upper Extremity Lymphedema   10 cm Proximal to Olecranon Process 28.8 cm    Olecranon Process 25 cm    10 cm Proximal to Ulnar Styloid Process 23 cm    Just Proximal to Ulnar Styloid Process 15.4 cm    Across Hand at [employer] 18.3 cm    At [place] of 2nd Digit 6.3 cm      Left Upper Extremity Lymphedema   10 cm Proximal to Olecranon Process 29.6 cm    Olecranon Process 25.2 cm    10 cm Proximal to Ulnar Styloid Process 22.8 cm    Just Proximal to Ulnar Styloid Process 15.6 cm    Across Hand at [employer] 18.5 cm    At [place] of 2nd Digit 5.8 cm              Quick Dash - 01/17/21 0001    Open a tight or new jar No difficulty    Do heavy household chores (wash walls, wash floors) No difficulty    Carry a shopping bag or briefcase No difficulty    Wash your back No difficulty    Use a knife to cut food No difficulty    Recreational activities in which you take some force or impact through your arm, shoulder, or hand (golf,  hammering, tennis) No difficulty     During the past week, to what extent has your arm, shoulder or hand problem interfered with your normal social activities with family, friends, neighbors, or groups? Not at all    During the past week, to what extent has your arm, shoulder or hand problem limited your work or other regular daily activities Not at all    Arm, shoulder, or hand pain. None    Tingling (pins and needles) in your arm, shoulder, or hand None    Difficulty Sleeping No difficulty    DASH Score 0 %                          PT Education - 01/17/21 1035    Education Details Aftercare; scar massage; pelvic floor rehab    Person(s) Educated Patient    Methods Explanation;Demonstration;Handout    Comprehension Verbalized understanding;Returned demonstration               PT Long Term Goals - 01/17/21 1046      PT LONG TERM GOAL #1   Title Patient will demonstrate she has regained full shoulder ROM and function post operatively compared to baselines.    Time 8    Period Weeks    Status Achieved                 Plan - 01/17/21 1036    Clinical Impression Statement Patient underwent a left lumpectomy and sentinel node biopsy (3 negative nodes) on 12/23/2020. She begins radiation 01/24/2021 for 20 treatments followed by anti-estrogen therapy. She has regained full shoulder ROM and function, has returned to work and to her regular exercise and her incisions are well healed. She plans to attend the After Breast Cancer Class on 01/31/2021 but otherwise has no PT needs at this time.    PT Treatment/Interventions ADLs/Self Care Home Management;Therapeutic exercise;Patient/family education    PT Next Visit Plan D/C    PT Home Exercise Plan Post op shoulder ROM HEP    Consulted and Agree with Plan of Care Patient           Patient will benefit from skilled therapeutic intervention in order to improve the following deficits and impairments:  Postural dysfunction,Decreased range of  motion,Decreased knowledge of precautions,Impaired UE functional use,Pain,Decreased scar mobility  Visit Diagnosis: Malignant neoplasm of upper-outer quadrant of left breast in female, estrogen receptor positive ([facility])  Abnormal posture  Aftercare following surgery for neoplasm     Problem List Patient Active Problem List   Diagnosis Date Noted  . Family history of pancreatic cancer 12/16/2020  . Genetic testing 12/16/2020  . Malignant neoplasm of upper-outer quadrant of left breast in female, estrogen receptor positive ([place]) 12/03/2020  . Appendicitis 05/10/2018  . Situational anxiety 04/05/2018  . Vitamin D deficiency 04/05/2018  . Routine physical examination 12/27/2016  . Menopausal symptoms 03/24/2015  . Hyperlipidemia 03/24/2015  . Seborrheic keratoses 03/24/2015  . Weight gain 09/15/2014  . Elevated BP 09/15/2014   PHYSICAL THERAPY DISCHARGE SUMMARY  Visits from Start of Care: 2  Current functional level related to goals / functional outcomes: Goals met. See above for objective measurements.   Remaining deficits: None   Education / Equipment: Lymphedema education and HEP Plan: Patient agrees to discharge.  Patient goals were met. Patient is being discharged due to meeting the stated rehab goals.  ?????         Annia Friendly, [place] 01/17/21 10:48  [place], [place], [address] Phone: [phone]   Fax:  [phone]  Name: Kashawna Manzer MRN: [id number] Date of Birth: 09/28/1956

## 2021-01-17 NOTE — Patient Instructions (Addendum)
            [facility]         [address], [place] [address]         [phone]         Annia Friendly, PT, CLT   After Breast Cancer Class It is recommended you attend the ABC class to be educated on lymphedema risk reduction. This class is free of charge and lasts for 1 hour. It is a 1-time class.  You are scheduled for April 18th at 11:00. We will send you a link. You need to download Webex.  Scar massage You can begin scar massage gently to both incisions using coconut oil or vitamin E cream a few minutes each day.   Home exercise Program Continue your exercises until you complete radiation.   Follow up PT: It is recommended you return every 3 months for the first 3 years following surgery to be assessed on the SOZO machine for an L-Dex score. This helps prevent clinically significant lymphedema in 95% of patients. These follow up screens are 15 minute appointments that you are not billed for. You are scheduled for June 13th at 4:30 here at [address].   The pelvic floor PT at our [place] clinic is Earlie Counts who specializes in seeing cancer patients. Their number is [phone]. Varney Biles will enter a referral and they should call you. You can also call them if you don't hear from them by the end of the week.

## 2021-01-20 DIAGNOSIS — C50412 Malignant neoplasm of upper-outer quadrant of left female breast: Secondary | ICD-10-CM | POA: Insufficient documentation

## 2021-01-20 DIAGNOSIS — Z170 Estrogen receptor positive status [ER+]: Secondary | ICD-10-CM | POA: Insufficient documentation

## 2021-01-24 ENCOUNTER — Other Ambulatory Visit: Payer: Self-pay

## 2021-01-24 ENCOUNTER — Ambulatory Visit
Admission: RE | Admit: 2021-01-24 | Discharge: 2021-01-24 | Disposition: A | Discharge status: Home / Self Care | Payer: BC Managed Care – PPO | Source: Ambulatory Visit | Attending: Radiation Oncology | Admitting: Radiation Oncology

## 2021-01-24 DIAGNOSIS — Z170 Estrogen receptor positive status [ER+]: Secondary | ICD-10-CM | POA: Diagnosis not present

## 2021-01-24 DIAGNOSIS — C50412 Malignant neoplasm of upper-outer quadrant of left female breast: Secondary | ICD-10-CM | POA: Diagnosis not present

## 2021-01-25 ENCOUNTER — Ambulatory Visit
Admission: RE | Admit: 2021-01-25 | Discharge: 2021-01-25 | Disposition: A | Discharge status: Home / Self Care | Payer: BC Managed Care – PPO | Source: Ambulatory Visit | Attending: Radiation Oncology | Admitting: Radiation Oncology

## 2021-01-25 DIAGNOSIS — C50412 Malignant neoplasm of upper-outer quadrant of left female breast: Secondary | ICD-10-CM | POA: Diagnosis not present

## 2021-01-25 DIAGNOSIS — Z170 Estrogen receptor positive status [ER+]: Secondary | ICD-10-CM | POA: Diagnosis not present

## 2021-01-26 ENCOUNTER — Ambulatory Visit
Admission: RE | Admit: 2021-01-26 | Discharge: 2021-01-26 | Disposition: A | Discharge status: Home / Self Care | Payer: BC Managed Care – PPO | Source: Ambulatory Visit | Attending: Radiation Oncology | Admitting: Radiation Oncology

## 2021-01-26 ENCOUNTER — Other Ambulatory Visit: Payer: Self-pay

## 2021-01-26 DIAGNOSIS — C50412 Malignant neoplasm of upper-outer quadrant of left female breast: Secondary | ICD-10-CM | POA: Diagnosis not present

## 2021-01-26 DIAGNOSIS — Z170 Estrogen receptor positive status [ER+]: Secondary | ICD-10-CM | POA: Diagnosis not present

## 2021-01-27 ENCOUNTER — Other Ambulatory Visit: Payer: Self-pay

## 2021-01-27 ENCOUNTER — Ambulatory Visit
Admission: RE | Admit: 2021-01-27 | Discharge: 2021-01-27 | Disposition: A | Discharge status: Home / Self Care | Payer: BC Managed Care – PPO | Source: Ambulatory Visit | Attending: Radiation Oncology | Admitting: Radiation Oncology

## 2021-01-27 DIAGNOSIS — Z170 Estrogen receptor positive status [ER+]: Secondary | ICD-10-CM | POA: Diagnosis not present

## 2021-01-27 DIAGNOSIS — C50412 Malignant neoplasm of upper-outer quadrant of left female breast: Secondary | ICD-10-CM | POA: Diagnosis not present

## 2021-01-28 ENCOUNTER — Ambulatory Visit
Admission: RE | Admit: 2021-01-28 | Discharge: 2021-01-28 | Disposition: A | Discharge status: Home / Self Care | Payer: BC Managed Care – PPO | Source: Ambulatory Visit | Attending: Radiation Oncology | Admitting: Radiation Oncology

## 2021-01-28 DIAGNOSIS — C50412 Malignant neoplasm of upper-outer quadrant of left female breast: Secondary | ICD-10-CM | POA: Diagnosis not present

## 2021-01-28 DIAGNOSIS — Z170 Estrogen receptor positive status [ER+]: Secondary | ICD-10-CM

## 2021-01-28 MED ORDER — RADIAPLEXRX EX GEL: Freq: Once | CUTANEOUS | Status: AC

## 2021-01-28 MED ORDER — ALRA NON-METALLIC DEODORANT (RAD-ONC)
1.0000 "application " | Freq: Once | TOPICAL | Status: AC
  Administered 2021-01-28: 1 via TOPICAL

## 2021-01-28 NOTE — Progress Notes (Signed)
Pt here for patient teaching.  Pt given Radiation and You booklet, skin care instructions, Alra deodorant and Radiaplex gel.  Reviewed areas of pertinence such as fatigue, skin changes, breast tenderness and breast swelling . Pt able to give teach back of to pat skin, use unscented/gentle soap and use baby wipes,apply Radiaplex bid, avoid applying anything to skin within 4 hours of treatment, avoid wearing an under wire bra and to use an electric razor if they must shave. Pt demonstrated understanding of information given and will contact nursing with any questions or concerns.     Http://rtanswers.org/treatmentinformation/whattoexpect/index

## 2021-01-31 ENCOUNTER — Other Ambulatory Visit: Payer: Self-pay

## 2021-01-31 ENCOUNTER — Ambulatory Visit
Admission: RE | Admit: 2021-01-31 | Discharge: 2021-01-31 | Disposition: A | Discharge status: Home / Self Care | Payer: BC Managed Care – PPO | Source: Ambulatory Visit | Attending: Radiation Oncology | Admitting: Radiation Oncology

## 2021-01-31 DIAGNOSIS — Z170 Estrogen receptor positive status [ER+]: Secondary | ICD-10-CM | POA: Diagnosis not present

## 2021-01-31 DIAGNOSIS — C50412 Malignant neoplasm of upper-outer quadrant of left female breast: Secondary | ICD-10-CM | POA: Diagnosis not present

## 2021-02-01 ENCOUNTER — Ambulatory Visit
Admission: RE | Admit: 2021-02-01 | Discharge: 2021-02-01 | Disposition: A | Discharge status: Home / Self Care | Payer: BC Managed Care – PPO | Source: Ambulatory Visit | Attending: Radiation Oncology | Admitting: Radiation Oncology

## 2021-02-01 DIAGNOSIS — Z170 Estrogen receptor positive status [ER+]: Secondary | ICD-10-CM | POA: Diagnosis not present

## 2021-02-01 DIAGNOSIS — C50412 Malignant neoplasm of upper-outer quadrant of left female breast: Secondary | ICD-10-CM | POA: Diagnosis not present

## 2021-02-02 ENCOUNTER — Other Ambulatory Visit: Payer: Self-pay

## 2021-02-02 ENCOUNTER — Ambulatory Visit
Admission: RE | Admit: 2021-02-02 | Discharge: 2021-02-02 | Disposition: A | Discharge status: Home / Self Care | Payer: BC Managed Care – PPO | Source: Ambulatory Visit | Attending: Radiation Oncology | Admitting: Radiation Oncology

## 2021-02-02 DIAGNOSIS — Z170 Estrogen receptor positive status [ER+]: Secondary | ICD-10-CM | POA: Diagnosis not present

## 2021-02-02 DIAGNOSIS — C50412 Malignant neoplasm of upper-outer quadrant of left female breast: Secondary | ICD-10-CM | POA: Diagnosis not present

## 2021-02-03 ENCOUNTER — Ambulatory Visit
Admission: RE | Admit: 2021-02-03 | Discharge: 2021-02-03 | Disposition: A | Discharge status: Home / Self Care | Payer: BC Managed Care – PPO | Source: Ambulatory Visit | Attending: Radiation Oncology | Admitting: Radiation Oncology

## 2021-02-03 DIAGNOSIS — Z170 Estrogen receptor positive status [ER+]: Secondary | ICD-10-CM | POA: Diagnosis not present

## 2021-02-03 DIAGNOSIS — C50412 Malignant neoplasm of upper-outer quadrant of left female breast: Secondary | ICD-10-CM | POA: Diagnosis not present

## 2021-02-04 ENCOUNTER — Other Ambulatory Visit: Payer: Self-pay

## 2021-02-04 ENCOUNTER — Ambulatory Visit
Admission: RE | Admit: 2021-02-04 | Discharge: 2021-02-04 | Disposition: A | Discharge status: Home / Self Care | Payer: BC Managed Care – PPO | Source: Ambulatory Visit | Attending: Radiation Oncology | Admitting: Radiation Oncology

## 2021-02-04 DIAGNOSIS — Z170 Estrogen receptor positive status [ER+]: Secondary | ICD-10-CM | POA: Diagnosis not present

## 2021-02-04 DIAGNOSIS — C50412 Malignant neoplasm of upper-outer quadrant of left female breast: Secondary | ICD-10-CM

## 2021-02-04 MED ORDER — RADIAPLEXRX EX GEL: Freq: Once | CUTANEOUS | Status: AC

## 2021-02-07 ENCOUNTER — Ambulatory Visit
Admission: RE | Admit: 2021-02-07 | Discharge: 2021-02-07 | Disposition: A | Discharge status: Home / Self Care | Payer: BC Managed Care – PPO | Source: Ambulatory Visit | Attending: Radiation Oncology | Admitting: Radiation Oncology

## 2021-02-07 ENCOUNTER — Other Ambulatory Visit: Payer: Self-pay

## 2021-02-07 DIAGNOSIS — Z170 Estrogen receptor positive status [ER+]: Secondary | ICD-10-CM | POA: Diagnosis not present

## 2021-02-07 DIAGNOSIS — C50412 Malignant neoplasm of upper-outer quadrant of left female breast: Secondary | ICD-10-CM | POA: Diagnosis not present

## 2021-02-08 ENCOUNTER — Ambulatory Visit
Admission: RE | Admit: 2021-02-08 | Discharge: 2021-02-08 | Disposition: A | Discharge status: Home / Self Care | Payer: BC Managed Care – PPO | Source: Ambulatory Visit | Attending: Radiation Oncology | Admitting: Radiation Oncology

## 2021-02-08 DIAGNOSIS — Z170 Estrogen receptor positive status [ER+]: Secondary | ICD-10-CM | POA: Diagnosis not present

## 2021-02-08 DIAGNOSIS — C50412 Malignant neoplasm of upper-outer quadrant of left female breast: Secondary | ICD-10-CM | POA: Diagnosis not present

## 2021-02-09 ENCOUNTER — Ambulatory Visit
Admission: RE | Admit: 2021-02-09 | Discharge: 2021-02-09 | Disposition: A | Discharge status: Home / Self Care | Payer: BC Managed Care – PPO | Source: Ambulatory Visit | Attending: Radiation Oncology | Admitting: Radiation Oncology

## 2021-02-09 DIAGNOSIS — C50412 Malignant neoplasm of upper-outer quadrant of left female breast: Secondary | ICD-10-CM | POA: Diagnosis not present

## 2021-02-09 DIAGNOSIS — Z170 Estrogen receptor positive status [ER+]: Secondary | ICD-10-CM | POA: Diagnosis not present

## 2021-02-10 ENCOUNTER — Other Ambulatory Visit: Payer: Self-pay

## 2021-02-10 ENCOUNTER — Ambulatory Visit
Admission: RE | Admit: 2021-02-10 | Discharge: 2021-02-10 | Disposition: A | Discharge status: Home / Self Care | Payer: BC Managed Care – PPO | Source: Ambulatory Visit | Attending: Radiation Oncology | Admitting: Radiation Oncology

## 2021-02-10 DIAGNOSIS — C50412 Malignant neoplasm of upper-outer quadrant of left female breast: Secondary | ICD-10-CM | POA: Diagnosis not present

## 2021-02-10 DIAGNOSIS — Z170 Estrogen receptor positive status [ER+]: Secondary | ICD-10-CM | POA: Diagnosis not present

## 2021-02-11 ENCOUNTER — Ambulatory Visit
Admission: RE | Admit: 2021-02-11 | Discharge: 2021-02-11 | Disposition: A | Discharge status: Home / Self Care | Payer: BC Managed Care – PPO | Source: Ambulatory Visit | Attending: Radiation Oncology | Admitting: Radiation Oncology

## 2021-02-11 ENCOUNTER — Ambulatory Visit: Payer: BC Managed Care – PPO | Admitting: Radiation Oncology

## 2021-02-11 DIAGNOSIS — C50412 Malignant neoplasm of upper-outer quadrant of left female breast: Secondary | ICD-10-CM | POA: Diagnosis not present

## 2021-02-11 DIAGNOSIS — Z170 Estrogen receptor positive status [ER+]: Secondary | ICD-10-CM | POA: Diagnosis not present

## 2021-02-13 NOTE — Assessment & Plan Note (Addendum)
12/23/2020: Left lumpectomy Lisa Gallegos): IDC, grade 1, 0.6cm, clear margins, 3 left axillary lymph nodes negative for carcinoma.HER-2 equivocal by IHC, negative by FISH (ratio 1.26), ER+ >95%, PR+ 40%, Ki67 5%  Recommendation: 1.  Adjuvant radiation therapy 01/25/21- 02/18/21 2. adjuvant antiestrogen therapy  Anastrozole counseling: We discussed the risks and benefits of anti-estrogen therapy with aromatase inhibitors. These include but not limited to insomnia, hot flashes, mood changes, vaginal dryness, bone density loss, and weight gain. We strongly believe that the benefits far outweigh the risks. Patient understands these risks and consented to starting treatment. Planned treatment duration is 7 years.

## 2021-02-13 NOTE — Progress Notes (Signed)
Patient Care Team: Burnard Hawthorne, FNP as PCP - General (Family Medicine) Rockwell Germany, RN as Oncology Nurse Navigator Mauro Kaufmann, RN as Oncology Nurse Navigator Kyung Rudd, MD as Consulting Physician (Radiation Oncology) Rolm Bookbinder, MD as Consulting Physician (General Surgery) Nicholas Lose, MD as Consulting Physician (Hematology and Oncology)  DIAGNOSIS:    ICD-10-CM   1. Malignant neoplasm of upper-outer quadrant of left breast in female, estrogen receptor positive ([place])  C50.412    Z17.0     SUMMARY OF ONCOLOGIC HISTORY: Oncology History  Malignant neoplasm of upper-outer quadrant of left breast in female, estrogen receptor positive ([place])  12/03/2020 Initial Diagnosis   Screening mammogram showed a left breast mass. Diagnostic mammogram and US showed a 0.5cm mass at the 12 o'clock position. Biopsy showed IDC, grade 2, HER-2 equivocal by IHC, negative by FISH (ratio 1.26), ER+ >95%, PR+ 40%, Ki67 5%.   12/15/2020 Cancer Staging   Staging form: Breast, AJCC 8th Edition - Clinical stage from 12/15/2020: Stage IA (cT1a, cN0, cM0, G2, ER+, PR+, HER2-) - Signed by Nicholas Lose, MD on 12/15/2020 Stage prefix: Initial diagnosis Laterality: Left Staged by: Pathologist and managing physician Stage used in treatment planning: Yes National guidelines used in treatment planning: Yes Type of national guideline used in treatment planning: NCCN   12/23/2020 Surgery   Left lumpectomy Donne Hazel): IDC, grade 1, 0.6cm, clear margins, 3 left axillary lymph nodes negative for carcinoma.   01/25/2021 -  Radiation Therapy   Adjuvant radiation     CHIEF COMPLIANT: Follow-up to discuss antiestrogen therapy  INTERVAL HISTORY: Lisa Gallegos is a 65 y.o. with above-mentioned history of left breast cancer who underwent a lumpectomy and is currently undergoing radiation. She presents to the clinic today to discuss antiestrogen therapy.  She is tolerating radiation extremely well  without any problems or concerns.  ALLERGIES:  has No Known Allergies.  MEDICATIONS:  Current Outpatient Medications  Medication Sig Dispense Refill  . ALPRAZolam (XANAX) 0.25 MG tablet Take 0.25 mg by mouth at bedtime as needed for anxiety. (Patient not taking: Reported on 01/17/2021)     No current facility-administered medications for this visit.    PHYSICAL EXAMINATION: ECOG PERFORMANCE STATUS: 1 - Symptomatic but completely ambulatory  Vitals:   02/14/21 0852  BP: 139/69  Pulse: 75  Resp: 18  Temp: 98.1 F (36.7 C)  SpO2: 100%   Filed Weights   02/14/21 0852  Weight: 172 lb 9.6 oz (78.3 kg)     LABORATORY DATA:  I have reviewed the data as listed CMP Latest Ref Rng & Units 12/15/2020 05/09/2018 04/05/2018  Glucose 70 - 99 mg/dL 98 125(H) 100(H)  BUN 8 - 23 mg/dL '18 20 19  ' Creatinine 0.44 - 1.00 mg/dL 0.88 0.96 0.84  Sodium 135 - 145 mmol/L 141 136 140  Potassium 3.5 - 5.1 mmol/L 4.5 3.7 4.4  Chloride 98 - 111 mmol/L 105 102 104  CO2 22 - 32 mmol/L '28 24 30  ' Calcium 8.9 - 10.3 mg/dL 9.5 9.2 9.8  Total Protein 6.5 - 8.1 g/dL 7.5 7.3 7.6  Total Bilirubin 0.3 - 1.2 mg/dL 0.4 0.6 0.4  Alkaline Phos 38 - 126 U/L 98 81 84  AST 15 - 41 U/L '26 21 18  ' ALT 0 - 44 U/L 36 17 16    Lab Results  Component Value Date   WBC 6.4 12/15/2020   HGB 13.6 12/15/2020   HCT 42.5 12/15/2020   MCV 88.7 12/15/2020   PLT 267  12/15/2020   NEUTROABS 3.1 12/15/2020    ASSESSMENT & PLAN:  Malignant neoplasm of upper-outer quadrant of left breast in female, estrogen receptor positive ([place]) 12/23/2020: Left lumpectomy Donne Hazel): IDC, grade 1, 0.6cm, clear margins, 3 left axillary lymph nodes negative for carcinoma.HER-2 equivocal by IHC, negative by FISH (ratio 1.26), ER+ >95%, PR+ 40%, Ki67 5%  Recommendation: 1. Adjuvant radiation therapy 01/25/21- 02/18/21 2. Adjuvant antiestrogen therapy to start 03/16/2021  Anastrozole counseling: We discussed the risks and benefits of anti-estrogen  therapy with aromatase inhibitors. These include but not limited to insomnia, hot flashes, mood changes, vaginal dryness, bone density loss, and weight gain. We strongly believe that the benefits far outweigh the risks. Patient understands these risks and consented to starting treatment. Planned treatment duration is 7 years.  She is going through major lifestyle change.  She is eating healthier and is joining [employer]. Return to clinic in 3 months for survivorship care plan visit  No orders of the defined types were placed in this encounter.  The patient has a good understanding of the overall plan. she agrees with it. she will call with any problems that may develop before the next visit here.  Total time spent: 30 mins including face to face time and time spent for planning, charting and coordination of care  Rulon Eisenmenger, MD, MPH 02/14/2021  I, Cloyde Reams Dorshimer, am acting as scribe for Dr. Nicholas Lose.  I have reviewed the above documentation for accuracy and completeness, and I agree with the above.

## 2021-02-14 ENCOUNTER — Other Ambulatory Visit: Payer: Self-pay

## 2021-02-14 ENCOUNTER — Ambulatory Visit
Admission: RE | Admit: 2021-02-14 | Discharge: 2021-02-14 | Disposition: A | Discharge status: Home / Self Care | Payer: BC Managed Care – PPO | Source: Ambulatory Visit | Attending: Radiation Oncology | Admitting: Radiation Oncology

## 2021-02-14 ENCOUNTER — Inpatient Hospital Stay: Payer: BC Managed Care – PPO | Attending: Hematology and Oncology | Admitting: Hematology and Oncology

## 2021-02-14 VITALS — BP 139/69 | HR 75 | Temp 98.1°F | Resp 18 | Ht 65.0 in | Wt 172.6 lb

## 2021-02-14 DIAGNOSIS — Z170 Estrogen receptor positive status [ER+]: Secondary | ICD-10-CM | POA: Insufficient documentation

## 2021-02-14 DIAGNOSIS — Z780 Asymptomatic menopausal state: Secondary | ICD-10-CM

## 2021-02-14 DIAGNOSIS — Z923 Personal history of irradiation: Secondary | ICD-10-CM | POA: Diagnosis not present

## 2021-02-14 DIAGNOSIS — C50412 Malignant neoplasm of upper-outer quadrant of left female breast: Secondary | ICD-10-CM | POA: Insufficient documentation

## 2021-02-14 DIAGNOSIS — Z510 Encounter for antineoplastic radiation therapy: Secondary | ICD-10-CM | POA: Diagnosis not present

## 2021-02-14 MED ORDER — ANASTROZOLE 1 MG PO TABS: 1.0000 mg | ORAL_TABLET | Freq: Every day | ORAL | 3 refills | Status: DC

## 2021-02-15 ENCOUNTER — Ambulatory Visit
Admission: RE | Admit: 2021-02-15 | Discharge: 2021-02-15 | Disposition: A | Discharge status: Home / Self Care | Payer: BC Managed Care – PPO | Source: Ambulatory Visit | Attending: Radiation Oncology | Admitting: Radiation Oncology

## 2021-02-15 ENCOUNTER — Encounter: Payer: Self-pay | Admitting: *Deleted

## 2021-02-15 DIAGNOSIS — Z510 Encounter for antineoplastic radiation therapy: Secondary | ICD-10-CM | POA: Diagnosis not present

## 2021-02-15 DIAGNOSIS — Z170 Estrogen receptor positive status [ER+]: Secondary | ICD-10-CM | POA: Diagnosis not present

## 2021-02-15 DIAGNOSIS — C50412 Malignant neoplasm of upper-outer quadrant of left female breast: Secondary | ICD-10-CM | POA: Diagnosis not present

## 2021-02-16 ENCOUNTER — Other Ambulatory Visit: Payer: Self-pay

## 2021-02-16 ENCOUNTER — Ambulatory Visit
Admission: RE | Admit: 2021-02-16 | Discharge: 2021-02-16 | Disposition: A | Discharge status: Home / Self Care | Payer: BC Managed Care – PPO | Source: Ambulatory Visit | Attending: Radiation Oncology | Admitting: Radiation Oncology

## 2021-02-16 DIAGNOSIS — Z170 Estrogen receptor positive status [ER+]: Secondary | ICD-10-CM | POA: Diagnosis not present

## 2021-02-16 DIAGNOSIS — Z510 Encounter for antineoplastic radiation therapy: Secondary | ICD-10-CM | POA: Diagnosis not present

## 2021-02-16 DIAGNOSIS — C50412 Malignant neoplasm of upper-outer quadrant of left female breast: Secondary | ICD-10-CM | POA: Diagnosis not present

## 2021-02-17 ENCOUNTER — Ambulatory Visit
Admission: RE | Admit: 2021-02-17 | Discharge: 2021-02-17 | Disposition: A | Discharge status: Home / Self Care | Payer: BC Managed Care – PPO | Source: Ambulatory Visit | Attending: Radiation Oncology | Admitting: Radiation Oncology

## 2021-02-17 ENCOUNTER — Telehealth: Payer: Self-pay | Admitting: Adult Health

## 2021-02-17 DIAGNOSIS — Z510 Encounter for antineoplastic radiation therapy: Secondary | ICD-10-CM | POA: Diagnosis not present

## 2021-02-17 DIAGNOSIS — C50412 Malignant neoplasm of upper-outer quadrant of left female breast: Secondary | ICD-10-CM | POA: Diagnosis not present

## 2021-02-17 DIAGNOSIS — Z170 Estrogen receptor positive status [ER+]: Secondary | ICD-10-CM | POA: Diagnosis not present

## 2021-02-17 NOTE — Telephone Encounter (Signed)
Scheduled appt per 5/5 sch msg. Pt aware.  

## 2021-02-18 ENCOUNTER — Encounter: Payer: Self-pay | Admitting: Radiation Oncology

## 2021-02-18 ENCOUNTER — Ambulatory Visit
Admission: RE | Admit: 2021-02-18 | Discharge: 2021-02-18 | Disposition: A | Discharge status: Home / Self Care | Payer: BC Managed Care – PPO | Source: Ambulatory Visit | Attending: Radiation Oncology | Admitting: Radiation Oncology

## 2021-02-18 ENCOUNTER — Other Ambulatory Visit: Payer: Self-pay

## 2021-02-18 DIAGNOSIS — Z170 Estrogen receptor positive status [ER+]: Secondary | ICD-10-CM | POA: Diagnosis not present

## 2021-02-18 DIAGNOSIS — Z510 Encounter for antineoplastic radiation therapy: Secondary | ICD-10-CM | POA: Diagnosis not present

## 2021-02-18 DIAGNOSIS — C50412 Malignant neoplasm of upper-outer quadrant of left female breast: Secondary | ICD-10-CM | POA: Diagnosis not present

## 2021-02-25 ENCOUNTER — Encounter: Payer: Self-pay | Admitting: *Deleted

## 2021-02-28 ENCOUNTER — Encounter: Payer: Self-pay | Admitting: Adult Health

## 2021-02-28 NOTE — Progress Notes (Signed)
  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  Intent: Curative  Radiation Treatment Dates: 01/24/2021 through 02/18/2021 Site Technique Total Dose (Gy) Dose per Fx (Gy) Completed Fx Beam Energies  Breast, Left: Breast_Lt 3D 42.56/42.56 2.66 16/16 6X  Breast, Left: Breast_Lt_Bst 3D 8/8 2 4/4 6X   Narrative: The patient tolerated radiation therapy relatively well. She developed anticipated skin changes and fatigue during treatment.  Plan: The patient will receive a call in about one month from the radiation oncology department. She will continue follow up with Dr. Lindi Adie as well.   ________________________________________________    Carola Rhine, [facility]

## 2021-03-01 ENCOUNTER — Telehealth: Payer: Self-pay | Admitting: Radiation Oncology

## 2021-03-01 NOTE — Telephone Encounter (Signed)
The patient sent messages requesting a referral to cardiology for a baseline assessment given her history of left sided breast cancer and her recent radiation treatment. I tried to call her back to discuss but had to leave a message.     Lisa Gallegos, PAC

## 2021-03-02 ENCOUNTER — Telehealth: Payer: Self-pay | Admitting: Radiation Oncology

## 2021-03-02 DIAGNOSIS — C50412 Malignant neoplasm of upper-outer quadrant of left female breast: Secondary | ICD-10-CM

## 2021-03-02 DIAGNOSIS — Z170 Estrogen receptor positive status [ER+]: Secondary | ICD-10-CM

## 2021-03-02 NOTE — Telephone Encounter (Signed)
I spoke with the patient after she requested a cardiology consultation. She does not have a known history of heart disease, but has high cholesterol. She is wanting to establish with cardiology for a baseline evaluation. While she is aware that routine referral to cardiology is not felt to be routine, and that her risk of heart disease from her prior radiotherapy is <1% in 15 years following treatment, she is interested and feels that her risk of heart disease is still of concern. I recommended she meet with the oncologic cardiologists. Referral is placed so she can meet with them.

## 2021-03-16 ENCOUNTER — Encounter: Payer: Self-pay | Admitting: Physical Therapy

## 2021-03-16 ENCOUNTER — Other Ambulatory Visit: Payer: Self-pay

## 2021-03-16 ENCOUNTER — Ambulatory Visit: Payer: BC Managed Care – PPO | Attending: Hematology and Oncology | Admitting: Physical Therapy

## 2021-03-16 DIAGNOSIS — M6281 Muscle weakness (generalized): Secondary | ICD-10-CM | POA: Diagnosis not present

## 2021-03-16 DIAGNOSIS — C50412 Malignant neoplasm of upper-outer quadrant of left female breast: Secondary | ICD-10-CM | POA: Insufficient documentation

## 2021-03-16 DIAGNOSIS — R278 Other lack of coordination: Secondary | ICD-10-CM | POA: Diagnosis not present

## 2021-03-16 DIAGNOSIS — Z170 Estrogen receptor positive status [ER+]: Secondary | ICD-10-CM | POA: Diagnosis not present

## 2021-03-16 NOTE — Patient Instructions (Addendum)
Moisturizers . They are used in the vagina to hydrate the mucous membrane that make up the vaginal canal. . Designed to keep a more normal acid balance (ph) . Once placed in the vagina, it will last between two to three days.  . Use 2-3 times per week at bedtime  . Ingredients to avoid is glycerin and fragrance, can increase chance of infection . Should not be used just before sex due to causing irritation . Most are gels administered either in a tampon-shaped applicator or as a vaginal suppository. They are non-hormonal.   Types of Moisturizers(internal use)  . Vitamin E vaginal suppositories- Whole foods, Amazon . Moist Again . Coconut oil- can break down condoms . Good clean love . Yes moisturizer- amazon . NeuEve Silk , NeuEve Silver for menopausal or over 65 (if have severe vaginal atrophy or cancer treatments use NeuEve Silk for  1 month than move to The Pepsi)- [employer], MapleFlower.dk . Olive and Bee intimate cream- www.oliveandbee.com.au . Mae vaginal Archer . Aloe .    Creams to use externally on the Vulva area    V-magic cream - amazon    Vital "V Wild Yam salve ( help moisturize and help with thinning vulvar area, does have [place]dale by Irwin Brakeman labial moisturizer ([place],   Coconut or olive oil  aloe   Things to avoid in the vaginal area . Do not use things to irritate the vulvar area . No lotions just specialized creams for the vulva area- Neogyn, V-magic, No soaps; can use Aveeno or Calendula cleanser, cetaphil  if needed. Must be gentle . No deodorants . No douches . Good to sleep without underwear to let the vaginal area to air out . No scrubbing: spread the lips to let warm water rinse over labias and pat dry    Lubrication . Used for intercourse to reduce friction . Avoid ones that have glycerin, warming gels, tingling gels, icing or cooling gel,  scented . Avoid parabens due to a preservative similar to female sex hormone . May need to be reapplied once or several times during sexual activity . Can be applied to both partners genitals prior to vaginal penetration to minimize friction or irritation . Prevent irritation and mucosal tears that cause post coital pain and increased the risk of vaginal and urinary tract infections . Oil-based lubricants cannot be used with condoms due to breaking them down.  Least likely to irritate vaginal tissue.  . Plant based-lubes are safe . Silicone-based lubrication are thicker and last long and used for post-menopausal women  Vaginal Lubricators Here is a list of some suggested lubricators you can use for intercourse. Use the most hypoallergenic product.  You can place on you or your partner.   Slippery Stuff ( water based)  Sylk or Sliquid Natural H2O ( good  if frequent UTI's)- walmart, amazon  Sliquid organics silk-(aloe and silicone based )  Bank [employer] (www.blossom-organics.com)- (aloe based )  Coconut oil, olive oil -not good with condoms   PJur Woman Nude- (water based) amazon  Uberlube- ( silicon) [place] has an organic one  Yes lubricant- (water based and has plant oil based similar to silicone) Campbell [employer] Platinum-Silicone, Target, Walgreens  Olive and Bee intimate cream-  www.oliveandbee.com.au  [place] Things to avoid in lubricants are glycerin, warming gels, tingling gels, icing  or cooling  gels, and scented gels.  Also avoid Vaseline. KY jelly, Replens, and Astroglide kills good bacteria(lactobacilli)  Things to avoid in the vaginal area . Do not use things to irritate the vulvar area . No lotions- see below . No soaps; can use Aveeno or Calendula cleanser if needed. Must be gentle . No deodorants . No douches . Good to sleep without underwear to let the vaginal area to air out . No  scrubbing: spread the lips to let warm water rinse over labias and pat dry  Creams that can be used on the Vulva Area  V Bank [employer], walmart  Vital V Wild Yam Salve  Julva- [employer] Botanical Pro-Meno Wild Yam Cream  Coconut oil, olive oil  Cleo by [employer] labial moisturizer -Amazon,   Desert Germania Releveum ( lidocaine) or Desert Conseco

## 2021-03-16 NOTE — Therapy (Signed)
[facility] [address], [place], [place], [address] Phone: [phone]   Fax:  [phone]  Physical Therapy Evaluation  Patient Details  Name: Lisa Gallegos MRN: [id number] Date of Birth: February 10, 1956 Referring Provider (PT): Nicholas Lose   Encounter Date: 03/16/2021   PT End of Session - 03/16/21 1204    Visit Number 1    Date for PT Re-Evaluation 07/16/21    Authorization Type BCBS    PT Start Time 0845    PT Stop Time 0925    PT Time Calculation (min) 40 min    Activity Tolerance Patient tolerated treatment well    Behavior During Therapy [facility] for tasks assessed/performed           Past Medical History:  Diagnosis Date  . Anxiety   . Cancer ([place]) 11/2020   left breast IDC  . Family history of pancreatic cancer 12/16/2020    Past Surgical History:  Procedure Laterality Date  . APPENDECTOMY    . BREAST LUMPECTOMY WITH RADIOACTIVE SEED AND AXILLARY LYMPH NODE DISSECTION Left 12/23/2020   Procedure: LEFT BREAST LUMPECTOMY WITH RADIOACTIVE SEED AND LEFT AXILLARY LYMPH NODE BIOPSY;  Surgeon: Rolm Bookbinder, MD;  Location: [place];  Service: General;  Laterality: Left;  PEC BLOCK; START TIME OF 11:30 AM FOR 60 MINUTES ROOM 2 [place] IQ  . CESAREAN SECTION  1985   FTP  . KIDNEY STONE SURGERY     laser surgery  . LAPAROSCOPIC APPENDECTOMY N/A 05/10/2018   Procedure: APPENDECTOMY LAPAROSCOPIC;  Surgeon: Excell Seltzer, MD;  Location: [place];  Service: General;  Laterality: N/A;    There were no vitals filed for this visit.    Subjective Assessment - 03/16/21 0851    Subjective Pt reports completing radiation 02/18/21 and reports only skin color changes, no burning. Pt started anti-estrogen medication today. Pt denies pain in pelvic area, does have vaginal dryness and worried about worsening. Pt reportes currently being sexual active and no pain with lubrication.    Pertinent History Patient  was diagnosed on 11/15/2020 with left grade II invasive ductal carcinoma breast cancer. Patient underwent a left lumpectomy and sentinel node biopsy (3 negative nodes) on 12/23/2020. It is ER/PR positive and HER2 negative with a Ki67 of 5%.    Patient Stated Goals to have less vaginal dryness    Currently in Pain? No/denies              [facility] PT Assessment - 03/16/21 0001      Assessment   Medical Diagnosis Malignant Lt breast cancer, estrogen receptor (+)    Referring Provider (PT) Nicholas Lose    Onset Date/Surgical Date 12/23/20   12/03/20 initial diagnosis; 01/25/21 started radiation   Prior Therapy x2 sessions in cancer rehab      Precautions   Precautions --   breast cancer with radiation     Restrictions   Weight Bearing Restrictions No      Balance Screen   Has the patient fallen in the past 6 months No    Has the patient had a decrease in activity level because of a fear of falling?  No    Is the patient reluctant to leave their home because of a fear of falling?  No      Home Ecologist residence      Prior Function   Level of Independence Independent    Vocation Full time employment    Leisure  starting [employer] 3x per week      Cognition   Overall Cognitive Status Within Functional Limits for tasks assessed      Observation/Other Assessments   Observations --      Posture/Postural Control   Posture/Postural Control Postural limitations    Postural Limitations Increased thoracic kyphosis   mild   Posture Comments full motion, no trendelenburg      ROM / Strength   AROM / PROM / Strength Strength;AROM;PROM      AROM   AROM Assessment Site Lumbar;Thoracic;Hip    Right/Left Shoulder Right;Left    Thoracic Flexion [facility]    Thoracic Extension [facility]    Thoracic - Right Side Bend [facility]    Thoracic - Left Side Bend [facility]    Thoracic - Right Rotation [facility]    Thoracic - Left Rotation WFL      PROM   Overall PROM  --    PROM Assessment  Site --      Strength   Overall Strength Comments 5/5 grossly in knees, ankles    Strength Assessment Site Hip    Right/Left Hip Left;Right    Right Hip ABduction 4/5    Right Hip ADduction 4/5    Left Hip ABduction 4/5    Left Hip ADduction 4/5      Palpation   SI assessment  Rt ilium anteriorly rotated, T8-T12 deecreased mobility                      Objective measurements completed on examination: See above findings.     Pelvic Floor Special Questions - 03/16/21 0001    Prior Pregnancies Yes    Number of Pregnancies 2    Number of C-Sections 1   slightly decreased mobility noted at C-section scar noted but no pain per pt   Number of Vaginal Deliveries 1    Any difficulty with labor and deliveries No    Episiotomy Performed No    Diastasis Recti no    Currently Sexually Active Yes    Is this Painful No   but does require lubrication   Urinary Leakage Yes    How often with full bladder when laughter    Activities that cause leaking Laughing    Fecal incontinence No    External Perineal Exam --   noted dryness   Skin Integrity Intact;Other    Skin Integrity other dryess    External Palpation some restrictions of the clitoral hood    Pelvic Floor Internal Exam Pt confirms idenification and approves of PT to assess pelvic floor and treat    Exam Type Vaginal    Palpation Rt and Lt at introitus, puborectalis on Rt tightness noted    Strength weak squeeze, no lift   globally           [facility] Adult PT Treatment/Exercise - 03/16/21 0001      Self-Care   Self-Care Other Self-Care Comments    Other Self-Care Comments  education on vaginal moisturizers                  PT Education - 03/16/21 0907    Education Details Pt educated on effects of decreased estrogen on pelvic floor with use of pelvic model for visual. Pt also educated on internal mobility at pelvic floor for improved mobility    Person(s) Educated Patient    Methods  Explanation;Demonstration    Comprehension Verbalized understanding  PT Short Term Goals - 03/16/21 1220      PT SHORT TERM GOAL #1   Title independent with initial HEP for pelvic floor strength    Time 4    Period Weeks    Status New    Target Date 04/13/21             PT Long Term Goals - 03/16/21 1220      PT LONG TERM GOAL #1   Title independent with HEP for pelvic floor and core strength    Time 4    Period Months    Status New    Target Date 07/16/21      PT LONG TERM GOAL #2   Title vaginal dryness reduced >/= 75% due to using moisturizers to the perineal area and good vaginal health    Time 4    Period Months    Status New    Target Date 07/16/21      PT LONG TERM GOAL #3   Title able to laugh with a full bladder and not leak urine due to increased pelvic floor strength >/= 3/5 holding for 10 seconds    Time 4    Period Months    Status New    Target Date 07/16/21                  Plan - 03/16/21 0856    Clinical Impression Statement Patient is a 65 year old female with vaginal dryness since she was diagnosed with breast cancer estrogen receptor positive on 12/03/2020. Patient had radiation on 01/25/21 to 02/18/2021. Patient has started Anastrozole. Patient has pain with penile penetration without lubricant. Patient bilateral hip strength is 4/5. Right ilium i srotated anteriorly. Pelvic floor strength 2/5 with circle contraction. Patient has tightness in the right and left introitus and puborectalis. Patient has dryness on the outside of the perineum. The clitoral hood has some restrictions. Patient will leak urine when she laughs with a full bladder. Patient will benefit from skilled therapy to improve pelvic floor coordination and reduce dryness.    Personal Factors and Comorbidities Sex    Examination-Activity Limitations Continence    Examination-Participation Restrictions Interpersonal Relationship    Stability/Clinical Decision Making  Evolving/Moderate complexity    Clinical Decision Making Low    Rehab Potential Good    PT Frequency 1x / week   every other week   PT Duration Other (comment)   4 months   PT Treatment/Interventions ADLs/Self Care Home Management;Biofeedback;Therapeutic activities;Therapeutic exercise;Neuromuscular re-education;Patient/family education;Manual techniques;Dry needling    PT Next Visit Plan manual work to the pelvic floor on the introitus, review lubrincats and see how to moisturizers are going, educaton on manual work to the pelvic floor, pelvic floor strength exercises    PT Home Exercise Plan --    Consulted and Agree with Plan of Care Patient           Patient will benefit from skilled therapeutic intervention in order to improve the following deficits and impairments:  Decreased coordination,Increased fascial restricitons,Decreased endurance,Decreased activity tolerance,Decreased strength  Visit Diagnosis: Muscle weakness (generalized) - Plan: PT plan of care cert/re-cert  Other lack of coordination - Plan: PT plan of care cert/re-cert  Malignant neoplasm of upper-outer quadrant of left breast in female, estrogen receptor positive ([place]) - Plan: PT plan of care cert/re-cert     Problem List Patient Active Problem List   Diagnosis Date Noted  . Family history of pancreatic cancer 12/16/2020  . Genetic  testing 12/16/2020  . Malignant neoplasm of upper-outer quadrant of left breast in female, estrogen receptor positive ([place]) 12/03/2020  . Appendicitis 05/10/2018  . Situational anxiety 04/05/2018  . Vitamin D deficiency 04/05/2018  . Routine physical examination 12/27/2016  . Menopausal symptoms 03/24/2015  . Hyperlipidemia 03/24/2015  . Seborrheic keratoses 03/24/2015  . Weight gain 09/15/2014  . Elevated BP 09/15/2014    Earlie Counts, PT 03/16/21 12:25 PM   [facility] [address], [place], [place],  [address] Phone: [phone]   Fax:  [phone]  Name: Lisa Gallegos MRN: [id number] Date of Birth: 1956-07-01

## 2021-03-17 ENCOUNTER — Ambulatory Visit (HOSPITAL_BASED_OUTPATIENT_CLINIC_OR_DEPARTMENT_OTHER)
Admission: RE | Admit: 2021-03-17 | Discharge: 2021-03-17 | Disposition: A | Discharge status: Home / Self Care | Payer: BC Managed Care – PPO | Source: Ambulatory Visit | Attending: Hematology and Oncology | Admitting: Hematology and Oncology

## 2021-03-17 ENCOUNTER — Telehealth: Payer: Self-pay | Admitting: Hematology and Oncology

## 2021-03-17 DIAGNOSIS — Z780 Asymptomatic menopausal state: Secondary | ICD-10-CM | POA: Diagnosis not present

## 2021-03-17 NOTE — Telephone Encounter (Signed)
I informed her that the bone density showed a T score of -0.5 which is normal.

## 2021-03-28 ENCOUNTER — Ambulatory Visit: Payer: BC Managed Care – PPO | Attending: Hematology and Oncology | Admitting: Physical Therapy

## 2021-03-28 ENCOUNTER — Other Ambulatory Visit: Payer: Self-pay

## 2021-03-28 DIAGNOSIS — Z483 Aftercare following surgery for neoplasm: Secondary | ICD-10-CM | POA: Insufficient documentation

## 2021-03-28 NOTE — Therapy (Signed)
Lisa Gallegos, [place], [address] Phone: [phone]   Fax:  [phone]  Physical Therapy Treatment  Patient Details  Name: Lisa Gallegos MRN: [id number] Date of Birth: 05-27-1956 Referring Provider (PT): Lisa Gallegos   Encounter Date: 03/28/2021   PT End of Session - 03/28/21 1629     Visit Number 1   unchanged due to screen   PT Start Time 2376    PT Stop Time 2831    PT Time Calculation (min) 5 min             Past Medical History:  Diagnosis Date   Anxiety    Cancer ([place]) 11/2020   left breast IDC   Family history of pancreatic cancer 12/16/2020    Past Surgical History:  Procedure Laterality Date   APPENDECTOMY     BREAST LUMPECTOMY WITH RADIOACTIVE SEED AND AXILLARY LYMPH NODE DISSECTION Left 12/23/2020   Procedure: LEFT BREAST LUMPECTOMY WITH RADIOACTIVE SEED AND LEFT AXILLARY LYMPH NODE BIOPSY;  Surgeon: Lisa Bookbinder, MD;  Location: [facility];  Service: General;  Laterality: Left;  PEC BLOCK; START TIME OF 11:30 AM FOR 60 MINUTES ROOM 2 [place] [facility]   [place]     laser surgery   LAPAROSCOPIC APPENDECTOMY N/A 05/10/2018   Procedure: APPENDECTOMY LAPAROSCOPIC;  Surgeon: Lisa Seltzer, MD;  Location: [facility];  Service: General;  Laterality: N/A;    There were no vitals filed for this visit.   Subjective Assessment - 03/28/21 1628     Subjective Pt is here for SOZO screen    Pertinent History Patient was diagnosed on 11/15/2020 with left grade II invasive ductal carcinoma breast cancer. Patient underwent a left lumpectomy and sentinel node biopsy (3 negative nodes) on 12/23/2020. It is ER/PR positive and HER2 negative with a Ki67 of 5%.                    L-DEX FLOWSHEETS - 03/28/21 1600       L-DEX LYMPHEDEMA SCREENING   Measurement Type Unilateral    L-DEX MEASUREMENT EXTREMITY Upper Extremity     DOMINANT SIDE Right    At Risk Side Left    BASELINE SCORE (UNILATERAL) 2.2    L-DEX SCORE (UNILATERAL) 3.1                                 PT Short Term Goals - 03/16/21 1220       PT SHORT TERM GOAL #1   Title independent with initial HEP for pelvic floor strength    Time 4    Period Weeks    Status New    Target Date 04/13/21               PT Long Term Goals - 03/16/21 1220       PT LONG TERM GOAL #1   Title independent with HEP for pelvic floor and core strength    Time 4    Period Months    Status New    Target Date 07/16/21      PT LONG TERM GOAL #2   Title vaginal dryness reduced >/= 75% due to using moisturizers to the perineal area and good vaginal health    Time 4    Period Months    Status New    Target Date 07/16/21  PT LONG TERM GOAL #3   Title able to laugh with a full bladder and not leak urine due to increased pelvic floor strength >/= 3/5 holding for 10 seconds    Time 4    Period Months    Status New    Target Date 07/16/21                   Plan - 03/28/21 1634     Clinical Impression Statement Pt comes for SOZO check.  Her change from baseline in 0.9 which is in the recommended range.    PT Next Visit Plan manual work to the pelvic floor on the introitus, review lubrincats and see how to moisturizers are going, educaton on manual work to the pelvic floor, pelvic floor strength exercises continue with 3 month SOZO screens             Patient will benefit from skilled therapeutic intervention in order to improve the following deficits and impairments:     Visit Diagnosis: Aftercare following surgery for neoplasm     Problem List Patient Active Problem List   Diagnosis Date Noted   Family history of pancreatic cancer 12/16/2020   Genetic testing 12/16/2020   Malignant neoplasm of upper-outer quadrant of left breast in female, estrogen receptor positive ([place]) 12/03/2020   Appendicitis  05/10/2018   Situational anxiety 04/05/2018   Vitamin D deficiency 04/05/2018   Routine physical examination 12/27/2016   Menopausal symptoms 03/24/2015   Hyperlipidemia 03/24/2015   Seborrheic keratoses 03/24/2015   Weight gain 09/15/2014   Elevated BP 09/15/2014   Lisa Gallegos PT  Lisa Gallegos 03/28/2021, 4:36 PM  Lisa Gallegos, [place], [address] Phone: [phone]   Fax:  [phone]  Name: Lisa Gallegos MRN: [id number] Date of Birth: [birth year]

## 2021-03-29 ENCOUNTER — Encounter: Payer: BC Managed Care – PPO | Admitting: Physical Therapy

## 2021-03-31 NOTE — Progress Notes (Signed)
  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  Interval Since Last Radiation:  7 weeks   01/24/2021 through 02/18/2021 Site Technique Total Dose (Gy) Dose per Fx (Gy) Completed Fx Beam Energies  Breast, Left: Breast_Lt 3D 42.56/42.56 2.66 16/16 6X  Breast, Left: Breast_Lt_Bst 3D 8/8 2 4/4 6X    Narrative:  The patient was contacted today for routine follow-up. During treatment she did very well with radiotherapy and did not have significant desquamation. She reports she is doing well. She does have some persistent discoloration of her skin. She's used eucerin skin spray at times, but for the most part is not using any regular skin products.   Impression/Plan: 1. 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 The patient has been doing well since completion of radiotherapy. We discussed that we would be happy to continue to follow her as needed, but she will also continue to follow up with Dr. Lindi Adie in medical oncology. She was counseled on skin care as well as measures to avoid sun exposure to this area. She can also try a mild exfoliating approach with a loofah or non chemical scrub. She was encouraged to call back and let us know if she has other concerns, but was also advised to use her judgement and if exfoliating is uncomfortable to discontinue this. 2. Survivorship. We discussed the importance of survivorship evaluation and encouraged her to attend her upcoming visit with that clinic. 3. Concerns for risks of heart disease given radiation exposure for #1. She is meeting with cardiology next month. Please see other notes. She is aware her risks of cardiac disease from radiation exposure should be less than 1% chance 15 or more years  following treatment.      Carola Rhine, PAC

## 2021-04-04 ENCOUNTER — Ambulatory Visit
Admission: RE | Admit: 2021-04-04 | Discharge: 2021-04-04 | Disposition: A | Discharge status: Home / Self Care | Payer: BC Managed Care – PPO | Source: Ambulatory Visit | Attending: Radiation Oncology | Admitting: Radiation Oncology

## 2021-04-04 ENCOUNTER — Other Ambulatory Visit: Payer: Self-pay

## 2021-04-04 DIAGNOSIS — C50412 Malignant neoplasm of upper-outer quadrant of left female breast: Secondary | ICD-10-CM | POA: Insufficient documentation

## 2021-04-04 DIAGNOSIS — Z170 Estrogen receptor positive status [ER+]: Secondary | ICD-10-CM

## 2021-04-19 NOTE — Progress Notes (Signed)
Cardiology Office Note:    Date:  04/21/2021  NAME:  Lisa Gallegos    MRN: [id number] DOB:  December 12, 1955   PCP:  Burnard Hawthorne, FNP  Cardiologist:  None  Electrophysiologist:  None   Referring MD: Hayden Pedro,*   Chief Complaint  Patient presents with   Hyperlipidemia   History of Present Illness:    Lisa Gallegos is a 65 y.o. female with a hx of breast cancer who is being seen today for the evaluation of HLD at the request of Hayden Pedro,*.  She recently underwent treatment for stage I breast cancer.  Seems to be doing well.  She is on anastrozole.  Medical history significant for hyperlipidemia.  BP is slightly elevated today 162/88.  No diagnosis of hypertension.  She does report that she had had a root canal recently.  She reports poor sleep last night.  She reports that she has no other medical problems.  No history of heart attack or stroke.  She denies any symptoms of chest pain or shortness of breath.  She reports she exercises 3 times per week.  This includes walking up to 1 mile.  No limitations with this.  She also does weight training.  No issues.  She is a never smoker.  She does not drink alcohol.  No drug use is reported.  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  Her EKG in office demonstrates normal sinus rhythm with no acute ischemic changes or evidence of infarction.  She overall is fairly low risk.  She received no detrimental chemotherapeutic agents.  Her father did have heart disease but lived to be 19.  No history of heart disease in her siblings.  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 HLD -Total cholesterol 255, HDL 61, LDL 176, triglycerides 93  Past Medical History: Past Medical History:  Diagnosis Date   Anxiety     Cancer ([place]) 11/2020   left breast IDC   Family history of pancreatic cancer 12/16/2020    Past Surgical History: Past Surgical History:  Procedure Laterality Date   APPENDECTOMY     BREAST LUMPECTOMY WITH RADIOACTIVE SEED AND AXILLARY LYMPH NODE DISSECTION Left 12/23/2020   Procedure: LEFT BREAST LUMPECTOMY WITH RADIOACTIVE SEED AND LEFT AXILLARY LYMPH NODE BIOPSY;  Surgeon: Rolm Bookbinder, MD;  Location: [facility];  Service: General;  Laterality: Left;  PEC BLOCK; START TIME OF 11:30 AM FOR 60 MINUTES ROOM 2 [facility] [facility]   [facility]     laser surgery   LAPAROSCOPIC APPENDECTOMY N/A 05/10/2018   Procedure: APPENDECTOMY LAPAROSCOPIC;  Surgeon: Excell Seltzer, MD;  Location: [facility];  Service: General;  Laterality: N/A;    Current Medications: Current Meds  Medication Sig   anastrozole (ARIMIDEX) 1 MG tablet Take 1 tablet (1 mg total) by mouth daily.     Allergies:    Patient has no known allergies.   Social History: Social History   Socioeconomic History   Marital status: Single    Spouse name: Not on file   Number of children: 2   Years of education: Not on file   Highest education level: Not on file  Occupational History   Not on file  Tobacco Use   Smoking  status: Never   Smokeless tobacco: Never  Substance and Sexual Activity   Alcohol use: Not Currently    Alcohol/week: 0.0 standard drinks   Drug use: No   Sexual activity: Yes    Birth control/protection: Post-menopausal  Other Topics Concern   Not on file  Social History Narrative   Lives in [place] with fiance. 2 dogs       Work - Personal assistant.      Diet - chicken and fish with veggies, avoids red meat. Vegetarian to bring cholesterol down as doesn't want to be treated with cholesterol medications if she can avoid it.       Social Determinants of Health   Financial Resource Strain: Low Risk    Difficulty of  Paying Living Expenses: Not hard at all  Food Insecurity: No Food Insecurity   Worried About Charity fundraiser in the Last Year: Never true   [place] in the Last Year: Never true  Transportation Needs: No Transportation Needs   Lack of Transportation (Medical): No   Lack of Transportation (Non-Medical): No  Physical Activity: Not on file  Stress: Not on file  Social Connections: Not on file     Family History: The patient's family history includes Heart disease in her father; Lung cancer in her maternal uncle; Pancreatic cancer (age of onset: 18) in her mother. There is no history of Breast cancer.  ROS:   All other ROS reviewed and negative. Pertinent positives noted in the HPI.    EKGs/Labs/Other Studies Reviewed:   The following studies were personally reviewed by me today:  EKG:  EKG is ordered today.  The ekg ordered today demonstrates normal sinus rhythm heart rate 70, no acute ischemic changes, no evidence of prior infarction, and was personally reviewed by me.   Recent Labs: 12/15/2020: ALT 36; BUN 18; Creatinine 0.88; Hemoglobin 13.6; Platelet Count 267; Potassium 4.5; Sodium 141   Recent Lipid Panel    Component Value Date/Time   CHOL 255 (H) 04/05/2018 0904   TRIG 93.0 04/05/2018 0904   HDL 61.00 04/05/2018 0904   CHOLHDL 4 04/05/2018 0904   VLDL 18.6 04/05/2018 0904   LDLCALC 176 (H) 04/05/2018 0904    Physical Exam:   VS:  BP (!) 162/88   Pulse 70   Ht _0  (1.651 m)   Wt 177 lb (80.3 kg)   LMP 02/17/2012   SpO2 99%   BMI 29.45 kg/m    Wt Readings from Last 3 Encounters:  04/21/21 177 lb (80.3 kg)  02/14/21 172 lb 9.6 oz (78.3 kg)  01/13/21 173 lb 3 oz (78.6 kg)    General: Well nourished, well developed, in no acute distress Head: Atraumatic, normal size  Eyes: PEERLA, EOMI  Neck: Supple, no JVD Endocrine: No thryomegaly Cardiac: Normal S1, S2; RRR; no murmurs, rubs, or gallops Lungs: Clear to auscultation bilaterally, no wheezing,  rhonchi or rales  Abd: Soft, nontender, no hepatomegaly  Ext: No edema, pulses 2+ Musculoskeletal: No deformities, BUE and BLE strength normal and equal Skin: Warm and dry, no rashes   Neuro: Alert and oriented to person, place, time, and situation, CNII-XII grossly intact, no focal deficits  Psych: Normal mood and affect   ASSESSMENT:   Sonita Michiels is a 65 y.o. female who presents for the following: 1. Mixed hyperlipidemia     PLAN:   1. Mixed hyperlipidemia -Recent diagnosis of stage I breast cancer.  Did not receive any detrimental chemotherapeutic  agents.  She underwent lumpectomy and radiation.  She had targeted radiation therapy.  She had several questions about her cardiovascular risk after undergoing radiation therapy.  I did inform her that radiation therapy is quite different than it was in the 1970s.  She likely will not have any detrimental effects or increased risk of coronary disease.  She received no chemotherapeutic agents that have detriment to the heart.  She is on adjuvant antiestrogen therapy for 5 years with anastrozole.  She is tolerating this well. -Her most recent LDL cholesterol was 176.  Her EKG is normal.  She has no symptoms of underlying coronary disease.  Overall I feel she is low risk.  I have recommended coronary calcium scoring for further risk assessment.  She may consider low-dose statin therapy.  We will follow-up the results of this and make recommendations based on that. -She has basically a vegetarian.  She eats fish.  Her diet is immaculate.  She is exercising well.  She is overall getting targets for a heart healthy lifestyle. -Father had heart disease but lived to be 73.  I suspect this is not a strong family history of heart disease. -For now I have recommended continued lifestyle modification.  We will obtain a coronary calcium score and make recommendations on statin therapy based on this.  Disposition: Return if symptoms worsen or fail to  improve.  Medication Adjustments/Labs and Tests Ordered: Current medicines are reviewed at length with the patient today.  Concerns regarding medicines are outlined above.  Orders Placed This Encounter  Procedures   CT CARDIAC SCORING (SELF PAY ONLY)   EKG 12-Lead    No orders of the defined types were placed in this encounter.   Patient Instructions  Medication Instructions:  The current medical regimen is effective;  continue present plan and medications.  *If you need a refill on your cardiac medications before your next appointment, please call your pharmacy*   Testing/Procedures: CALCIUM SCORE    Follow-Up: At [facility], you and your health needs are our priority.  As part of our continuing mission to provide you with exceptional heart care, we have created designated Provider Care Teams.  These Care Teams include your primary Cardiologist (physician) and Advanced Practice Providers (APPs -  Physician Assistants and Nurse Practitioners) who all work together to provide you with the care you need, when you need it.  We recommend signing up for the patient portal called "MyChart".  Sign up information is provided on this After Visit Summary.  MyChart is used to connect with patients for Virtual Visits (Telemedicine).  Patients are able to view lab/test results, encounter notes, upcoming appointments, etc.  Non-urgent messages can be sent to your provider as well.   To learn more about what you can do with MyChart, go to [email or web address].    Your next appointment:   As needed  The format for your next appointment:   In Person  Provider:   Eleonore Chiquito, MD       Signed, Addison Naegeli. Audie Box, MD, [place]  [address] [place], [place] [address] [phone]  04/21/2021 9:03 AM

## 2021-04-21 ENCOUNTER — Other Ambulatory Visit: Payer: Self-pay

## 2021-04-21 ENCOUNTER — Ambulatory Visit (INDEPENDENT_AMBULATORY_CARE_PROVIDER_SITE_OTHER): Payer: BC Managed Care – PPO | Admitting: Cardiovascular Disease

## 2021-04-21 ENCOUNTER — Encounter (HOSPITAL_BASED_OUTPATIENT_CLINIC_OR_DEPARTMENT_OTHER): Payer: Self-pay | Admitting: Cardiovascular Disease

## 2021-04-21 VITALS — BP 162/88 | HR 70 | Ht 65.0 in | Wt 177.0 lb

## 2021-04-21 DIAGNOSIS — E782 Mixed hyperlipidemia: Secondary | ICD-10-CM | POA: Diagnosis not present

## 2021-04-21 NOTE — Patient Instructions (Signed)
Medication Instructions:  The current medical regimen is effective;  continue present plan and medications.  *If you need a refill on your cardiac medications before your next appointment, please call your pharmacy*   Testing/Procedures: CALCIUM SCORE   Follow-Up: At [facility], you and your health needs are our priority.  As part of our continuing mission to provide you with exceptional heart care, we have created designated Provider Care Teams.  These Care Teams include your primary Cardiologist (physician) and Advanced Practice Providers (APPs -  Physician Assistants and Nurse Practitioners) who all work together to provide you with the care you need, when you need it.  We recommend signing up for the patient portal called "MyChart".  Sign up information is provided on this After Visit Summary.  MyChart is used to connect with patients for Virtual Visits (Telemedicine).  Patients are able to view lab/test results, encounter notes, upcoming appointments, etc.  Non-urgent messages can be sent to your provider as well.   To learn more about what you can do with MyChart, go to [email or web address].    Your next appointment:   As needed  The format for your next appointment:   In Person  Provider:   [place] O'Neal, MD     

## 2021-04-28 ENCOUNTER — Other Ambulatory Visit: Payer: Self-pay

## 2021-04-28 ENCOUNTER — Ambulatory Visit (HOSPITAL_BASED_OUTPATIENT_CLINIC_OR_DEPARTMENT_OTHER)
Admission: RE | Admit: 2021-04-28 | Discharge: 2021-04-28 | Disposition: A | Discharge status: Home / Self Care | Payer: BC Managed Care – PPO | Source: Ambulatory Visit | Attending: Cardiovascular Disease | Admitting: Cardiovascular Disease

## 2021-04-28 DIAGNOSIS — E785 Hyperlipidemia, unspecified: Secondary | ICD-10-CM | POA: Insufficient documentation

## 2021-04-28 DIAGNOSIS — Z136 Encounter for screening for cardiovascular disorders: Secondary | ICD-10-CM | POA: Insufficient documentation

## 2021-04-29 ENCOUNTER — Encounter (HOSPITAL_BASED_OUTPATIENT_CLINIC_OR_DEPARTMENT_OTHER): Payer: Self-pay

## 2021-05-06 ENCOUNTER — Other Ambulatory Visit: Payer: Self-pay

## 2021-05-06 ENCOUNTER — Ambulatory Visit: Payer: BC Managed Care – PPO | Attending: Hematology and Oncology | Admitting: Physical Therapy

## 2021-05-06 ENCOUNTER — Encounter: Payer: Self-pay | Admitting: Physical Therapy

## 2021-05-06 DIAGNOSIS — R278 Other lack of coordination: Secondary | ICD-10-CM | POA: Diagnosis not present

## 2021-05-06 DIAGNOSIS — M6281 Muscle weakness (generalized): Secondary | ICD-10-CM | POA: Insufficient documentation

## 2021-05-06 NOTE — Therapy (Signed)
[facility] [address], [place], [place], [address] Phone: [phone]   Fax:  [phone]  Physical Therapy Treatment  Patient Details  Name: Lisa Gallegos MRN: [id number] Date of Birth: September 09, 1956 Referring Provider (PT): Nicholas Lose   Encounter Date: 05/06/2021   PT End of Session - 05/06/21 0855     Visit Number 2    Date for PT Re-Evaluation 07/16/21    Authorization Type BCBS    Authorization - Number of Visits 30    PT Start Time 0845    PT Stop Time 0925    PT Time Calculation (min) 40 min    Activity Tolerance Patient tolerated treatment well    Behavior During Therapy [facility] for tasks assessed/performed             Past Medical History:  Diagnosis Date   Anxiety    Cancer ([place]) 11/2020   left breast IDC   Family history of pancreatic cancer 12/16/2020    Past Surgical History:  Procedure Laterality Date   APPENDECTOMY     BREAST LUMPECTOMY WITH RADIOACTIVE SEED AND AXILLARY LYMPH NODE DISSECTION Left 12/23/2020   Procedure: LEFT BREAST LUMPECTOMY WITH RADIOACTIVE SEED AND LEFT AXILLARY LYMPH NODE BIOPSY;  Surgeon: Rolm Bookbinder, MD;  Location: [facility];  Service: General;  Laterality: Left;  PEC BLOCK; START TIME OF 11:30 AM FOR 60 MINUTES ROOM 2 [place] IQ   [place]     laser surgery   LAPAROSCOPIC APPENDECTOMY N/A 05/10/2018   Procedure: APPENDECTOMY LAPAROSCOPIC;  Surgeon: Excell Seltzer, MD;  Location: [place];  Service: General;  Laterality: N/A;    There were no vitals filed for this visit.   Subjective Assessment - 05/06/21 0846     Subjective I stopped using dial soap and using coconut oil and notice a difference.  I have been on the Anastrozole and no side effects.    Pertinent History Patient was diagnosed on 11/15/2020 with left grade II invasive ductal carcinoma breast cancer. Patient underwent a left lumpectomy  and sentinel node biopsy (3 negative nodes) on 12/23/2020. It is ER/PR positive and HER2 negative with a Ki67 of 5%.    Patient Stated Goals to have less vaginal dryness    Currently in Pain? No/denies                            Pelvic Floor Special Questions - 05/06/21 0001     Pelvic Floor Internal Exam Pt confirms idenification and approves of PT to assess pelvic floor and treat    Exam Type Vaginal               [facility] Adult PT Treatment/Exercise - 05/06/21 0001       Self-Care   Self-Care Other Self-Care Comments    Other Self-Care Comments  educated patient on vaginal lubricants and looked over [place] ingredients andis appropriate to use      Manual Therapy   Manual Therapy Internal Pelvic Floor    Manual therapy comments educated patient on how to perform perineal massage to elongate the tissue    Internal Pelvic Floor manual work tot he vaginal canal posteriorly, work on the [employer], along the [employer]                    PT Education - 05/06/21 [phone]  Education Details reviewed information on vaginal lubricants; educated patient on vaginal massage    Person(s) Educated Patient    Methods Explanation;Demonstration    Comprehension Verbalized understanding;Returned demonstration              PT Short Term Goals - 03/16/21 1220       PT SHORT TERM GOAL #1   Title independent with initial HEP for pelvic floor strength    Time 4    Period Weeks    Status New    Target Date 04/13/21               PT Long Term Goals - 03/16/21 1220       PT LONG TERM GOAL #1   Title independent with HEP for pelvic floor and core strength    Time 4    Period Months    Status New    Target Date 07/16/21      PT LONG TERM GOAL #2   Title vaginal dryness reduced >/= 75% due to using moisturizers to the perineal area and good vaginal health    Time 4    Period Months    Status New    Target Date 07/16/21      PT LONG TERM  GOAL #3   Title able to laugh with a full bladder and not leak urine due to increased pelvic floor strength >/= 3/5 holding for 10 seconds    Time 4    Period Months    Status New    Target Date 07/16/21                   Plan - 05/06/21 0925     Clinical Impression Statement Patient is using vaginal moisturizers and stopped using her dial soap. This has helped her vaginal dryness tremedously. Patient continues to have vaginal tightness and she understands on how to perfrom her own tissue work. Patient will benefit from skilled therapy to improve tissue elongation and pelvic floor coordination.    Personal Factors and Comorbidities Sex    Examination-Activity Limitations Continence    Examination-Participation Restrictions Interpersonal Relationship    Stability/Clinical Decision Making Evolving/Moderate complexity    Rehab Potential Good    PT Frequency 1x / week   every other week   PT Duration Other (comment)   4 months   PT Treatment/Interventions ADLs/Self Care Home Management;Biofeedback;Therapeutic activities;Therapeutic exercise;Neuromuscular re-education;Patient/family education;Manual techniques;Dry needling    PT Next Visit Plan continue with manual work to the vaginal area; bulging of the pelvic floor; correct ilium, hip abduction/adduction strength    Recommended Other Services Md signed initial eval    Consulted and Agree with Plan of Care Patient             Patient will benefit from skilled therapeutic intervention in order to improve the following deficits and impairments:  Decreased coordination, Increased fascial restricitons, Decreased endurance, Decreased activity tolerance, Decreased strength  Visit Diagnosis: Muscle weakness (generalized)  Other lack of coordination     Problem List Patient Active Problem List   Diagnosis Date Noted   Family history of pancreatic cancer 12/16/2020   Genetic testing 12/16/2020   Malignant neoplasm of  upper-outer quadrant of left breast in female, estrogen receptor positive ([place]) 12/03/2020   Appendicitis 05/10/2018   Situational anxiety 04/05/2018   Vitamin D deficiency 04/05/2018   Routine physical examination 12/27/2016   Menopausal symptoms 03/24/2015   Hyperlipidemia 03/24/2015   Seborrheic keratoses 03/24/2015   Weight gain  09/15/2014   Elevated BP 09/15/2014    Earlie Counts, PT 05/06/21 9:30 AM   [facility] [address] [place], [place], [address] Phone: [phone]   Fax:  [phone]  Name: Alle Difabio MRN: [id number] Date of Birth: [birth year]

## 2021-05-19 ENCOUNTER — Other Ambulatory Visit: Payer: Self-pay

## 2021-05-19 ENCOUNTER — Ambulatory Visit: Payer: BC Managed Care – PPO | Attending: Hematology and Oncology | Admitting: Physical Therapy

## 2021-05-19 ENCOUNTER — Encounter: Payer: Self-pay | Admitting: Physical Therapy

## 2021-05-19 DIAGNOSIS — R278 Other lack of coordination: Secondary | ICD-10-CM | POA: Diagnosis not present

## 2021-05-19 DIAGNOSIS — M6281 Muscle weakness (generalized): Secondary | ICD-10-CM | POA: Diagnosis not present

## 2021-05-19 DIAGNOSIS — Z483 Aftercare following surgery for neoplasm: Secondary | ICD-10-CM | POA: Diagnosis not present

## 2021-05-19 NOTE — Therapy (Signed)
[facility] [address] [place], [place], [address] Phone: [phone]   Fax:  [phone]  Physical Therapy Treatment  Patient Details  Name: Lisa Gallegos MRN: [id number] Date of Birth: Apr 01, 1956 Referring Provider (PT): Nicholas Lose   Encounter Date: 05/19/2021   PT End of Session - 05/19/21 0856     Visit Number 3    Date for PT Re-Evaluation 07/16/21    Authorization Type BC[employer]    Authorization - Visit Number 6   total with pelvic floor and lymphadema   Authorization - Number of Visits 30    PT Start Time 0800    PT Stop Time 0842    PT Time Calculation (min) 42 min    Activity Tolerance Patient tolerated treatment well    Behavior During Therapy [facility] for tasks assessed/performed             Past Medical History:  Diagnosis Date   Anxiety    Cancer ([place]) 11/2020   left breast IDC   Family history of pancreatic cancer 12/16/2020    Past Surgical History:  Procedure Laterality Date   APPENDECTOMY     BREAST LUMPECTOMY WITH RADIOACTIVE SEED AND AXILLARY LYMPH NODE DISSECTION Left 12/23/2020   Procedure: LEFT BREAST LUMPECTOMY WITH RADIOACTIVE SEED AND LEFT AXILLARY LYMPH NODE BIOPSY;  Surgeon: Rolm Bookbinder, MD;  Location: [place];  Service: General;  Laterality: Left;  PEC BLOCK; START TIME OF 11:30 AM FOR 60 MINUTES ROOM 2 [place] IQ   [place] surgery   LAPAROSCOPIC APPENDECTOMY N/A 05/10/2018   Procedure: APPENDECTOMY LAPAROSCOPIC;  Surgeon: Excell Seltzer, MD;  Location: [place];  Service: General;  Laterality: N/A;    There were no vitals filed for this visit.                   Pelvic Floor Special Questions - 05/19/21 0001     Pelvic Floor Internal Exam Pt confirms idenification and approves of PT to assess pelvic floor and treat    Exam Type Vaginal    Strength good squeeze, good lift, able to  hold agaisnt strong resistance               [facility] Adult PT Treatment/Exercise - 05/19/21 0001       Self-Care   Self-Care Other Self-Care Comments    Other Self-Care Comments  discussed different supplements she was interested and gave her some referrals to get more information      Lumbar Exercises: Supine   Bent Knee Raise 10 reps   placed a towel under the lower rib cage   Bent Knee Raise Limitations contract the pelvic floor, only lift legs half way, tactile cues to contract the abdominals and not lift the upper rib cage; 10x each side    Other Supine Lumbar Exercises hookly with ball squeeze; alternate shoulder flexion with not lifting the rib cage, contract the pelvic floor 10x each arm      Manual Therapy   Manual Therapy Internal Pelvic Floor    Internal Pelvic Floor to the introitus, along the perineal body, posterior vaginal wall, and levator ani                    PT Education - 05/19/21 0852     Education Details Access Code: [email or web address]    Person(s) Educated Patient    Methods  Explanation;Demonstration;Verbal cues;Handout    Comprehension Returned demonstration;Verbalized understanding              PT Short Term Goals - 05/19/21 0813       PT SHORT TERM GOAL #1   Title independent with initial HEP for pelvic floor strength    Time 4    Period Weeks    Status Achieved               PT Long Term Goals - 03/16/21 1220       PT LONG TERM GOAL #1   Title independent with HEP for pelvic floor and core strength    Time 4    Period Months    Status New    Target Date 07/16/21      PT LONG TERM GOAL #2   Title vaginal dryness reduced >/= 75% due to using moisturizers to the perineal area and good vaginal health    Time 4    Period Months    Status New    Target Date 07/16/21      PT LONG TERM GOAL #3   Title able to laugh with a full bladder and not leak urine due to increased pelvic floor strength >/= 3/5 holding for 10 seconds     Time 4    Period Months    Status New    Target Date 07/16/21                   Plan - 05/19/21 0857     Clinical Impression Statement Patient has improved moisture of the vulvar area due to using her creams. She has good pink coloring of the vulva area. Pelvic floor strength has increased to 4/5 from 2/5. She is needing tactile cues to contract the abdominals with exercises. Patient is not leaking urine. She reports her vaginal area feels different in a good way. Patient will benefit from skilled therapy to improve pelvic floor and core strength.    Personal Factors and Comorbidities Sex    Examination-Activity Limitations Continence    Examination-Participation Restrictions Interpersonal Relationship    Stability/Clinical Decision Making Evolving/Moderate complexity    Rehab Potential Good    PT Frequency 1x / week   every other week   PT Duration Other (comment)   4 months   PT Treatment/Interventions ADLs/Self Care Home Management;Biofeedback;Therapeutic activities;Therapeutic exercise;Neuromuscular re-education;Patient/family education;Manual techniques;Dry needling    PT Next Visit Plan progress abdominal exercises with quadruped, modified plank, dead bug, hip abduction/adduction strength; possible discharge    PT Home Exercise Plan Access Code: [email or web address]    Consulted and Agree with Plan of Care Patient             Patient will benefit from skilled therapeutic intervention in order to improve the following deficits and impairments:  Decreased coordination, Increased fascial restricitons, Decreased endurance, Decreased activity tolerance, Decreased strength  Visit Diagnosis: Muscle weakness (generalized)  Other lack of coordination  Aftercare following surgery for neoplasm     Problem List Patient Active Problem List   Diagnosis Date Noted   Family history of pancreatic cancer 12/16/2020   Genetic testing 12/16/2020   Malignant neoplasm of upper-outer  quadrant of left breast in female, estrogen receptor positive ([place]) 12/03/2020   Appendicitis 05/10/2018   Situational anxiety 04/05/2018   Vitamin D deficiency 04/05/2018   Routine physical examination 12/27/2016   Menopausal symptoms 03/24/2015   Hyperlipidemia 03/24/2015   Seborrheic keratoses 03/24/2015   Weight gain 09/15/2014  Elevated BP 09/15/2014    Earlie Counts, PT 05/19/21 9:01 AM  [facility] [address], [place], [place], [address] Phone: [phone]   Fax:  [phone]  Name: Lisa Gallegos MRN: [id number] Date of Birth: 1956/05/17

## 2021-05-19 NOTE — Patient Instructions (Addendum)
/  [email or web address]  Dr. Modena Nunnery, MD  Access Code: [phone] URL: [email or web address] Date: 05/19/2021 Prepared by: Earlie Counts  Exercises Hooklying Small March - 1 x daily - 3 x weekly - 1 sets - 10 reps Supine Alternating Shoulder Flexion - 1 x daily - 7 x weekly - 1 sets - 10 reps [facility] [address] [place], [place] [address] Phone # [phone] Fax [phone]

## 2021-05-20 ENCOUNTER — Encounter: Payer: Self-pay | Admitting: Adult Health

## 2021-05-20 ENCOUNTER — Inpatient Hospital Stay: Payer: BC Managed Care – PPO | Attending: Hematology and Oncology | Admitting: Adult Health

## 2021-05-20 VITALS — BP 137/66 | HR 66 | Temp 97.8°F | Resp 18 | Wt 177.5 lb

## 2021-05-20 DIAGNOSIS — C50412 Malignant neoplasm of upper-outer quadrant of left female breast: Secondary | ICD-10-CM | POA: Insufficient documentation

## 2021-05-20 DIAGNOSIS — Z170 Estrogen receptor positive status [ER+]: Secondary | ICD-10-CM | POA: Diagnosis not present

## 2021-05-20 DIAGNOSIS — Z923 Personal history of irradiation: Secondary | ICD-10-CM | POA: Insufficient documentation

## 2021-05-20 DIAGNOSIS — Z79811 Long term (current) use of aromatase inhibitors: Secondary | ICD-10-CM | POA: Diagnosis not present

## 2021-05-20 DIAGNOSIS — Z79899 Other long term (current) drug therapy: Secondary | ICD-10-CM | POA: Diagnosis not present

## 2021-05-20 NOTE — Progress Notes (Signed)
SURVIVORSHIP VISIT:    BRIEF ONCOLOGIC HISTORY:  Oncology History  Malignant neoplasm of upper-outer quadrant of left breast in female, estrogen receptor positive ([place])  12/03/2020 Initial Diagnosis   Screening mammogram showed a left breast mass. Diagnostic mammogram and US showed a 0.5cm mass at the 12 o'clock position. Biopsy showed IDC, grade 2, HER-2 equivocal by IHC, negative by FISH (ratio 1.26), ER+ >95%, PR+ 40%, Ki67 5%.   12/15/2020 Cancer Staging   Staging form: Breast, AJCC 8th Edition - Clinical stage from 12/15/2020: Stage IA (cT1a, cN0, cM0, G2, ER+, PR+, HER2-) - Signed by Nicholas Lose, MD on 12/15/2020  Stage prefix: Initial diagnosis  Laterality: Left  Staged by: Pathologist and managing physician  Stage used in treatment planning: Yes  National guidelines used in treatment planning: Yes  Type of national guideline used in treatment planning: NCCN    12/23/2020 Surgery   Left lumpectomy Donne Hazel): IDC, grade 1, 0.6cm, clear margins, 3 left axillary lymph nodes negative for carcinoma.   01/24/2021 - 02/18/2021 Radiation Therapy   Adjuvant radiation  01/24/2021 through 02/18/2021 Site Technique Total Dose (Gy) Dose per Fx (Gy) Completed Fx Beam Energies  Breast, Left: Breast_Lt 3D 42.56/42.56 2.66 16/16 6X  Breast, Left: Breast_Lt_Bst 3D 8/8 2 4/4 6X    03/2021 -  Anti-estrogen oral therapy   Anastrozole daily     INTERVAL HISTORY:  Lisa Gallegos to review her survivorship care plan detailing her treatment course for breast cancer, as well as monitoring long-term side effects of that treatment, education regarding health maintenance, screening, and overall wellness and health promotion.     Overall, Lisa Gallegos reports feeling quite well.  She continues on Anastrozole with good tolerance.  She is taking this daily and has no side effects.  She is recovering from her treatment quite well.    REVIEW OF SYSTEMS:  Review of Systems  Constitutional:  Negative for appetite  change, chills, fatigue, fever and unexpected weight change.  HENT:   Negative for hearing loss, lump/mass and trouble swallowing.   Eyes:  Negative for eye problems and icterus.  Respiratory:  Negative for chest tightness, cough and shortness of breath.   Cardiovascular:  Negative for chest pain, leg swelling and palpitations.  Gastrointestinal:  Negative for abdominal distention, abdominal pain, constipation, diarrhea, nausea and vomiting.  Endocrine: Negative for hot flashes.  Genitourinary:  Negative for difficulty urinating.   Musculoskeletal:  Negative for arthralgias.  Skin:  Negative for itching and rash.  Neurological:  Negative for dizziness, extremity weakness, headaches and numbness.  Hematological:  Negative for adenopathy. Does not bruise/bleed easily.  Psychiatric/Behavioral:  Negative for depression. The patient is not nervous/anxious.   Breast: Denies any new nodularity, masses, tenderness, nipple changes, or nipple discharge.      ONCOLOGY TREATMENT TEAM:  1. Surgeon:  Dr. Donne [facility]el at [facility] 2. Medical Oncologist: Dr. Lindi Adie  3. Radiation Oncologist: Dr. Lisbeth Renshaw    PAST MEDICAL/SURGICAL HISTORY:  Past Medical History:  Diagnosis Date   Anxiety    Cancer ([place]) 11/2020   left breast IDC   Family history of pancreatic cancer 12/16/2020   Past Surgical History:  Procedure Laterality Date   APPENDECTOMY     BREAST LUMPECTOMY WITH RADIOACTIVE SEED AND AXILLARY LYMPH NODE DISSECTION Left 12/23/2020   Procedure: LEFT BREAST LUMPECTOMY WITH RADIOACTIVE SEED AND LEFT AXILLARY LYMPH NODE BIOPSY;  Surgeon: Rolm Bookbinder, MD;  Location: [place];  Service: General;  Laterality: Left;  PEC BLOCK; START TIME  OF 11:30 AM FOR 60 MINUTES ROOM 2 [place] IQ   CESAREAN SECTION  1985   FTP   KIDNEY STONE SURGERY     laser surgery   LAPAROSCOPIC APPENDECTOMY N/A 05/10/2018   Procedure: APPENDECTOMY LAPAROSCOPIC;  Surgeon: Excell Seltzer,  MD;  Location: [place];  Service: General;  Laterality: N/A;     ALLERGIES:  No Known Allergies   CURRENT MEDICATIONS:  Outpatient Encounter Medications as of 05/20/2021  Medication Sig   anastrozole (ARIMIDEX) 1 MG tablet Take 1 tablet (1 mg total) by mouth daily.   No facility-administered encounter medications on file as of 05/20/2021.     ONCOLOGIC FAMILY HISTORY:  Family History  Problem Relation Age of Onset   Pancreatic cancer Mother 59   Heart disease Father    Lung cancer Maternal Uncle        dx after 74, smoking hx   Breast cancer Neg Hx      GENETIC COUNSELING/TESTING: See above  SOCIAL HISTORY:  Social History   Socioeconomic History   Marital status: Single    Spouse name: Not on file   Number of children: 2   Years of education: Not on file   Highest education level: Not on file  Occupational History   Not on file  Tobacco Use   Smoking status: Never   Smokeless tobacco: Never  Substance and Sexual Activity   Alcohol use: Not Currently    Alcohol/week: 0.0 standard drinks   Drug use: No   Sexual activity: Yes    Birth control/protection: Post-menopausal  Other Topics Concern   Not on file  Social History Narrative   Lives in [place] with fiance. 2 dogs       Work - Personal assistant.      Diet - chicken and fish with veggies, avoids red meat. Vegetarian to bring cholesterol down as doesn't want to be treated with cholesterol medications if she can avoid it.       Social Determinants of Health   Financial Resource Strain: Low Risk    Difficulty of Paying Living Expenses: Not hard at all  Food Insecurity: No Food Insecurity   Worried About Charity fundraiser in the Last Year: Never true   [place] in the Last Year: Never true  Transportation Needs: No Transportation Needs   Lack of Transportation (Medical): No   Lack of Transportation (Non-Medical): No  Physical Activity: Not on file  Stress: Not on file   Social Connections: Not on file  Intimate Partner Violence: Not on file     OBSERVATIONS/OBJECTIVE:  BP 137/66 (BP Location: Left Arm, Patient Position: Sitting)   Pulse 66   Temp 97.8 F (36.6 C) (Tympanic)   Resp 18   Wt 177 lb 8 oz (80.5 kg)   LMP 02/17/2012   SpO2 98%   BMI 29.54 kg/m  GENERAL: Patient is a well appearing female in no acute distress HEENT:  Sclerae anicteric.  Mask in place. Neck is supple.  NODES:  No cervical, supraclavicular, or axillary lymphadenopathy palpated.  BREAST EXAM:  left breast s/p lumpectomy and radiation, no sign of local recurrence, right breast benign.  LUNGS:  Clear to auscultation bilaterally.  No wheezes or rhonchi. HEART:  Regular rate and rhythm. No murmur appreciated. ABDOMEN:  Soft, nontender.  Positive, normoactive bowel sounds. No organomegaly palpated. MSK:  No focal spinal tenderness to palpation. Full range of motion bilaterally in the upper extremities. EXTREMITIES:  No peripheral edema.  SKIN:  Clear with no obvious rashes or skin changes. No nail dyscrasia. NEURO:  Nonfocal. Well oriented.  Appropriate affect.   LABORATORY DATA:  None for this visit.  DIAGNOSTIC IMAGING:  None for this visit.      ASSESSMENT AND PLAN:  Ms.. Gallegos is a pleasant 65 y.o. female with Stage IA left breast invasive ductal carcinoma, ER+/PR+/HER2-, diagnosed in 11/2020, treated with lumpectomy, adjuvant radiation therapy, and anti-estrogen therapy with Anastrozole beginning in 03/2021.  She presents to the Survivorship Clinic for our initial meeting and routine follow-up post-completion of treatment for breast cancer.    1. Stage IA left breast cancer:  Lisa Gallegos is continuing to recover from definitive treatment for breast cancer. She will follow-up with her medical oncologist, Dr. Lindi Adie in 6 months with history and physical exam per surveillance protocol.  She will continue her anti-estrogen therapy with Anastrozole. Thus far, she is  tolerating the Anastrozole well, with minimal side effects. She was instructed to make Dr. Lindi Adie or myself aware if she begins to experience any worsening side effects of the medication and I could see her back in clinic to help manage those side effects, as needed. Her mammogram is due 10/2021; orders placed today. Today, a comprehensive survivorship care plan and treatment summary was reviewed with the patient today detailing her breast cancer diagnosis, treatment course, potential late/long-term effects of treatment, appropriate follow-up care with recommendations for the future, and patient education resources.  A copy of this summary, along with a letter will be sent to the patient's primary care provider via mail/fax/In Basket message after today's visit.    2. Bone health:  Given Lisa Gallegos's age/history of breast cancer and her current treatment regimen including anti-estrogen therapy with Anastrozole, she is at risk for bone demineralization.  Her last DEXA scan was 03/17/2021, which showed a normal bone density with a T score of -0.5 in the AP spine.  She wil be due for repeat in 2 years.  In the meantime, she was encouraged to increase her consumption of foods rich in calcium, as well as increase her weight-bearing activities.  She was given education on specific activities to promote bone health.  3. Cancer screening:  Due to Lisa Gallegos's history and her age, she should receive screening for skin cancers, colon cancer, and gynecologic cancers.  The information and recommendations are listed on the patient's comprehensive care plan/treatment summary and were reviewed in detail with the patient.    4. Health maintenance and wellness promotion: Lisa Gallegos was encouraged to consume 5-7 servings of fruits and vegetables per day. We reviewed the "Nutrition Rainbow" handout, as well as the handout "Take Control of Your Health and Reduce Your Cancer Risk" from the [place].  She was also  encouraged to engage in moderate to vigorous exercise for 30 minutes per day most days of the week. We discussed the [employer] fitness program, which is designed for cancer survivors to help them become more physically fit after cancer treatments.  She was instructed to limit her alcohol consumption and continue to abstain from tobacco use.     5. Support services/counseling: It is not uncommon for this period of the patient's cancer care trajectory to be one of many emotions and stressors.  We discussed how this can be increasingly difficult during the times of quarantine and social distancing due to the COVID-19 pandemic.   She was given information regarding our available services and encouraged to contact me with any  questions or for help enrolling in any of our support group/programs.    Follow up instructions:    -Return to cancer center in 6 months for f/u with Dr. Lindi Adie  -Mammogram due in 10/2021 -Bone density repeat in 03/2023 -Follow up with surgery in one year -She is welcome to return back to the Survivorship Clinic at any time; no additional follow-up needed at this time.  -Consider referral back to survivorship as a long-term survivor for continued surveillance  The patient was provided an opportunity to ask questions and all were answered. The patient agreed with the plan and demonstrated an understanding of the instructions.   Total encounter time: 30 minutes in face to face visit time, chart review, lab review, order entry, care coordination and documentation of the encounter.   Wilber Bihari, NP 05/20/21 3:19 PM Medical Oncology and Hematology [facility] [place], [place] [address] Tel. [phone]    Fax. [phone]  *Total Encounter Time as defined by the Centers for Medicare and Medicaid Services includes, in addition to the face-to-face time of a patient visit (documented in the note above) non-face-to-face time: obtaining and  reviewing outside history, ordering and reviewing medications, tests or procedures, care coordination (communications with other health care professionals or caregivers) and documentation in the medical record.

## 2021-05-29 NOTE — Progress Notes (Signed)
Cardiology Office Note:   Date:  05/30/2021  NAME:  Lisa Gallegos    MRN: [id number] DOB:  [birth year]   PCP:  Burnard Hawthorne, FNP  Cardiologist:  None  Electrophysiologist:  None   Referring MD: Burnard Hawthorne, FNP   Chief Complaint  Patient presents with   Follow-up   History of Present Illness:   Lisa Gallegos is a 65 y.o. female with a hx of stage I breast cancer status postlumpectomy and radiation, hyperlipidemia who presents for follow-up.  Evaluated earlier this year for cardiovascular risk assessment.  Underwent coronary calcium scoring which demonstrated calcium score of 105 which is 85th percentile.  Statin recommended.  She wishes to discuss this further today.  We discussed her calcium scoring in the office.  Scores above the 75th percentile.  No other risk factors for CAD but I would recommend statin therapy.  She reports she is hesitant to do this.  She is worried about side effects.  She is eating a immaculate diet.  She reports minimal red meat consumption and no pork.  She reports that she mainly eats fish as well as vegetables.  She reports is not a big meat eater.  She is exercising 3 days/week.  This includes walking up to 1 mile as well as sessions doing weight training.  No chest pain or trouble breathing with this.  Her father did have a heart attack in his 51s.  I suspect this is a genetic issue for her.  She reports that she would like to recheck her cholesterol as her most recent value was in 2019.  We will then make decisions and recommendations based on that.  She would like to avoid statins at all cost but is amendable to trying it if needed.  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 HLD -Total cholesterol 255, HDL 61, LDL 176, triglycerides 93 3. Coronary calcium -CAC score 105 (85th  percentile) -located in LAD -Fam HX (Father in 49s CABG)  Past Medical History: Past Medical History:  Diagnosis Date   Anxiety    Cancer ([place]) 11/2020   left breast IDC   Family history of pancreatic cancer 12/16/2020    Past Surgical History: Past Surgical History:  Procedure Laterality Date   APPENDECTOMY     BREAST LUMPECTOMY WITH RADIOACTIVE SEED AND AXILLARY LYMPH NODE DISSECTION Left 12/23/2020   Procedure: LEFT BREAST LUMPECTOMY WITH RADIOACTIVE SEED AND LEFT AXILLARY LYMPH NODE BIOPSY;  Surgeon: Rolm Bookbinder, MD;  Location: [facility];  Service: General;  Laterality: Left;  PEC BLOCK; START TIME OF 11:30 AM FOR 60 MINUTES ROOM 2 [place] IQ   [place] surgery   LAPAROSCOPIC APPENDECTOMY N/A 05/10/2018   Procedure: APPENDECTOMY LAPAROSCOPIC;  Surgeon: Excell Seltzer, MD;  Location: [facility];  Service: General;  Laterality: N/A;    Current Medications: Current Meds  Medication Sig   anastrozole (ARIMIDEX) 1 MG tablet Take 1 tablet (1 mg total) by mouth daily.     Allergies:    Patient has no known allergies.   Social History: Social History   Socioeconomic History   Marital status: Single    Spouse name: Not on file   Number of children: 2   Years of education: Not on file   Highest education level:  Not on file  Occupational History   Not on file  Tobacco Use   Smoking status: Never   Smokeless tobacco: Never  Substance and Sexual Activity   Alcohol use: Not Currently    Alcohol/week: 0.0 standard drinks   Drug use: No   Sexual activity: Yes    Birth control/protection: Post-menopausal  Other Topics Concern   Not on file  Social History Narrative   Lives in [place] with fiance. 2 dogs       Work - Personal assistant.      Diet - chicken and fish with veggies, avoids red meat. Vegetarian to bring cholesterol down as doesn't want to be treated with  cholesterol medications if she can avoid it.       Social Determinants of Health   Financial Resource Strain: Low Risk    Difficulty of Paying Living Expenses: Not hard at all  Food Insecurity: No Food Insecurity   Worried About Charity fundraiser in the Last Year: Never true   [place] in the Last Year: Never true  Transportation Needs: No Transportation Needs   Lack of Transportation (Medical): No   Lack of Transportation (Non-Medical): No  Physical Activity: Not on file  Stress: Not on file  Social Connections: Not on file     Family History: The patient's family history includes Heart disease in her father; Lung cancer in her maternal uncle; Pancreatic cancer (age of onset: 61) in her mother. There is no history of Breast cancer.  ROS:   All other ROS reviewed and negative. Pertinent positives noted in the HPI.     EKGs/Labs/Other Studies Reviewed:   The following studies were personally reviewed by me today:  Recent Labs: 12/15/2020: ALT 36; BUN 18; Creatinine 0.88; Hemoglobin 13.6; Platelet Count 267; Potassium 4.5; Sodium 141   Recent Lipid Panel    Component Value Date/Time   CHOL 255 (H) 04/05/2018 0904   TRIG 93.0 04/05/2018 0904   HDL 61.00 04/05/2018 0904   CHOLHDL 4 04/05/2018 0904   VLDL 18.6 04/05/2018 0904   LDLCALC 176 (H) 04/05/2018 0904    Physical Exam:   VS:  BP 140/80   Pulse 71   Ht '5\' 5"'  (1.651 m)   Wt 177 lb 3.2 oz (80.4 kg)   LMP 02/17/2012   SpO2 97%   BMI 29.49 kg/m    Wt Readings from Last 3 Encounters:  05/30/21 177 lb 3.2 oz (80.4 kg)  05/20/21 177 lb 8 oz (80.5 kg)  04/21/21 177 lb (80.3 kg)    General: Well nourished, well developed, in no acute distress Head: Atraumatic, normal size  Eyes: PEERLA, EOMI  Neck: Supple, no JVD Endocrine: No thryomegaly Cardiac: Normal S1, S2; RRR; no murmurs, rubs, or gallops Lungs: Clear to auscultation bilaterally, no wheezing, rhonchi or rales  Abd: Soft, nontender, no hepatomegaly   Ext: No edema, pulses 2+ Musculoskeletal: No deformities, BUE and BLE strength normal and equal Skin: Warm and dry, no rashes   Neuro: Alert and oriented to person, place, time, and situation, CNII-XII grossly intact, no focal deficits  Psych: Normal mood and affect   ASSESSMENT:   Lisa Gallegos is a 65 y.o. female who presents for the following: 1. Coronary artery disease involving native coronary artery of native heart without angina pectoris   2. Agatston coronary artery calcium score less than 100   3. Mixed hyperlipidemia     PLAN:   1. Coronary artery disease involving native  coronary artery of native heart without angina pectoris 2. Agatston coronary artery calcium score less than 100 3. Mixed hyperlipidemia -Coronary calcium score 105 in the LAD distribution.  This is a 85th percentile.  We discussed that values above the 75th percentile would likely warrant statin initiation.  Her most recent LDL was 176 in 2019.  She reports she would like to recheck her labs to see where her LDL cholesterol is.  She reports she would then like to try a lifestyle modification trial.  I did inform her that statin is recommended.  For now she would like to hold on that.  She would like to see what her LDL actually is.  I think this is reasonable.  She has no other risk factors for CAD.  She is eating well.  She is exercising well.  I suspect this is all from her father who had bypass in his 59s.  I did inform her that genetic issues are hard to overcome.  She has no symptoms concerning for obstructive CAD.  She was counseled on exercise regimens.  She is hitting her goal.  Disposition: Return in about 6 months (around 11/30/2021).  Medication Adjustments/Labs and Tests Ordered: Current medicines are reviewed at length with the patient today.  Concerns regarding medicines are outlined above.  Orders Placed This Encounter  Procedures   Lipid panel   No orders of the defined types were placed in this  encounter.   Patient Instructions  Medication Instructions:  The current medical regimen is effective;  continue present plan and medications.  *If you need a refill on your cardiac medications before your next appointment, please call your pharmacy*   Lab Work: LIPID (you may come to [place], [place] [address] no appointment needed, or a [facility] closer to you) (you will need to be fasting-nothing to eat or drink)  If you have labs (blood work) drawn today and your tests are completely normal, you will receive your results only by: [place] (if you have MyChart) OR A paper copy in the mail If you have any lab test that is abnormal or we need to change your treatment, we will call you to review the results.   Follow-Up: At [facility], you and your health needs are our priority.  As part of our continuing mission to provide you with exceptional heart care, we have created designated Provider Care Teams.  These Care Teams include your primary Cardiologist (physician) and Advanced Practice Providers (APPs -  Physician Assistants and Nurse Practitioners) who all work together to provide you with the care you need, when you need it.  We recommend signing up for the patient portal called "MyChart".  Sign up information is provided on this After Visit Summary.  MyChart is used to connect with patients for Virtual Visits (Telemedicine).  Patients are able to view lab/test results, encounter notes, upcoming appointments, etc.  Non-urgent messages can be sent to your provider as well.   To learn more about what you can do with MyChart, go to [email or web address].    Your next appointment:   6 month(s)  The format for your next appointment:   In Person  Provider:   Eleonore Chiquito, MD     Time Spent with Patient: I have spent a total of 35 minutes with patient reviewing hospital notes, telemetry, EKGs, labs and examining the patient as well as establishing an  assessment and plan that was discussed with the patient.  > 50% of time  was spent in direct patient care.  Signed, Addison Naegeli. Audie Box, MD, [place]  [address] [place], [place] [address] [phone]  05/30/2021 10:18 AM

## 2021-05-30 ENCOUNTER — Ambulatory Visit (INDEPENDENT_AMBULATORY_CARE_PROVIDER_SITE_OTHER): Payer: BC Managed Care – PPO | Admitting: Cardiovascular Disease

## 2021-05-30 ENCOUNTER — Other Ambulatory Visit: Payer: Self-pay

## 2021-05-30 ENCOUNTER — Encounter (HOSPITAL_BASED_OUTPATIENT_CLINIC_OR_DEPARTMENT_OTHER): Payer: Self-pay | Admitting: Cardiovascular Disease

## 2021-05-30 VITALS — BP 140/80 | HR 71 | Ht 65.0 in | Wt 177.2 lb

## 2021-05-30 DIAGNOSIS — R931 Abnormal findings on diagnostic imaging of heart and coronary circulation: Secondary | ICD-10-CM

## 2021-05-30 DIAGNOSIS — I2510 Atherosclerotic heart disease of native coronary artery without angina pectoris: Secondary | ICD-10-CM | POA: Diagnosis not present

## 2021-05-30 DIAGNOSIS — E782 Mixed hyperlipidemia: Secondary | ICD-10-CM

## 2021-05-30 NOTE — Patient Instructions (Signed)
Medication Instructions:  The current medical regimen is effective;  continue present plan and medications.  *If you need a refill on your cardiac medications before your next appointment, please call your pharmacy*   Lab Work: LIPID (you may come to [place], [place] [address] no appointment needed, or a [facility] closer to you) (you will need to be fasting-nothing to eat or drink)  If you have labs (blood work) drawn today and your tests are completely normal, you will receive your results only by: [place] (if you have MyChart) OR A paper copy in the mail If you have any lab test that is abnormal or we need to change your treatment, we will call you to review the results.   Follow-Up: At [facility], you and your health needs are our priority.  As part of our continuing mission to provide you with exceptional heart care, we have created designated Provider Care Teams.  These Care Teams include your primary Cardiologist (physician) and Advanced Practice Providers (APPs -  Physician Assistants and Nurse Practitioners) who all work together to provide you with the care you need, when you need it.  We recommend signing up for the patient portal called "MyChart".  Sign up information is provided on this After Visit Summary.  MyChart is used to connect with patients for Virtual Visits (Telemedicine).  Patients are able to view lab/test results, encounter notes, upcoming appointments, etc.  Non-urgent messages can be sent to your provider as well.   To learn more about what you can do with MyChart, go to [email or web address].    Your next appointment:   6 month(s)  The format for your next appointment:   In Person  Provider:   Eleonore Chiquito, MD

## 2021-06-02 ENCOUNTER — Other Ambulatory Visit: Payer: Self-pay

## 2021-06-02 ENCOUNTER — Ambulatory Visit: Payer: BC Managed Care – PPO | Admitting: Physical Therapy

## 2021-06-02 ENCOUNTER — Encounter: Payer: Self-pay | Admitting: Physical Therapy

## 2021-06-02 DIAGNOSIS — M6281 Muscle weakness (generalized): Secondary | ICD-10-CM | POA: Diagnosis not present

## 2021-06-02 DIAGNOSIS — Z483 Aftercare following surgery for neoplasm: Secondary | ICD-10-CM

## 2021-06-02 DIAGNOSIS — R278 Other lack of coordination: Secondary | ICD-10-CM

## 2021-06-02 NOTE — Therapy (Signed)
[facility] [address] [place], [place], [address] Phone: [phone]   Fax:  [phone]  Physical Therapy Treatment  Patient Details  Name: Lisa Gallegos MRN: [id number] Date of Birth: [birth year] Referring Provider (PT): Nicholas Lose   Encounter Date: 06/02/2021   PT End of Session - 06/02/21 0837     Visit Number 4    Authorization Type BCBS    Authorization - Visit Number 7   total pelvic floor and lymphedema   Authorization - Number of Visits 30    PT Start Time 0800    PT Stop Time 9379    PT Time Calculation (min) 38 min    Activity Tolerance Patient tolerated treatment well    Behavior During Therapy [facility] for tasks assessed/performed             Past Medical History:  Diagnosis Date   Anxiety    Cancer ([place]) 11/2020   left breast IDC   Family history of pancreatic cancer 12/16/2020    Past Surgical History:  Procedure Laterality Date   APPENDECTOMY     BREAST LUMPECTOMY WITH RADIOACTIVE SEED AND AXILLARY LYMPH NODE DISSECTION Left 12/23/2020   Procedure: LEFT BREAST LUMPECTOMY WITH RADIOACTIVE SEED AND LEFT AXILLARY LYMPH NODE BIOPSY;  Surgeon: Rolm Bookbinder, MD;  Location: [facility];  Service: General;  Laterality: Left;  PEC BLOCK; START TIME OF 11:30 AM FOR 60 MINUTES ROOM 2 [place] IQ   [facility] surgery   LAPAROSCOPIC APPENDECTOMY N/A 05/10/2018   Procedure: APPENDECTOMY LAPAROSCOPIC;  Surgeon: Excell Seltzer, MD;  Location: [facility];  Service: General;  Laterality: N/A;    There were no vitals filed for this visit.   Subjective Assessment - 06/02/21 0803     Subjective Everything is going well.    Pertinent History Patient was diagnosed on 11/15/2020 with left grade II invasive ductal carcinoma breast cancer. Patient underwent a left lumpectomy and sentinel node biopsy (3 negative nodes) on 12/23/2020. It is  ER/PR positive and HER2 negative with a Ki67 of 5%.    Patient Stated Goals to have less vaginal dryness    Currently in Pain? No/denies                [facility] PT Assessment - 06/02/21 0001       Assessment   Medical Diagnosis Malignant Lt breast cancer, estrogen receptor (+)    Referring Provider (PT) Nicholas Lose    Onset Date/Surgical Date 12/23/20   12/03/20 initial diagnosis; 01/25/21 started radiation   Prior Therapy x2 sessions in cancer rehab      Precautions   Precautions --   breast cancer with radiation     Restrictions   Weight Bearing Restrictions No      [place] residence      Prior Function   Level of Independence Independent    Vocation Full time employment    Leisure starting [place] 3x per week      Cognition   Overall Cognitive Status Within Functional Limits for tasks assessed      Posture/Postural Control   Posture/Postural Control Postural limitations    Postural Limitations Increased thoracic kyphosis   mild   Posture Comments full motion, no trendelenburg      AROM   Thoracic Flexion [facility]    Thoracic Extension [facility]    Thoracic -  Right Side [facility]    Thoracic - Left Side [place] [facility]    Thoracic - Right Rotation [facility]    Thoracic - Left Rotation [facility]      Strength   Right Hip ABduction 5/5    Right Hip ADduction 5/5    Left Hip ABduction 5/5    Left Hip ADduction 5/5      Palpation   SI assessment  ASIS is equal                        Pelvic Floor Special Questions - 06/02/21 0001     Urinary Leakage No    Fecal incontinence No    Strength good squeeze, good lift, able to hold agaisnt strong resistance               [facility] Adult PT Treatment/Exercise - 06/02/21 0001       Lumbar Exercises: Standing   Other Standing Lumbar Exercises [place] in semi lunge position using red band 10x each way      Lumbar Exercises: Supine   Dead Bug 20 reps;1 second    Dead Bug Limitations  with pelvic floor contraction    Bridge with March 20 reps;1 second      Lumbar Exercises: Sidelying   Other Sidelying Lumbar Exercises side plank on wall 1 time; side plank on high mat with 10x moving hip up and down      Lumbar Exercises: Quadruped   Opposite Arm/Leg Raise Right arm/Left leg;Left arm/Right leg;10 reps;1 second                    PT Education - 06/02/21 0837     Education Details Access Code: [email or web address]    Person(s) Educated Patient    Methods Explanation;Demonstration;Verbal cues;Handout    Comprehension Returned demonstration;Verbalized understanding              PT Short Term Goals - 05/19/21 0813       PT SHORT TERM GOAL #1   Title independent with initial HEP for pelvic floor strength    Time 4    Period Weeks    Status Achieved               PT Long Term Goals - 06/02/21 7948       PT LONG TERM GOAL #1   Title independent with HEP for pelvic floor and core strength    Time 4    Period Months    Status Achieved      PT LONG TERM GOAL #2   Title vaginal dryness reduced >/= 75% due to using moisturizers to the perineal area and good vaginal health    Time 4    Period Months    Status Achieved      PT LONG TERM GOAL #3   Title able to laugh with a full bladder and not leak urine due to increased pelvic floor strength >/= 3/5 holding for 10 seconds    Time 4    Period Months    Status Achieved                   Plan - 06/02/21 0165     Clinical Impression Statement Patient is not having urinary leakage. She reports her vaginal dryness in 75% better. She is using the coconut oil vaginally and stopped using soap in the vaginal area. Patient has full strength of her hips. Patient is independent with  her HEP and able to return demonstration. No pain with sex. Patient is ready for discharge.    Personal Factors and Comorbidities Sex    Examination-Activity Limitations Continence    Examination-Participation  Restrictions Interpersonal Relationship    Stability/Clinical Decision Making Evolving/Moderate complexity    Rehab Potential Good    PT Treatment/Interventions ADLs/Self Care Home Management;Biofeedback;Therapeutic activities;Therapeutic exercise;Neuromuscular re-education;Patient/family education;Manual techniques;Dry needling    PT Next Visit Plan Discharge to HEP. Patient will continue to follow with the Cancer center for her lymphedema    PT Home Exercise Plan Access Code: [email or web address]    Consulted and Agree with Plan of Care Patient             Patient will benefit from skilled therapeutic intervention in order to improve the following deficits and impairments:  Decreased coordination, Increased fascial restricitons, Decreased endurance, Decreased activity tolerance, Decreased strength  Visit Diagnosis: Muscle weakness (generalized)  Other lack of coordination  Aftercare following surgery for neoplasm     Problem List Patient Active Problem List   Diagnosis Date Noted   Family history of pancreatic cancer 12/16/2020   Genetic testing 12/16/2020   Malignant neoplasm of upper-outer quadrant of left breast in female, estrogen receptor positive ([place]) 12/03/2020   Appendicitis 05/10/2018   Situational anxiety 04/05/2018   Vitamin D deficiency 04/05/2018   Routine physical examination 12/27/2016   Menopausal symptoms 03/24/2015   Hyperlipidemia 03/24/2015   Seborrheic keratoses 03/24/2015   Weight gain 09/15/2014   Elevated BP 09/15/2014    Earlie Counts, PT 06/02/21 8:41 AM  [facility] [address] [place], [place], [address] Phone: [phone]   Fax:  [phone]  Name: Lisa Gallegos MRN: [id number] Date of Birth: [birth year]  PHYSICAL THERAPY DISCHARGE SUMMARY  Visits from Start of Care: 4  Current functional level related to goals / functional outcomes:   See above.  Remaining deficits: See above.     Education / Equipment: HEP   Patient agrees to discharge. Patient goals were met. Patient is being discharged due to meeting the stated rehab goals. Thank you for the referral. Earlie Counts, PT 06/02/21 8:42 AM'

## 2021-06-02 NOTE — Patient Instructions (Signed)
Access Code: [email or web address] URL: [email or web address] Date: 06/02/2021 Prepared by: Graball - 1 x daily - 4 x weekly - 2 sets - 10 reps Marching Bridge - 1 x daily - 4 x weekly - 1 sets - 10 reps Quadruped Pelvic Floor Contraction with Opposite Arm and Leg Lift - 1 x daily - 4 x weekly - 2 sets - 10 reps Side Plank on Elbow - 1 x daily - 7 x weekly - 1 sets - 10 reps Standing Anti-Rotation Press with Anchored Resistance - 1 x daily - 7 x weekly - 2 sets - 10 reps [facility] [address] [place], [place] [address] Phone # [phone] Fax [phone]

## 2021-06-27 ENCOUNTER — Ambulatory Visit: Payer: Self-pay

## 2021-06-29 ENCOUNTER — Ambulatory Visit: Payer: BC Managed Care – PPO | Attending: Hematology and Oncology

## 2021-06-29 ENCOUNTER — Other Ambulatory Visit: Payer: Self-pay

## 2021-06-29 VITALS — Wt 178.4 lb

## 2021-06-29 DIAGNOSIS — Z483 Aftercare following surgery for neoplasm: Secondary | ICD-10-CM | POA: Insufficient documentation

## 2021-06-29 NOTE — Therapy (Signed)
[place], [place], [address] Phone: [phone]   Fax:  [phone]  Physical Therapy Treatment  Patient Details  Name: Lisa Gallegos MRN: [id number] Date of Birth: 1956/07/22 Referring Provider (PT): Nicholas Lose   Encounter Date: 06/29/2021   PT End of Session - 06/29/21 0946     Visit Number 4   # unchanged due to screen only   PT Start Time 0939    PT Stop Time 0946    PT Time Calculation (min) 7 min    Activity Tolerance Patient tolerated treatment well    Behavior During Therapy [facility] for tasks assessed/performed             Past Medical History:  Diagnosis Date   Anxiety    Cancer ([place]) 11/2020   left breast IDC   Family history of pancreatic cancer 12/16/2020    Past Surgical History:  Procedure Laterality Date   APPENDECTOMY     BREAST LUMPECTOMY WITH RADIOACTIVE SEED AND AXILLARY LYMPH NODE DISSECTION Left 12/23/2020   Procedure: LEFT BREAST LUMPECTOMY WITH RADIOACTIVE SEED AND LEFT AXILLARY LYMPH NODE BIOPSY;  Surgeon: Rolm Bookbinder, MD;  Location: [place];  Service: General;  Laterality: Left;  PEC BLOCK; START TIME OF 11:30 AM FOR 60 MINUTES ROOM 2 [place] IQ   [place]     laser surgery   LAPAROSCOPIC APPENDECTOMY N/A 05/10/2018   Procedure: APPENDECTOMY LAPAROSCOPIC;  Surgeon: Excell Seltzer, MD;  Location: [place];  Service: General;  Laterality: N/A;    Vitals:   06/29/21 0938  Weight: 178 lb 6 oz (80.9 kg)     Subjective Assessment - 06/29/21 0940     Subjective Pt returns for her 3 month L-Dex screen.    Pertinent History Patient was diagnosed on 11/15/2020 with left grade II invasive ductal carcinoma breast cancer. Patient underwent a left lumpectomy and sentinel node biopsy (3 negative nodes) on 12/23/2020. It is ER/PR positive and HER2 negative with a Ki67 of 5%.                     L-DEX FLOWSHEETS - 06/29/21 0900       L-DEX LYMPHEDEMA SCREENING   Measurement Type Unilateral    L-DEX MEASUREMENT EXTREMITY Upper Extremity    POSITION  Standing    DOMINANT SIDE Right    At Risk Side Left    BASELINE SCORE (UNILATERAL) 2.2    L-DEX SCORE (UNILATERAL) 2.8    VALUE CHANGE (UNILAT) 0.6                                  PT Short Term Goals - 05/19/21 0813       PT SHORT TERM GOAL #1   Title independent with initial HEP for pelvic floor strength    Time 4    Period Weeks    Status Achieved               PT Long Term Goals - 06/02/21 2119       PT LONG TERM GOAL #1   Title independent with HEP for pelvic floor and core strength    Time 4    Period Months    Status Achieved      PT LONG TERM GOAL #2   Title vaginal dryness reduced >/= 75% due to  using moisturizers to the perineal area and good vaginal health    Time 4    Period Months    Status Achieved      PT LONG TERM GOAL #3   Title able to laugh with a full bladder and not leak urine due to increased pelvic floor strength >/= 3/5 holding for 10 seconds    Time 4    Period Months    Status Achieved                   Plan - 06/29/21 0947     Clinical Impression Statement Pt returns for her 3 month L-Dex screen. Her change from baseline of 0.6 is WNLs so no further treatment is required at this time except to cont every 3 month L-Dex screen which pt is agreeable to. Pt is flying to [place] in Nov so issued her a script to get a prophylactic compression sleeve.    PT Next Visit Plan Cont every 3 month L-Dex screens for up to 2 years from her SLNB.    Consulted and Agree with Plan of Care Patient             Patient will benefit from skilled therapeutic intervention in order to improve the following deficits and impairments:     Visit Diagnosis: Aftercare following surgery for neoplasm     Problem List Patient Active Problem List   Diagnosis  Date Noted   Family history of pancreatic cancer 12/16/2020   Genetic testing 12/16/2020   Malignant neoplasm of upper-outer quadrant of left breast in female, estrogen receptor positive ([place]) 12/03/2020   Appendicitis 05/10/2018   Situational anxiety 04/05/2018   Vitamin D deficiency 04/05/2018   Routine physical examination 12/27/2016   Menopausal symptoms 03/24/2015   Hyperlipidemia 03/24/2015   Seborrheic keratoses 03/24/2015   Weight gain 09/15/2014   Elevated BP 09/15/2014    Lisa Gallegos, PTA 06/29/2021, 9:48 AM  [place], [place], [address] Phone: [phone]   Fax:  [phone]  Name: Lisa Gallegos MRN: [id number] Date of Birth: 1956/04/16

## 2021-06-30 DIAGNOSIS — E782 Mixed hyperlipidemia: Secondary | ICD-10-CM | POA: Diagnosis not present

## 2021-06-30 LAB — LIPID PANEL
Chol/HDL Ratio: 5.8 ratio — ABNORMAL HIGH (ref 0.0–4.4)
Cholesterol, Total: 239 mg/dL — ABNORMAL HIGH (ref 100–199)
HDL: 41 mg/dL (ref >39)
LDL Chol Calc (NIH): 168 mg/dL — ABNORMAL HIGH (ref 0–99)
Triglycerides: 163 mg/dL — ABNORMAL HIGH (ref 0–149)
VLDL Cholesterol Cal: 30 mg/dL (ref 5–40)

## 2021-07-12 ENCOUNTER — Encounter (HOSPITAL_BASED_OUTPATIENT_CLINIC_OR_DEPARTMENT_OTHER): Payer: Self-pay

## 2021-07-12 MED ORDER — ATORVASTATIN CALCIUM 20 MG PO TABS: 20.0000 mg | ORAL_TABLET | ORAL | 3 refills | Status: DC

## 2021-10-04 ENCOUNTER — Ambulatory Visit: Payer: BC Managed Care – PPO | Admitting: Dermatology

## 2021-10-24 ENCOUNTER — Other Ambulatory Visit: Payer: Self-pay

## 2021-10-24 ENCOUNTER — Ambulatory Visit: Payer: BC Managed Care – PPO | Attending: Hematology and Oncology

## 2021-10-24 VITALS — Wt 183.1 lb

## 2021-10-24 DIAGNOSIS — Z483 Aftercare following surgery for neoplasm: Secondary | ICD-10-CM | POA: Insufficient documentation

## 2021-10-24 NOTE — Therapy (Signed)
[facility] [place], [place], [address] Phone: [phone]   Fax:  [phone]  Physical Therapy Treatment  Patient Details  Name: Lisa Gallegos MRN: [id number] Date of Birth: 07-Mar-1956 Referring Provider (PT): Nicholas Lose   Encounter Date: 10/24/2021   PT End of Session - 10/24/21 0855     Visit Number 4   # unchanged due to screen only   PT Start Time 4656    PT Stop Time 0858    PT Time Calculation (min) 7 min    Activity Tolerance Patient tolerated treatment well    Behavior During Therapy [facility] for tasks assessed/performed             Past Medical History:  Diagnosis Date   Anxiety    Cancer ([place]) 11/2020   left breast IDC   Family history of pancreatic cancer 12/16/2020    Past Surgical History:  Procedure Laterality Date   APPENDECTOMY     BREAST LUMPECTOMY WITH RADIOACTIVE SEED AND AXILLARY LYMPH NODE DISSECTION Left 12/23/2020   Procedure: LEFT BREAST LUMPECTOMY WITH RADIOACTIVE SEED AND LEFT AXILLARY LYMPH NODE BIOPSY;  Surgeon: Rolm Bookbinder, MD;  Location: [place];  Service: General;  Laterality: Left;  PEC BLOCK; START TIME OF 11:30 AM FOR 60 MINUTES ROOM 2 [place] IQ   [place]     laser surgery   LAPAROSCOPIC APPENDECTOMY N/A 05/10/2018   Procedure: APPENDECTOMY LAPAROSCOPIC;  Surgeon: Excell Seltzer, MD;  Location: [facility];  Service: General;  Laterality: N/A;    Vitals:   10/24/21 0854  Weight: 183 lb 2 oz (83.1 kg)     Subjective Assessment - 10/24/21 0854     Subjective Pt returns for her 3 month L-Dex screen.    Pertinent History Patient was diagnosed on 11/15/2020 with left grade II invasive ductal carcinoma breast cancer. Patient underwent a left lumpectomy and sentinel node biopsy (3 negative nodes) on 12/23/2020. It is ER/PR positive and HER2 negative with a Ki67 of 5%.                     L-DEX FLOWSHEETS - 10/24/21 0800       L-DEX LYMPHEDEMA SCREENING   Measurement Type Unilateral    L-DEX MEASUREMENT EXTREMITY Upper Extremity    POSITION  Standing    DOMINANT SIDE Right    At Risk Side Left    BASELINE SCORE (UNILATERAL) 2.2                                  PT Short Term Goals - 05/19/21 0813       PT SHORT TERM GOAL #1   Title independent with initial HEP for pelvic floor strength    Time 4    Period Weeks    Status Achieved               PT Long Term Goals - 06/02/21 8127       PT LONG TERM GOAL #1   Title independent with HEP for pelvic floor and core strength    Time 4    Period Months    Status Achieved      PT LONG TERM GOAL #2   Title vaginal dryness reduced >/= 75% due to using moisturizers to the perineal area and good vaginal health  Time 4    Period Months    Status Achieved      PT LONG TERM GOAL #3   Title able to laugh with a full bladder and not leak urine due to increased pelvic floor strength >/= 3/5 holding for 10 seconds    Time 4    Period Months    Status Achieved                   Plan - 10/24/21 0857     Clinical Impression Statement Pt returns for her 3 month L-Dex screen. Her change from baseline of 1.7 is WNLs so no further treatment is required at this time except to cont every 3 month L-Dex screens which pt is agreeable to.    PT Next Visit Plan Cont every 3 month L-Dex screens for up to 2 years from her SLNB (~12/24/2022)    Consulted and Agree with Plan of Care Patient             Patient will benefit from skilled therapeutic intervention in order to improve the following deficits and impairments:     Visit Diagnosis: Aftercare following surgery for neoplasm     Problem List Patient Active Problem List   Diagnosis Date Noted   Family history of pancreatic cancer 12/16/2020   Genetic testing 12/16/2020   Malignant neoplasm of upper-outer quadrant of left  breast in female, estrogen receptor positive ([place]) 12/03/2020   Appendicitis 05/10/2018   Situational anxiety 04/05/2018   Vitamin D deficiency 04/05/2018   Routine physical examination 12/27/2016   Menopausal symptoms 03/24/2015   Hyperlipidemia 03/24/2015   Seborrheic keratoses 03/24/2015   Weight gain 09/15/2014   Elevated BP 09/15/2014    Otelia Limes, PTA 10/24/2021, 9:01 AM  [facility] @ [place] [place], [place], [address] Phone: [phone]   Fax:  [phone]  Name: Lisa Gallegos MRN: [id number] Date of Birth: [birth year]

## 2021-11-15 ENCOUNTER — Ambulatory Visit: Payer: BC Managed Care – PPO | Admitting: Dermatology

## 2021-11-15 ENCOUNTER — Other Ambulatory Visit: Payer: Self-pay

## 2021-11-15 DIAGNOSIS — D1801 Hemangioma of skin and subcutaneous tissue: Secondary | ICD-10-CM

## 2021-11-15 DIAGNOSIS — L821 Other seborrheic keratosis: Secondary | ICD-10-CM

## 2021-11-15 DIAGNOSIS — Z1283 Encounter for screening for malignant neoplasm of skin: Secondary | ICD-10-CM | POA: Diagnosis not present

## 2021-11-15 DIAGNOSIS — Z853 Personal history of malignant neoplasm of breast: Secondary | ICD-10-CM

## 2021-11-15 DIAGNOSIS — L918 Other hypertrophic disorders of the skin: Secondary | ICD-10-CM | POA: Diagnosis not present

## 2021-11-15 DIAGNOSIS — L988 Other specified disorders of the skin and subcutaneous tissue: Secondary | ICD-10-CM

## 2021-11-15 MED ORDER — TRETINOIN 0.05 % EX CREA
TOPICAL_CREAM | Freq: Every day | CUTANEOUS | 6 refills | Status: AC
Start: 2021-11-15 — End: 2022-11-15

## 2021-11-17 ENCOUNTER — Ambulatory Visit
Admission: RE | Admit: 2021-11-17 | Discharge: 2021-11-17 | Disposition: A | Discharge status: Home / Self Care | Payer: BC Managed Care – PPO | Source: Ambulatory Visit | Attending: Adult Health | Admitting: Adult Health

## 2021-11-17 DIAGNOSIS — Z170 Estrogen receptor positive status [ER+]: Secondary | ICD-10-CM

## 2021-11-17 DIAGNOSIS — C50412 Malignant neoplasm of upper-outer quadrant of left female breast: Secondary | ICD-10-CM

## 2021-11-17 DIAGNOSIS — R922 Inconclusive mammogram: Secondary | ICD-10-CM | POA: Diagnosis not present

## 2021-11-17 HISTORY — DX: Personal history of irradiation: Z92.3

## 2021-11-19 NOTE — Progress Notes (Signed)
Patient Care Team: Burnard Hawthorne, FNP as PCP - General (Family Medicine) Kyung Rudd, MD as Consulting Physician (Radiation Oncology) Rolm Bookbinder, MD as Consulting Physician (General Surgery) Nicholas Lose, MD as Consulting Physician (Hematology and Oncology) Lavonna Monarch, MD as Consulting Physician (Dermatology)  DIAGNOSIS:    ICD-10-CM   1. Malignant neoplasm of upper-outer quadrant of left breast in female, estrogen receptor positive ([place])  C50.412    Z17.0       SUMMARY OF ONCOLOGIC HISTORY: Oncology History  Malignant neoplasm of upper-outer quadrant of left breast in female, estrogen receptor positive ([place])  12/03/2020 Initial Diagnosis   Screening mammogram showed a left breast mass. Diagnostic mammogram and US showed a 0.5cm mass at the 12 o'clock position. Biopsy showed IDC, grade 2, HER-2 equivocal by IHC, negative by FISH (ratio 1.26), ER+ >95%, PR+ 40%, Ki67 5%.   12/15/2020 Cancer Staging   Staging form: Breast, AJCC 8th Edition - Clinical stage from 12/15/2020: Stage IA (cT1a, cN0, cM0, G2, ER+, PR+, HER2-) - Signed by Nicholas Lose, MD on 12/15/2020 Stage prefix: Initial diagnosis Laterality: Left Staged by: Pathologist and managing physician Stage used in treatment planning: Yes National guidelines used in treatment planning: Yes Type of national guideline used in treatment planning: NCCN    12/23/2020 Surgery   Left lumpectomy Donne Hazel): IDC, grade 1, 0.6cm, clear margins, 3 left axillary lymph nodes negative for carcinoma.   01/24/2021 - 02/18/2021 Radiation Therapy   Adjuvant radiation  01/24/2021 through 02/18/2021 Site Technique Total Dose (Gy) Dose per Fx (Gy) Completed Fx Beam Energies  Breast, Left: Breast_Lt 3D 42.56/42.56 2.66 16/16 6X  Breast, Left: Breast_Lt_Bst 3D 8/8 2 4/4 6X    03/2021 -  Anti-estrogen oral therapy   Anastrozole daily     CHIEF COMPLIANT: Follow-up of left breast cancer  INTERVAL HISTORY: Lisa Gallegos is a 66 y.o.  with above-mentioned history of left breast cancer who underwent a lumpectomy and radiation. Mammogram on 11/17/2021 showed no evidence of malignancy. She presents to the clinic today for follow-up.  She complains of stiffness in the hands but otherwise tolerating the treatment extremely well.  Denies any lumps or nodules in the breast.  ALLERGIES:  has No Known Allergies.  MEDICATIONS:  Current Outpatient Medications  Medication Sig Dispense Refill   anastrozole (ARIMIDEX) 1 MG tablet Take 1 tablet (1 mg total) by mouth daily. 90 tablet 3   atorvastatin (LIPITOR) 20 MG tablet Take 1 tablet (20 mg total) by mouth every other day. 45 tablet 3   tretinoin (RETIN-A) 0.05 % cream Apply topically at bedtime. 45 g 6   No current facility-administered medications for this visit.    PHYSICAL EXAMINATION: ECOG PERFORMANCE STATUS: 1 - Symptomatic but completely ambulatory  Vitals:   11/21/21 0935  BP: (!) 144/76  Pulse: 85  Resp: 18  Temp: 98.1 F (36.7 C)  SpO2: 100%   Filed Weights   11/21/21 0935  Weight: 181 lb (82.1 kg)    BREAST: No palpable masses or nodules in either right or left breasts. No palpable axillary supraclavicular or infraclavicular adenopathy no breast tenderness or nipple discharge. (exam performed in the presence of a chaperone)  LABORATORY DATA:  I have reviewed the data as listed CMP Latest Ref Rng & Units 12/15/2020 05/09/2018 04/05/2018  Glucose 70 - 99 mg/dL 98 125(H) 100(H)  BUN 8 - 23 mg/dL '18 20 19  ' Creatinine 0.44 - 1.00 mg/dL 0.88 0.96 0.84  Sodium 135 - 145 mmol/L 141 136 140  Potassium 3.5 - 5.1 mmol/L 4.5 3.7 4.4  Chloride 98 - 111 mmol/L 105 102 104  CO2 22 - 32 mmol/L '28 24 30  ' Calcium 8.9 - 10.3 mg/dL 9.5 9.2 9.8  Total Protein 6.5 - 8.1 g/dL 7.5 7.3 7.6  Total Bilirubin 0.3 - 1.2 mg/dL 0.4 0.6 0.4  Alkaline Phos 38 - 126 U/L 98 81 84  AST 15 - 41 U/L '26 21 18  ' ALT 0 - 44 U/L 36 17 16    Lab Results  Component Value Date   WBC 6.4  12/15/2020   HGB 13.6 12/15/2020   HCT 42.5 12/15/2020   MCV 88.7 12/15/2020   PLT 267 12/15/2020   NEUTROABS 3.1 12/15/2020    ASSESSMENT & PLAN:  Malignant neoplasm of upper-outer quadrant of left breast in female, estrogen receptor positive ([place]) 12/23/2020: Left lumpectomy Donne Hazel): IDC, grade 1, 0.6cm, clear margins, 3 left axillary lymph nodes negative for carcinoma.HER-2 equivocal by IHC, negative by FISH (ratio 1.26), ER+ >95%, PR+ 40%, Ki67 5%   Recommendation: 1. Adjuvant radiation therapy 01/25/21- 02/18/21 2. Adjuvant antiestrogen therapy with anastrozole started 03/16/2021   Anastrozole toxicities: Joint stiffness especially in the hands in the morning.  Gets better through the day.  I encouraged her to take turmeric for anti-inflammatory effect and also see if she wants to switch the time of the day to nighttime.  If none of this works then we can talk about switching her from anastrozole to letrozole.  Breast cancer surveillance: 1.  Breast exam 11/21/2021: Benign 2. mammogram 11/17/2021: Benign breast density category B  Return to clinic in 1 year for follow-up    No orders of the defined types were placed in this encounter.  The patient has a good understanding of the overall plan. she agrees with it. she will call with any problems that may develop before the next visit here.  Total time spent: 20 mins including face to face time and time spent for planning, charting and coordination of care  Rulon Eisenmenger, MD, MPH 11/21/2021  I, Thana Ates, am acting as scribe for Dr. Nicholas Lose.  I have reviewed the above documentation for accuracy and completeness, and I agree with the above.

## 2021-11-21 ENCOUNTER — Other Ambulatory Visit: Payer: Self-pay

## 2021-11-21 ENCOUNTER — Ambulatory Visit: Payer: BC Managed Care – PPO | Admitting: Hematology and Oncology

## 2021-11-21 ENCOUNTER — Inpatient Hospital Stay: Payer: BC Managed Care – PPO | Attending: Hematology and Oncology | Admitting: Hematology and Oncology

## 2021-11-21 DIAGNOSIS — C50412 Malignant neoplasm of upper-outer quadrant of left female breast: Secondary | ICD-10-CM | POA: Diagnosis not present

## 2021-11-21 DIAGNOSIS — Z79899 Other long term (current) drug therapy: Secondary | ICD-10-CM | POA: Insufficient documentation

## 2021-11-21 DIAGNOSIS — Z79811 Long term (current) use of aromatase inhibitors: Secondary | ICD-10-CM | POA: Insufficient documentation

## 2021-11-21 DIAGNOSIS — Z170 Estrogen receptor positive status [ER+]: Secondary | ICD-10-CM

## 2021-11-21 DIAGNOSIS — Z923 Personal history of irradiation: Secondary | ICD-10-CM | POA: Diagnosis not present

## 2021-11-21 MED ORDER — ANASTROZOLE 1 MG PO TABS: 1.0000 mg | ORAL_TABLET | Freq: Every day | ORAL | 3 refills | Status: DC

## 2021-11-21 NOTE — Assessment & Plan Note (Signed)
12/23/2020:Left lumpectomy Donne Hazel): IDC, grade 1, 0.6cm, clear margins, 3 left axillary lymph nodes negative for carcinoma.HER-2 equivocal by IHC, negative by FISH (ratio 1.26), ER+ >95%, PR+ 40%, Ki67 5%  Recommendation: 1.Adjuvant radiation therapy 01/25/21- 02/18/21 2.Adjuvant antiestrogen therapy with anastrozole started 03/16/2021  Anastrozole toxicities:  Breast cancer surveillance: 1.  Breast exam 11/21/2021: Benign 2. mammogram 11/17/2021: Benign breast density category B  Return to clinic in 1 year for follow-up

## 2021-11-22 ENCOUNTER — Telehealth: Payer: Self-pay | Admitting: Hematology and Oncology

## 2021-11-22 NOTE — Telephone Encounter (Signed)
Scheduled appointment per 2/6 los. Patient is aware. Patient will be mailed an updated calendar.

## 2021-12-09 ENCOUNTER — Encounter: Payer: Self-pay | Admitting: Dermatology

## 2021-12-09 NOTE — Progress Notes (Signed)
° °  Follow-Up Visit   Subjective  Lisa Gallegos is a 66 y.o. female who presents for the following: Annual Exam (Pt here for annual. Pt has spots on the back she needs evaluated ).  General skin examination, several spots on back to check Location:  Duration:  Quality:  Associated Signs/Symptoms: Modifying Factors:  Severity:  Timing: Context:   Objective  Well appearing patient in no apparent distress; mood and affect are within normal limits. Scalp General skin examination: No atypical pigmented lesions or nonmelanoma skin cancer.   Left Inframammary Fold, Left Postauricular Area, Left Zygomatic Area, Right Inframammary Fold, Torso - Posterior (Back) Multiple 4 to 6 mm brown flattopped textured papules with typical dermoscopy  Left Anterior Neck Pedunculated 1 mm flesh-colored papule  Chest (Upper Torso, Anterior) Smooth red 1 mm dermal papules    A full examination was performed including scalp, head, eyes, ears, nose, lips, neck, chest, axillae, abdomen, back, buttocks, bilateral upper extremities, bilateral lower extremities, hands, feet, fingers, toes, fingernails, and toenails. All findings within normal limits unless otherwise noted below.  Areas beneath undergarments not fully examined   Assessment & Plan    Screening exam for skin cancer Scalp  Annual skin examination.  Encouraged to self examine twice annually.  Continue ultraviolet protection.  Seborrheic keratosis (5) Torso - Posterior (Back); Left Inframammary Fold; Right Inframammary Fold; Left Zygomatic Area; Left Postauricular Area  Leave if stable  Skin tag Left Anterior Neck  May choose future removal  Cherry angioma Chest (Upper Torso, Anterior)  No intervention necessary  Rhytides Head - Anterior (Face)  Related Medications tretinoin (RETIN-A) 0.05 % cream Apply topically at bedtime.      I, Lavonna Monarch, MD, have reviewed all documentation for this visit.  The documentation on  12/09/21 for the exam, diagnosis, procedures, and orders are all accurate and complete.

## 2022-01-04 DIAGNOSIS — H31002 Unspecified chorioretinal scars, left eye: Secondary | ICD-10-CM | POA: Diagnosis not present

## 2022-01-04 DIAGNOSIS — H5203 Hypermetropia, bilateral: Secondary | ICD-10-CM | POA: Diagnosis not present

## 2022-01-26 DIAGNOSIS — Z6830 Body mass index (BMI) 30.0-30.9, adult: Secondary | ICD-10-CM | POA: Diagnosis not present

## 2022-01-26 DIAGNOSIS — C50919 Malignant neoplasm of unspecified site of unspecified female breast: Secondary | ICD-10-CM | POA: Diagnosis not present

## 2022-01-26 DIAGNOSIS — Z01419 Encounter for gynecological examination (general) (routine) without abnormal findings: Secondary | ICD-10-CM | POA: Diagnosis not present

## 2022-01-26 DIAGNOSIS — F411 Generalized anxiety disorder: Secondary | ICD-10-CM | POA: Diagnosis not present

## 2022-02-10 ENCOUNTER — Encounter: Payer: Self-pay | Admitting: Physical Therapy

## 2022-02-23 ENCOUNTER — Encounter (HOSPITAL_COMMUNITY): Payer: Self-pay

## 2022-02-23 NOTE — Progress Notes (Signed)
?Cardiology Office Note:   ?Date:  02/24/2022  ?NAME:  Lisa Gallegos    ?MRN: [id number] ?DOB:  Nov 05, 1955  ? ?PCP:  Burnard Hawthorne, FNP  ?Cardiologist:  None  ?Electrophysiologist:  None  ? ?Referring MD: Burnard Hawthorne, FNP  ? ?Chief Complaint  ?Patient presents with  ? Follow-up  ?   ?  ? ?History of Present Illness:   ?Lisa Gallegos is a 66 y.o. female with a hx of history of breast cancer who presents for follow-up of elevated coronary calcium score.  Started on Lipitor 20 mg every other day.  Reports she has noticed some achiness in her fingers.  Does not appear to have proximal muscle weakness.  Also on anastrozole.  She attributes a lot of this to that.  She reports she is working on diet and exercise.  Seems to doing well.  She retired in January.  Overall doing quite well.  Denies any chest pain or trouble breathing.  We did discuss reducing the dose of Lipitor to see if this helps.  Neck step would be to try Crestor.  She is okay to do this.  Need to recheck her cholesterol.  Suspect that will be much improved.  Given her LDL cholesterol was not that bad.  Overall doing well.  Blood pressure slightly elevated but continue to monitor this. ? ?Problem List ?Stage IA ductal CA of L breast (ER+, PR+, HER2-) ?-12/03/2020 ?-L Lumpectomy 12/23/2020 ?-Adjuvant Radiation (01/25/2021) ?-adjuvant antiestrogen therapy (03/16/2021) planned for 5 years  ?2. HLD ?-Total cholesterol 239, HDL 41, LDL 168, TG 163 ?3. Coronary calcium ?-CAC score 105 (85th percentile) ?-located in LAD ?-Fam HX (Father in 35s CABG) ? ?Past Medical History: ?Past Medical History:  ?Diagnosis Date  ? Anxiety   ? Cancer ([place]) 11/2020  ? left breast IDC  ? Family history of pancreatic cancer 12/16/2020  ? Personal history of radiation therapy   ? ? ?Past Surgical History: ?Past Surgical History:  ?Procedure Laterality Date  ? APPENDECTOMY    ? BREAST LUMPECTOMY WITH RADIOACTIVE SEED AND AXILLARY LYMPH NODE DISSECTION Left 12/23/2020  ? Procedure:  LEFT BREAST LUMPECTOMY WITH RADIOACTIVE SEED AND LEFT AXILLARY LYMPH NODE BIOPSY;  Surgeon: Rolm Bookbinder, MD;  Location: [facility];  Service: General;  Laterality: Left;  PEC BLOCK; START TIME OF 11:30 AM FOR 60 MINUTES ROOM 2 [place] IQ  ? [place]  ? FTP  ? KIDNEY STONE SURGERY    ? laser surgery  ? LAPAROSCOPIC APPENDECTOMY N/A 05/10/2018  ? Procedure: APPENDECTOMY LAPAROSCOPIC;  Surgeon: Excell Seltzer, MD;  Location: [facility];  Service: General;  Laterality: N/A;  ? ? ?Current Medications: ?Current Meds  ?Medication Sig  ? anastrozole (ARIMIDEX) 1 MG tablet Take 1 tablet (1 mg total) by mouth daily.  ? Cholecalciferol (VITAMIN D3) 125 MCG (5000 UT) TABS Take 5,000 Units by mouth daily in the afternoon.  ? Coenzyme Q10 (CO Q 10 PO) Take 200 mg by mouth daily in the afternoon.  ? Cyanocobalamin (VITAMIN B 12 PO) Take 50 mcg by mouth daily in the afternoon.  ? Magnesium 400 MG TABS Take 400 mg by mouth daily in the afternoon.  ? RA TURMERIC EXTRA STRENGTH PO Take 500 mg by mouth daily.  ? tretinoin (RETIN-A) 0.05 % cream Apply topically at bedtime.  ? [DISCONTINUED] atorvastatin (LIPITOR) 20 MG tablet Take 1 tablet (20 mg total) by mouth every other day.  ?  ? ?Allergies:    ?Patient  has no known allergies.  ? ?Social History: ?Social History  ? ?Socioeconomic History  ? Marital status: Single  ?  Spouse name: Not on file  ? Number of children: 2  ? Years of education: Not on file  ? Highest education level: Not on file  ?Occupational History  ? Not on file  ?Tobacco Use  ? Smoking status: Never  ? Smokeless tobacco: Never  ?Substance and Sexual Activity  ? Alcohol use: Not Currently  ?  Alcohol/week: 0.0 standard drinks  ? Drug use: No  ? Sexual activity: Yes  ?  Birth control/protection: Post-menopausal  ?Other Topics Concern  ? Not on file  ?Social History Narrative  ? Lives in [place] with fiance. 2 dogs   ?   ? Work - Personal assistant.  ?   ? Diet -  chicken and fish with veggies, avoids red meat. Vegetarian to bring cholesterol down as doesn't want to be treated with cholesterol medications if she can avoid it.   ?   ? ?Social Determinants of Health  ? ?Financial Resource Strain: Not on file  ?Food Insecurity: Not on file  ?Transportation Needs: Not on file  ?Physical Activity: Not on file  ?Stress: Not on file  ?Social Connections: Not on file  ?  ? ?Family History: ?The patient's family history includes Heart disease in her father; Lung cancer in her maternal uncle; Pancreatic cancer (age of onset: 38) in her mother. There is no history of Breast cancer. ? ?ROS:   ?All other ROS reviewed and negative. Pertinent positives noted in the HPI.    ? ?EKGs/Labs/Other Studies Reviewed:   ?The following studies were personally reviewed by me today: ? ? ?Recent Labs: ?No results found for requested labs within last 8760 hours.  ? ?Recent Lipid Panel ?   ?Component Value Date/Time  ? CHOL 239 (H) 06/30/2021 0845  ? TRIG 163 (H) 06/30/2021 0845  ? HDL 41 06/30/2021 0845  ? CHOLHDL 5.8 (H) 06/30/2021 0845  ? CHOLHDL 4 04/05/2018 0904  ? VLDL 18.6 04/05/2018 0904  ? [place] 168 (H) 06/30/2021 0845  ? ? ?Physical Exam:   ?VS:  BP (!) 136/92 (BP Location: Right Arm, Patient Position: Sitting)   Pulse 87   Ht '5\' 5"'  (1.651 m)   Wt 180 lb 3.2 oz (81.7 kg)   LMP 02/17/2012   SpO2 96%   BMI 29.99 kg/m?    ?Wt Readings from Last 3 Encounters:  ?02/24/22 180 lb 3.2 oz (81.7 kg)  ?11/21/21 181 lb (82.1 kg)  ?10/24/21 183 lb 2 oz (83.1 kg)  ?  ?General: Well nourished, well developed, in no acute distress ?Head: Atraumatic, normal size  ?Eyes: PEERLA, EOMI  ?Neck: Supple, no JVD ?Endocrine: No thryomegaly ?Cardiac: Normal S1, S2; RRR; no murmurs, rubs, or gallops ?Lungs: Clear to auscultation bilaterally, no wheezing, rhonchi or rales  ?Abd: Soft, nontender, no hepatomegaly  ?Ext: No edema, pulses 2+ ?Musculoskeletal: No deformities, BUE and BLE strength normal and equal ?Skin:  Warm and dry, no rashes   ?Neuro: Alert and oriented to person, place, time, and situation, CNII-XII grossly intact, no focal deficits  ?Psych: Normal mood and affect  ? ?ASSESSMENT:   ?Lisa Gallegos is a 66 y.o. female who presents for the following: ?1. Agatston coronary artery calcium score between 100 and 199   ?2. Mixed hyperlipidemia   ? ? ?PLAN:   ?1. Agatston coronary artery calcium score between 100 and 199 ?2. Mixed hyperlipidemia ?-Coronary calcium  score 105 which is 85th percentile.  Lipid-lowering agents were recommended.  Started on Lipitor 20 mg every other day.  Reports some cramping in her fingers.  I not believe this is related to her statin but we can reduce the dose to 10 mg every other day to see if this helps.  She is also on anastrozole for breast cancer.  This could be contributing.  She has been on a statin for several months.  We will recheck it next week when she is fasting.  Denies any chest pain or trouble breathing.  We will continue to work towards an appropriate statin dose.  May end up on a nonstatin medication.  Overall doing well.  LDL goal close to 70. ? ?Disposition: Return in about 1 year (around 02/25/2023). ? ?Medication Adjustments/Labs and Tests Ordered: ?Current medicines are reviewed at length with the patient today.  Concerns regarding medicines are outlined above.  ?Orders Placed This Encounter  ?Procedures  ? Lipid panel  ? ?Meds ordered this encounter  ?Medications  ? atorvastatin (LIPITOR) 10 MG tablet  ?  Sig: Take 1 tablet (10 mg total) by mouth every other day.  ?  Dispense:  45 tablet  ?  Refill:  3  ? ? ?Patient Instructions  ?Medication Instructions:  ?Decrease Lipitor to 10 mg every other day.  ? ?*If you need a refill on your cardiac medications before your next appointment, please call your pharmacy* ? ? ?Lab Work: ?LIPID (next week, come back fasting nothing to eat or drink, no lab appointment needed) ? ?If you have labs (blood work) drawn today and your tests  are completely normal, you will receive your results only by: ?MyChart Message (if you have MyChart) OR ?A paper copy in the mail ?If you have any lab test that is abnormal or we need to change your treatm

## 2022-02-24 ENCOUNTER — Ambulatory Visit (INDEPENDENT_AMBULATORY_CARE_PROVIDER_SITE_OTHER): Payer: BC Managed Care – PPO | Admitting: Cardiovascular Disease

## 2022-02-24 ENCOUNTER — Encounter: Payer: Self-pay | Admitting: Cardiovascular Disease

## 2022-02-24 VITALS — BP 136/92 | HR 87 | Ht 65.0 in | Wt 180.2 lb

## 2022-02-24 DIAGNOSIS — E782 Mixed hyperlipidemia: Secondary | ICD-10-CM

## 2022-02-24 DIAGNOSIS — R931 Abnormal findings on diagnostic imaging of heart and coronary circulation: Secondary | ICD-10-CM | POA: Diagnosis not present

## 2022-02-24 MED ORDER — ATORVASTATIN CALCIUM 10 MG PO TABS: 10.0000 mg | ORAL_TABLET | ORAL | 3 refills | Status: DC

## 2022-02-24 NOTE — Patient Instructions (Signed)
Medication Instructions:  ?Decrease Lipitor to 10 mg every other day.  ? ?*If you need a refill on your cardiac medications before your next appointment, please call your pharmacy* ? ? ?Lab Work: ?LIPID (next week, come back fasting nothing to eat or drink, no lab appointment needed) ? ?If you have labs (blood work) drawn today and your tests are completely normal, you will receive your results only by: ?MyChart Message (if you have MyChart) OR ?A paper copy in the mail ?If you have any lab test that is abnormal or we need to change your treatment, we will call you to review the results. ? ? ?Follow-Up: ?At [facility], you and your health needs are our priority.  As part of our continuing mission to provide you with exceptional heart care, we have created designated Provider Care Teams.  These Care Teams include your primary Cardiologist (physician) and Advanced Practice Providers (APPs -  Physician Assistants and Nurse Practitioners) who all work together to provide you with the care you need, when you need it. ? ?We recommend signing up for the patient portal called "MyChart".  Sign up information is provided on this After Visit Summary.  MyChart is used to connect with patients for Virtual Visits (Telemedicine).  Patients are able to view lab/test results, encounter notes, upcoming appointments, etc.  Non-urgent messages can be sent to your provider as well.   ?To learn more about what you can do with MyChart, go to [email or web address].   ? ?Your next appointment:   ?12 month(s) ? ?The format for your next appointment:   ?In Person ? ?Provider:   ?Eleonore Chiquito, MD ? ? ? ? ? ? ? ? ?

## 2022-03-06 ENCOUNTER — Ambulatory Visit: Payer: BC Managed Care – PPO | Attending: General Surgery

## 2022-03-06 ENCOUNTER — Ambulatory Visit: Payer: BC Managed Care – PPO | Admitting: Family

## 2022-03-06 ENCOUNTER — Encounter: Payer: Self-pay | Admitting: Family

## 2022-03-06 VITALS — BP 122/80 | HR 72 | Temp 98.5°F | Resp 16 | Ht 65.0 in | Wt 179.4 lb

## 2022-03-06 VITALS — Wt 180.2 lb

## 2022-03-06 DIAGNOSIS — M25541 Pain in joints of right hand: Secondary | ICD-10-CM

## 2022-03-06 DIAGNOSIS — E782 Mixed hyperlipidemia: Secondary | ICD-10-CM

## 2022-03-06 DIAGNOSIS — R931 Abnormal findings on diagnostic imaging of heart and coronary circulation: Secondary | ICD-10-CM | POA: Diagnosis not present

## 2022-03-06 DIAGNOSIS — F418 Other specified anxiety disorders: Secondary | ICD-10-CM

## 2022-03-06 DIAGNOSIS — M25542 Pain in joints of left hand: Secondary | ICD-10-CM

## 2022-03-06 DIAGNOSIS — Z483 Aftercare following surgery for neoplasm: Secondary | ICD-10-CM | POA: Insufficient documentation

## 2022-03-06 DIAGNOSIS — Z1379 Encounter for other screening for genetic and chromosomal anomalies: Secondary | ICD-10-CM

## 2022-03-06 DIAGNOSIS — R7982 Elevated C-reactive protein (CRP): Secondary | ICD-10-CM

## 2022-03-06 DIAGNOSIS — L821 Other seborrheic keratosis: Secondary | ICD-10-CM

## 2022-03-06 DIAGNOSIS — Z853 Personal history of malignant neoplasm of breast: Secondary | ICD-10-CM

## 2022-03-06 DIAGNOSIS — Z8261 Family history of arthritis: Secondary | ICD-10-CM

## 2022-03-06 DIAGNOSIS — E538 Deficiency of other specified B group vitamins: Secondary | ICD-10-CM

## 2022-03-06 DIAGNOSIS — Z79899 Other long term (current) drug therapy: Secondary | ICD-10-CM | POA: Insufficient documentation

## 2022-03-06 DIAGNOSIS — E559 Vitamin D deficiency, unspecified: Secondary | ICD-10-CM

## 2022-03-06 NOTE — Progress Notes (Unsigned)
Established Patient Office Visit  Subjective:  Patient ID: Lisa Gallegos, female    DOB: [birth year]  Age: 66 y.o. MRN: [id number]  CC:  Chief Complaint  Patient presents with   Establish Care    HPI Lisa Gallegos is here for a transition of care visit.  Prior provider was: Lisa Schmid NP Pt is without acute concerns.   Does have trigger finger right hand as well as aching of bil hands with stiffiness in the am.   Left arm, lymph nodes removed, when sleeping has numbness and tingling of her hand. She tried to use a carpal tunnel sleeve at night and this has completely resolved her issues.   Pap: sees gyn, had recent appt in feb. Wasn't due yet.  Mammogram: 11/17/21, stable. Annually.  Bone density: May 2022 negative for pt.  Colonoscopy: due 09/15/22  chronic concerns:  Wt Readings from Last 3 Encounters:  03/06/22 179 lb 6 oz (81.4 kg)  03/06/22 180 lb 4 oz (81.8 kg)  02/24/22 180 lb 3.2 oz (81.7 kg)   Stubborn weight loss, upper lower body and walks on treadmill. Just started again last week.   Breast cancer left cancer, ER positive: lumpectomy and radiation. 20 radiation tx. Currently on anastrozole. Current f/u with oncologist once yearly, last seen in February. Lisa Gallegos.   Sees Lisa Gallegos, Dermatology, for annual screening for skin. Did have h/o seborrheic keratosis.   Elevated calcium score, saw cardiologist started on lipitor 10 mg every other day. Follows with them once daily. Lisa Gallegos.   Situational anxiety: alprazolam, only situational mainly when she flies.   Past Medical History:  Diagnosis Date   Anxiety    Cancer ([place]) 11/2020   left breast IDC   Family history of pancreatic cancer 12/16/2020   Personal history of radiation therapy     Past Surgical History:  Procedure Laterality Date   APPENDECTOMY     BREAST LUMPECTOMY WITH RADIOACTIVE SEED AND AXILLARY LYMPH NODE DISSECTION Left 12/23/2020   Procedure: LEFT BREAST LUMPECTOMY WITH  RADIOACTIVE SEED AND LEFT AXILLARY LYMPH NODE BIOPSY;  Surgeon: Lisa Bookbinder, MD;  Location: [facility];  Service: General;  Laterality: Left;  PEC BLOCK; START TIME OF 11:30 AM FOR 37 MINUTES ROOM 2 [place] IQ   [place] SURGERY     laser surgery   LAPAROSCOPIC APPENDECTOMY N/A 05/10/2018   Procedure: APPENDECTOMY LAPAROSCOPIC;  Surgeon: Lisa Seltzer, MD;  Location: [facility];  Service: General;  Laterality: N/A;    Family History  Problem Relation Age of Onset   Pancreatic cancer Mother 64   Heart disease Father    Lung cancer Maternal Uncle        dx after 50, smoking hx   Breast cancer Neg Hx     Social History   Socioeconomic History   Marital status: Single    Spouse name: Not on file   Number of children: 2   Years of education: Not on file   Highest education level: Not on file  Occupational History   Not on file  Tobacco Use   Smoking status: Never   Smokeless tobacco: Never  Substance and Sexual Activity   Alcohol use: Not Currently    Alcohol/week: 0.0 standard drinks   Drug use: No   Sexual activity: Yes    Birth control/protection: Post-menopausal  Other Topics Concern   Not on file  Social History Narrative   Lives  in [place] with fiance. 2 dogs       Work - Personal assistant.      Diet - chicken and fish with veggies, avoids red meat. Vegetarian to bring cholesterol down as doesn't want to be treated with cholesterol medications if she can avoid it.       Social Determinants of Health   Financial Resource Strain: Not on file  Food Insecurity: Not on file  Transportation Needs: Not on file  Physical Activity: Not on file  Stress: Not on file  Social Connections: Not on file  Intimate Partner Violence: Not on file    Outpatient Medications Prior to Visit  Medication Sig Dispense Refill   anastrozole (ARIMIDEX) 1 MG tablet Take 1 tablet (1 mg total) by mouth daily. 90  tablet 3   atorvastatin (LIPITOR) 10 MG tablet Take 1 tablet (10 mg total) by mouth every other day. 45 tablet 3   Cholecalciferol (VITAMIN D3) 125 MCG (5000 UT) TABS Take 5,000 Units by mouth daily in the afternoon.     Coenzyme Q10 (CO Q 10 PO) Take 200 mg by mouth daily in the afternoon.     Cyanocobalamin (VITAMIN B 12 PO) Take 50 mcg by mouth daily in the afternoon.     Magnesium 400 MG TABS Take 400 mg by mouth daily in the afternoon.     Omega-3 1000 MG CAPS Take by mouth. Algae omega     RA TURMERIC EXTRA STRENGTH PO Take 500 mg by mouth daily.     tretinoin (RETIN-A) 0.05 % cream Apply topically at bedtime. 45 g 6   ALPRAZolam (XANAX) 0.5 MG tablet Take 0.5 mg by mouth as needed. (Patient not taking: Reported on 03/06/2022)     No facility-administered medications prior to visit.    No Known Allergies  ROS Review of Systems  Review of Systems  Respiratory:  Negative for shortness of breath.   Cardiovascular:  Negative for chest pain and palpitations.  Gastrointestinal:  Negative for constipation and diarrhea.  Genitourinary:  Negative for dysuria, frequency and urgency.  Musculoskeletal:  Negative for myalgias.  Psychiatric/Behavioral:  Negative for depression and suicidal ideas.   All other systems reviewed and are negative.    Objective:    Physical Exam  Gen: NAD, resting comfortably CV: RRR with no murmurs appreciated Pulm: NWOB, CTAB with no crackles, wheezes, or rhonchi Skin: warm, dry Psych: Normal affect and thought content  BP 122/80   Pulse 72   Temp 98.5 F (36.9 C)   Resp 16   Ht '5\' 5"'$  (1.651 m)   Wt 179 lb 6 oz (81.4 kg)   LMP 02/17/2012   SpO2 98%   BMI 29.85 kg/m  Wt Readings from Last 3 Encounters:  03/06/22 179 lb 6 oz (81.4 kg)  03/06/22 180 lb 4 oz (81.8 kg)  02/24/22 180 lb 3.2 oz (81.7 kg)     Health Maintenance Due  Topic Date Due   COVID-19 Vaccine (1) Never done   Zoster Vaccines- Shingrix (1 of 2) Never done   PAP  SMEAR-Modifier  07/16/2017    There are no preventive care reminders to display for this patient.  Lab Results  Component Value Date   TSH 1.63 04/05/2018   Lab Results  Component Value Date   WBC 6.4 12/15/2020   HGB 13.6 12/15/2020   HCT 42.5 12/15/2020   MCV 88.7 12/15/2020   PLT 267 12/15/2020   Lab Results  Component Value Date  NA 141 12/15/2020   K 4.5 12/15/2020   CO2 28 12/15/2020   GLUCOSE 98 12/15/2020   BUN 18 12/15/2020   CREATININE 0.88 12/15/2020   BILITOT 0.4 12/15/2020   ALKPHOS 98 12/15/2020   AST 26 12/15/2020   ALT 36 12/15/2020   PROT 7.5 12/15/2020   ALBUMIN 4.2 12/15/2020   CALCIUM 9.5 12/15/2020   ANIONGAP 8 12/15/2020   GFR 73.42 04/05/2018   Lab Results  Component Value Date   CHOL 239 (H) 06/30/2021   Lab Results  Component Value Date   HDL 41 06/30/2021   Lab Results  Component Value Date   LDLCALC 168 (H) 06/30/2021   Lab Results  Component Value Date   TRIG 163 (H) 06/30/2021   Lab Results  Component Value Date   CHOLHDL 5.8 (H) 06/30/2021   Lab Results  Component Value Date   HGBA1C 5.7 01/02/2017      Assessment & Plan:   Problem List Items Addressed This Visit   None   No orders of the defined types were placed in this encounter.   Follow-up: No follow-ups on file.    Eugenia Pancoast, FNP

## 2022-03-06 NOTE — Therapy (Signed)
  OUTPATIENT PHYSICAL THERAPY SOZO SCREENING NOTE   Patient Name: Lisa Gallegos MRN: [id number] DOB:Oct 23, 1955, 66 y.o., female Today's Date: 03/06/2022  PCP: Burnard Hawthorne, FNP REFERRING PROVIDER: Rolm Bookbinder, MD   PT End of Session - 03/06/22 0919     Visit Number 4   # unchanged due to screen only   PT Start Time 0917    PT Stop Time 0921    PT Time Calculation (min) 4 min    Activity Tolerance Patient tolerated treatment well    Behavior During Therapy [facility] for tasks assessed/performed             Past Medical History:  Diagnosis Date   Anxiety    Cancer ([place]) 11/2020   left breast IDC   Family history of pancreatic cancer 12/16/2020   Personal history of radiation therapy    Past Surgical History:  Procedure Laterality Date   APPENDECTOMY     BREAST LUMPECTOMY WITH RADIOACTIVE SEED AND AXILLARY LYMPH NODE DISSECTION Left 12/23/2020   Procedure: LEFT BREAST LUMPECTOMY WITH RADIOACTIVE SEED AND LEFT AXILLARY LYMPH NODE BIOPSY;  Surgeon: Rolm Bookbinder, MD;  Location: [facility];  Service: General;  Laterality: Left;  PEC BLOCK; START TIME OF 11:30 AM FOR 60 MINUTES ROOM 2 [place] IQ   CESAREAN SECTION  1985   FTP   KIDNEY STONE SURGERY     laser surgery   LAPAROSCOPIC APPENDECTOMY N/A 05/10/2018   Procedure: APPENDECTOMY LAPAROSCOPIC;  Surgeon: Excell Seltzer, MD;  Location: [place];  Service: General;  Laterality: N/A;   Patient Active Problem List   Diagnosis Date Noted   Family history of pancreatic cancer 12/16/2020   Genetic testing 12/16/2020   Malignant neoplasm of upper-outer quadrant of left breast in female, estrogen receptor positive ([place]) 12/03/2020   Appendicitis 05/10/2018   Situational anxiety 04/05/2018   Vitamin D deficiency 04/05/2018   Routine physical examination 12/27/2016   Menopausal symptoms 03/24/2015   Hyperlipidemia 03/24/2015   Seborrheic keratoses 03/24/2015   Weight gain 09/15/2014   Elevated BP  09/15/2014    REFERRING DIAG: left breast cancer at risk for lymphedema  THERAPY DIAG:  Aftercare following surgery for neoplasm  PERTINENT HISTORY: Patient was diagnosed on 11/15/2020 with left grade II invasive ductal carcinoma breast cancer. Patient underwent a left lumpectomy and sentinel node biopsy (3 negative nodes) on 12/23/2020. It is ER/PR positive and HER2 negative with a Ki67 of 5%.   PRECAUTIONS: left UE Lymphedema risk, None  SUBJECTIVE: Pt returns for her 3 month L-Dex screen.   PAIN:  Are you having pain? No  SOZO SCREENING: Patient was assessed today using the SOZO machine to determine the lymphedema index score. This was compared to her baseline score. It was determined that she is within the recommended range when compared to her baseline and no further action is needed at this time. She will continue SOZO screenings. These are done every 3 months for 2 years post operatively followed by every 6 months for 2 years, and then annually.    Otelia Limes, PTA 03/06/2022, 9:21 AM

## 2022-03-06 NOTE — Patient Instructions (Signed)
Welcome to our clinic, I am happy to have you as my new patient. I am excited to continue on this healthcare journey with you.   I have created an order for lab work today during our visit.  Please schedule an appointment on your way out to return to the lab at your convenience. Please return fasting at your lab appointment (meaning you can only drink black coffee and or water prior to your appointment). I will reach out to you in regards to the labs when I receive the results.      Please keep in mind Any my chart messages you send have up to a three business day turnaround for a response.  Phone calls may take up to a one full business day turnaround for a  response.   If you need a medication refill I recommend you request it through the pharmacy as this is easiest for [name] rather than sending a message and or phone call.   Due to recent changes in healthcare laws, you may see results of your imaging and/or laboratory studies on MyChart before I have had a chance to review them.  I understand that in some cases there may be results that are confusing or concerning to you. Please understand that not all results are received at the same time and often I may need to interpret multiple results in order to provide you with the best plan of care or course of treatment. Therefore, I ask that you please give me 2 business days to thoroughly review all your results before contacting my office for clarification. Should we see a critical lab result, you will be contacted sooner.   It was a pleasure seeing you today! Please do not hesitate to reach out with any questions and or concerns.  Regards,   Eugenia Pancoast FNP-C

## 2022-03-07 DIAGNOSIS — M25541 Pain in joints of right hand: Secondary | ICD-10-CM | POA: Insufficient documentation

## 2022-03-07 NOTE — Assessment & Plan Note (Signed)
cmp ordered pending results 

## 2022-03-07 NOTE — Assessment & Plan Note (Signed)
Reviewed from cardiology note Will continue to monitor lipid panel  Continue lipitor 20 mg once daily  Work on low chol diet

## 2022-03-07 NOTE — Assessment & Plan Note (Signed)
Ordered vitamin d pending results.   

## 2022-03-07 NOTE — Assessment & Plan Note (Signed)
Continue anastrazole Cont f/u with oncologist as scheudled

## 2022-03-07 NOTE — Assessment & Plan Note (Signed)
Continue atorvastatin 20 mg  Ordered lipid panel, pending results. Work on low cholesterol diet and exercise as tolerated  

## 2022-03-07 NOTE — Assessment & Plan Note (Signed)
Repeat crp pending results

## 2022-03-07 NOTE — Assessment & Plan Note (Signed)
Ordering rf and ana due to pt stiffness of hands Pending results

## 2022-03-07 NOTE — Assessment & Plan Note (Signed)
b12 folate ordered pending results

## 2022-03-07 NOTE — Assessment & Plan Note (Signed)
Alprazolam prn  Work on anxiety reducing techniques

## 2022-03-07 NOTE — Assessment & Plan Note (Signed)
Order ana rf crp  Likely due to anastrazole Trial without lipitor x 4 days to see if contributing if no improvement restart statin If improvement let me know, consider crestor

## 2022-03-10 ENCOUNTER — Other Ambulatory Visit (INDEPENDENT_AMBULATORY_CARE_PROVIDER_SITE_OTHER): Payer: BC Managed Care – PPO

## 2022-03-10 DIAGNOSIS — E538 Deficiency of other specified B group vitamins: Secondary | ICD-10-CM

## 2022-03-10 DIAGNOSIS — R7982 Elevated C-reactive protein (CRP): Secondary | ICD-10-CM

## 2022-03-10 DIAGNOSIS — E559 Vitamin D deficiency, unspecified: Secondary | ICD-10-CM

## 2022-03-10 DIAGNOSIS — E782 Mixed hyperlipidemia: Secondary | ICD-10-CM

## 2022-03-10 DIAGNOSIS — M25542 Pain in joints of left hand: Secondary | ICD-10-CM | POA: Diagnosis not present

## 2022-03-10 DIAGNOSIS — Z8261 Family history of arthritis: Secondary | ICD-10-CM

## 2022-03-10 DIAGNOSIS — M25541 Pain in joints of right hand: Secondary | ICD-10-CM

## 2022-03-10 DIAGNOSIS — Z79899 Other long term (current) drug therapy: Secondary | ICD-10-CM

## 2022-03-10 LAB — LIPID PANEL
Cholesterol: 191 mg/dL (ref 0–200)
HDL: 51.9 mg/dL (ref >39.00)
LDL Cholesterol: 114 mg/dL — ABNORMAL HIGH (ref 0–99)
NonHDL: 138.62
Total CHOL/HDL Ratio: 4
Triglycerides: 121 mg/dL (ref 0.0–149.0)
VLDL: 24.2 mg/dL (ref 0.0–40.0)

## 2022-03-10 LAB — COMPREHENSIVE METABOLIC PANEL
ALT: 16 U/L (ref 0–35)
AST: 19 U/L (ref 0–37)
Albumin: 4.5 g/dL (ref 3.5–5.2)
Alkaline Phosphatase: 125 U/L — ABNORMAL HIGH (ref 39–117)
BUN: 24 mg/dL — ABNORMAL HIGH (ref 6–23)
CO2: 31 mEq/L (ref 19–32)
Calcium: 9.7 mg/dL (ref 8.4–10.5)
Chloride: 104 mEq/L (ref 96–112)
Creatinine, Ser: 0.91 mg/dL (ref 0.40–1.20)
GFR: 66.77 mL/min (ref >60.00)
Glucose, Bld: 97 mg/dL (ref 70–99)
Potassium: 4.6 mEq/L (ref 3.5–5.1)
Sodium: 140 mEq/L (ref 135–145)
Total Bilirubin: 0.5 mg/dL (ref 0.2–1.2)
Total Protein: 7.1 g/dL (ref 6.0–8.3)

## 2022-03-10 LAB — VITAMIN D 25 HYDROXY (VIT D DEFICIENCY, FRACTURES): VITD: 40.7 ng/mL (ref 30.00–100.00)

## 2022-03-10 LAB — CBC WITH DIFFERENTIAL/PLATELET
Basophils Absolute: 0.1 10*3/uL (ref 0.0–0.1)
Basophils Relative: 1.2 % (ref 0.0–3.0)
Eosinophils Absolute: 0.2 10*3/uL (ref 0.0–0.7)
Eosinophils Relative: 3 % (ref 0.0–5.0)
HCT: 40.7 % (ref 36.0–46.0)
Hemoglobin: 13.6 g/dL (ref 12.0–15.0)
Lymphocytes Relative: 38.8 % (ref 12.0–46.0)
Lymphs Abs: 2.4 10*3/uL (ref 0.7–4.0)
MCHC: 33.3 g/dL (ref 30.0–36.0)
MCV: 88 fl (ref 78.0–100.0)
Monocytes Absolute: 0.6 10*3/uL (ref 0.1–1.0)
Monocytes Relative: 9.6 % (ref 3.0–12.0)
Neutro Abs: 2.9 10*3/uL (ref 1.4–7.7)
Neutrophils Relative %: 47.4 % (ref 43.0–77.0)
Platelets: 234 10*3/uL (ref 150.0–400.0)
RBC: 4.62 Mil/uL (ref 3.87–5.11)
RDW: 13.2 % (ref 11.5–15.5)
WBC: 6.2 10*3/uL (ref 4.0–10.5)

## 2022-03-10 LAB — B12 AND FOLATE PANEL
Folate: 23.9 ng/mL (ref >5.9)
Vitamin B-12: 500 pg/mL (ref 211–911)

## 2022-03-10 LAB — C-REACTIVE PROTEIN: CRP: <1 mg/dL (ref 0.5–20.0)

## 2022-03-11 LAB — RHEUMATOID FACTOR: Rheumatoid fact SerPl-aCnc: <14 IU/mL (ref <14)

## 2022-03-11 LAB — ANA: Anti Nuclear Antibody (ANA): NEGATIVE

## 2022-03-14 NOTE — Progress Notes (Signed)
Mildly elevated liver function (alkaline phos/ not overly concerning. Any recent ibuprofen/tylenol/herbal supplements and or alcohol? Any right upper quadrant abdominal pain?  Cholesterol mildly elevated. Ldl 114. Goal <100 will continue to monitor.  Ana and rf negative Other labs unremarkable

## 2022-03-24 DIAGNOSIS — E782 Mixed hyperlipidemia: Secondary | ICD-10-CM | POA: Diagnosis not present

## 2022-03-24 LAB — LIPID PANEL
Chol/HDL Ratio: 4.4 ratio (ref 0.0–4.4)
Cholesterol, Total: 214 mg/dL — ABNORMAL HIGH (ref 100–199)
HDL: 49 mg/dL (ref >39)
LDL Chol Calc (NIH): 141 mg/dL — ABNORMAL HIGH (ref 0–99)
Triglycerides: 132 mg/dL (ref 0–149)
VLDL Cholesterol Cal: 24 mg/dL (ref 5–40)

## 2022-03-28 ENCOUNTER — Encounter: Payer: Self-pay | Admitting: Cardiovascular Disease

## 2022-05-10 ENCOUNTER — Ambulatory Visit: Payer: BC Managed Care – PPO | Admitting: Dermatology

## 2022-05-27 ENCOUNTER — Encounter: Payer: Self-pay | Admitting: Hematology and Oncology

## 2022-05-29 ENCOUNTER — Other Ambulatory Visit: Payer: Self-pay | Admitting: *Deleted

## 2022-05-29 DIAGNOSIS — E669 Obesity, unspecified: Secondary | ICD-10-CM

## 2022-05-29 NOTE — Progress Notes (Signed)
Per MD request, RN placed referral to healthy weight and wellness.

## 2022-06-05 ENCOUNTER — Ambulatory Visit: Payer: BC Managed Care – PPO | Attending: General Surgery

## 2022-06-05 VITALS — Wt 178.2 lb

## 2022-06-05 DIAGNOSIS — Z483 Aftercare following surgery for neoplasm: Secondary | ICD-10-CM | POA: Insufficient documentation

## 2022-06-05 NOTE — Therapy (Addendum)
OUTPATIENT PHYSICAL THERAPY SOZO SCREENING NOTE   Patient Name: Lisa Gallegos MRN: [id number] DOB:April 01, 1956, 66 y.o., female Today's Date: 06/05/2022  PCP: Eugenia Pancoast, FNP REFERRING PROVIDER: Rolm Bookbinder, MD   PT End of Session - 06/05/22 0815     Visit Number 4   # unchanged due to screen only   PT Start Time 0811    PT Stop Time 0816    PT Time Calculation (min) 5 min    Activity Tolerance Patient tolerated treatment well    Behavior During Therapy [facility] for tasks assessed/performed             Past Medical History:  Diagnosis Date   Anxiety    Cancer ([place]) 11/2020   left breast IDC   Family history of pancreatic cancer 12/16/2020   Malignant neoplasm of upper-outer quadrant of left breast in female, estrogen receptor positive ([place]) 12/03/2020   Personal history of radiation therapy    Past Surgical History:  Procedure Laterality Date   BREAST LUMPECTOMY WITH RADIOACTIVE SEED AND AXILLARY LYMPH NODE DISSECTION Left 12/23/2020   Procedure: LEFT BREAST LUMPECTOMY WITH RADIOACTIVE SEED AND LEFT AXILLARY LYMPH NODE BIOPSY;  Surgeon: Rolm Bookbinder, MD;  Location: [place];  Service: General;  Laterality: Left;  PEC BLOCK; START TIME OF 11:30 AM FOR 60 MINUTES ROOM 2 [place] IQ   [place]     laser surgery   LAPAROSCOPIC APPENDECTOMY N/A 05/10/2018   Procedure: APPENDECTOMY LAPAROSCOPIC;  Surgeon: Excell Seltzer, MD;  Location: [place];  Service: General;  Laterality: N/A;   Patient Active Problem List   Diagnosis Date Noted   Arthralgia of both hands 03/07/2022   History of breast cancer 03/06/2022   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 Patient underwent a left lumpectomy and sentinel node biopsy (3 negative nodes) on 12/23/2020. It is ER/PR positive and HER2 negative with a Ki67 of 5%.   PRECAUTIONS: left UE Lymphedema risk, None  SUBJECTIVE: Pt returns for her 3 month L-Dex screen.   PAIN:  Are you having pain? No  SOZO SCREENING: Patient was assessed today using the SOZO machine to determine the lymphedema index score. This was compared to her baseline score. It was determined that she is within the recommended range when compared to her baseline and no further action is needed at this time. She will continue SOZO screenings. These are done every 3 months for 2 years post operatively followed by every 6 months for 2 years, and then annually.   L-DEX FLOWSHEETS - 06/05/22 0800       L-DEX LYMPHEDEMA SCREENING   Measurement Type Unilateral    L-DEX MEASUREMENT EXTREMITY Upper Extremity    POSITION  Standing    DOMINANT SIDE Right    At Risk Side Left    BASELINE SCORE (UNILATERAL) 2.2    L-DEX SCORE (UNILATERAL) 3.6    VALUE CHANGE (UNILAT) 1.4  Otelia Limes, PTA 06/05/2022, 8:19 AM

## 2022-09-25 ENCOUNTER — Ambulatory Visit: Payer: BC Managed Care – PPO

## 2022-10-18 ENCOUNTER — Other Ambulatory Visit: Payer: Self-pay | Admitting: Adult Health

## 2022-10-18 DIAGNOSIS — Z9889 Other specified postprocedural states: Secondary | ICD-10-CM

## 2022-10-23 ENCOUNTER — Ambulatory Visit: Payer: BC Managed Care – PPO | Attending: General Surgery

## 2022-10-23 VITALS — Wt 183.4 lb

## 2022-10-23 DIAGNOSIS — Z483 Aftercare following surgery for neoplasm: Secondary | ICD-10-CM | POA: Insufficient documentation

## 2022-10-23 NOTE — Therapy (Signed)
OUTPATIENT PHYSICAL THERAPY SOZO SCREENING NOTE   Patient Name: Lisa Gallegos MRN: [id number] DOB:04-10-1956, 67 y.o., female Today's Date: 10/23/2022  PCP: Eugenia Pancoast, FNP REFERRING PROVIDER: Rolm Bookbinder, MD   PT End of Session - 10/23/22 [id number]     Visit Number 4   # unchanged due to screen only   PT Start Time 3244    PT Stop Time 0901    PT Time Calculation (min) 4 min    Activity Tolerance Patient tolerated treatment well    Behavior During Therapy [facility] for tasks assessed/performed             Past Medical History:  Diagnosis Date   Anxiety    Cancer ([place]) 11/2020   left breast IDC   Family history of pancreatic cancer 12/16/2020   Malignant neoplasm of upper-outer quadrant of left breast in female, estrogen receptor positive ([place]) 12/03/2020   Personal history of radiation therapy    Past Surgical History:  Procedure Laterality Date   BREAST LUMPECTOMY WITH RADIOACTIVE SEED AND AXILLARY LYMPH NODE DISSECTION Left 12/23/2020   Procedure: LEFT BREAST LUMPECTOMY WITH RADIOACTIVE SEED AND LEFT AXILLARY LYMPH NODE BIOPSY;  Surgeon: Rolm Bookbinder, MD;  Location: [place];  Service: General;  Laterality: Left;  PEC BLOCK; START TIME OF 11:30 AM FOR 60 MINUTES ROOM 2 [place] IQ   [place]     laser surgery   LAPAROSCOPIC APPENDECTOMY N/A 05/10/2018   Procedure: APPENDECTOMY LAPAROSCOPIC;  Surgeon: Excell Seltzer, MD;  Location: [place];  Service: General;  Laterality: N/A;   Patient Active Problem List   Diagnosis Date Noted   Arthralgia of both hands 03/07/2022   History of breast cancer 03/06/2022   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 Patient underwent a left lumpectomy and sentinel node biopsy (3 negative nodes) on 12/23/2020. It is ER/PR positive and HER2 negative with a Ki67 of 5%.   PRECAUTIONS: left UE Lymphedema risk, None  SUBJECTIVE: Pt returns for her final 3 month L-Dex screen. Will begin 6 month screens after today.   PAIN:  Are you having pain? No  SOZO SCREENING: Patient was assessed today using the SOZO machine to determine the lymphedema index score. This was compared to her baseline score. It was determined that she is within the recommended range when compared to her baseline and no further action is needed at this time. She will continue SOZO screenings. These are done every 3 months for 2 years post operatively followed by every 6 months for 2 years, and then annually.   L-DEX FLOWSHEETS - 10/23/22 0800       L-DEX LYMPHEDEMA SCREENING   Measurement Type Unilateral    L-DEX MEASUREMENT EXTREMITY Upper Extremity    POSITION  Standing    DOMINANT SIDE Right    At Risk Side Left    BASELINE SCORE (UNILATERAL) 2.2    L-DEX SCORE (UNILATERAL) 4.6  VALUE CHANGE (UNILAT) 2.4              Otelia Limes, PTA 10/23/2022, 9:02 AM

## 2022-11-21 ENCOUNTER — Ambulatory Visit: Payer: BC Managed Care – PPO | Admitting: Hematology and Oncology

## 2022-11-22 ENCOUNTER — Ambulatory Visit
Admission: RE | Admit: 2022-11-22 | Discharge: 2022-11-22 | Disposition: A | Discharge status: Home / Self Care | Payer: Medicare Other | Source: Ambulatory Visit | Attending: Adult Health | Admitting: Adult Health

## 2022-11-22 DIAGNOSIS — Z853 Personal history of malignant neoplasm of breast: Secondary | ICD-10-CM | POA: Diagnosis not present

## 2022-11-22 DIAGNOSIS — Z9889 Other specified postprocedural states: Secondary | ICD-10-CM

## 2022-11-24 NOTE — Progress Notes (Signed)
Patient Care Team: Eugenia Pancoast, FNP as PCP - General (Family Medicine) Kyung Rudd, MD as Consulting Physician (Radiation Oncology) Rolm Bookbinder, MD as Consulting Physician (General Surgery) Nicholas Lose, MD as Consulting Physician (Hematology and Oncology) Lavonna Monarch, MD (Inactive) as Consulting Physician (Dermatology) O'Neal, Cassie Freer, MD as Consulting Physician (Cardiology)  DIAGNOSIS: No diagnosis found.  SUMMARY OF ONCOLOGIC HISTORY: Oncology History  Malignant neoplasm of upper-outer quadrant of left breast in female, estrogen receptor positive ([place]) (Resolved)  12/03/2020 Initial Diagnosis   Screening mammogram showed a left breast mass. Diagnostic mammogram and US showed a 0.5cm mass at the 12 o'clock position. Biopsy showed IDC, grade 2, HER-2 equivocal by IHC, negative by FISH (ratio 1.26), ER+ >95%, PR+ 40%, Ki67 5%.   12/15/2020 Cancer Staging   Staging form: Breast, AJCC 8th Edition - Clinical stage from 12/15/2020: Stage IA (cT1a, cN0, cM0, G2, ER+, PR+, HER2-) - Signed by Nicholas Lose, MD on 12/15/2020 Stage prefix: Initial diagnosis Laterality: Left Staged by: Pathologist and managing physician Stage used in treatment planning: Yes National guidelines used in treatment planning: Yes Type of national guideline used in treatment planning: NCCN   12/23/2020 Surgery   Left lumpectomy Donne Hazel): IDC, grade 1, 0.6cm, clear margins, 3 left axillary lymph nodes negative for carcinoma.   01/24/2021 - 02/18/2021 Radiation Therapy   Adjuvant radiation  01/24/2021 through 02/18/2021 Site Technique Total Dose (Gy) Dose per Fx (Gy) Completed Fx Beam Energies  Breast, Left: Breast_Lt 3D 42.56/42.56 2.66 16/16 6X  Breast, Left: Breast_Lt_Bst 3D 8/8 2 4/4 6X    03/2021 -  Anti-estrogen oral therapy   Anastrozole daily     CHIEF COMPLIANT:  Follow-up of left breast cancer   INTERVAL HISTORY: Lisa Gallegos is a 67 y.o. with above-mentioned history of left breast  cancer who underwent a lumpectomy and radiation. Mammogram on 11/17/2021 showed no evidence of malignancy. She presents to the clinic today for follow-up.      ALLERGIES:  has No Known Allergies.  MEDICATIONS:  Current Outpatient Medications  Medication Sig Dispense Refill   ALPRAZolam (XANAX) 0.5 MG tablet Take 0.5 mg by mouth as needed.     anastrozole (ARIMIDEX) 1 MG tablet Take 1 tablet (1 mg total) by mouth daily. 90 tablet 3   atorvastatin (LIPITOR) 10 MG tablet Take 1 tablet (10 mg total) by mouth every other day. 45 tablet 3   Cholecalciferol (VITAMIN D3) 125 MCG (5000 UT) TABS Take 5,000 Units by mouth daily in the afternoon.     Coenzyme Q10 (CO Q 10 PO) Take 200 mg by mouth daily in the afternoon.     Cyanocobalamin (VITAMIN B 12 PO) Take 50 mcg by mouth daily in the afternoon.     Magnesium 400 MG TABS Take 400 mg by mouth daily in the afternoon.     Omega-3 1000 MG CAPS Take by mouth. Algae omega     RA TURMERIC EXTRA STRENGTH PO Take 500 mg by mouth daily.     No current facility-administered medications for this visit.    PHYSICAL EXAMINATION: ECOG PERFORMANCE STATUS: {CHL ONC ECOG [id number]}  There were no vitals filed for this visit. There were no vitals filed for this visit.  BREAST:*** No palpable masses or nodules in either right or left breasts. No palpable axillary supraclavicular or infraclavicular adenopathy no breast tenderness or nipple discharge. (exam performed in the presence of a chaperone)  LABORATORY DATA:  I have reviewed the data as listed    Latest Ref  Rng & Units 03/10/2022    8:21 AM 12/15/2020    8:33 AM 05/09/2018   10:19 PM  CMP  Glucose 70 - 99 mg/dL 97  98  125   BUN 6 - 23 mg/dL 24  18  20   $ Creatinine 0.40 - 1.20 mg/dL 0.91  0.88  0.96   Sodium 135 - 145 mEq/L 140  141  136   Potassium 3.5 - 5.1 mEq/L 4.6  4.5  3.7   Chloride 96 - 112 mEq/L 104  105  102   CO2 19 - 32 mEq/L 31  28  24   $ Calcium 8.4 - 10.5 mg/dL 9.7  9.5  9.2    Total Protein 6.0 - 8.3 g/dL 7.1  7.5  7.3   Total Bilirubin 0.2 - 1.2 mg/dL 0.5  0.4  0.6   Alkaline Phos 39 - 117 U/L 125  98  81   AST 0 - 37 U/L 19  26  21   $ ALT 0 - 35 U/L 16  36  17     Lab Results  Component Value Date   WBC 6.2 03/10/2022   HGB 13.6 03/10/2022   HCT 40.7 03/10/2022   MCV 88.0 03/10/2022   PLT 234.0 03/10/2022   NEUTROABS 2.9 03/10/2022    ASSESSMENT & PLAN:  No problem-specific Assessment & Plan notes found for this encounter.    No orders of the defined types were placed in this encounter.  The patient has a good understanding of the overall plan. she agrees with it. she will call with any problems that may develop before the next visit here. Total time spent: 30 mins including face to face time and time spent for planning, charting and co-ordination of care   Lisa Gallegos, [place] 11/24/22    I Gardiner Coins am acting as a Education administrator for [employer]  ***

## 2022-11-25 NOTE — Assessment & Plan Note (Addendum)
12/23/2020: Left lumpectomy Dwain Sarna): IDC, grade 1, 0.6cm, clear margins, 3 left axillary lymph nodes negative for carcinoma.HER-2 equivocal by IHC, negative by FISH (ratio 1.26), ER+ >95%, PR+ 40%, Ki67 5%   Recommendation: 1. Adjuvant radiation therapy 01/25/21- 02/18/21 2. Adjuvant antiestrogen therapy with anastrozole started 03/16/2021   Anastrozole toxicities: Joint stiffness especially in the hands in the morning.  Gets better through the day.      Breast cancer surveillance: 1.  Breast exam 11/27/2022: Benign 2. mammogram 11/22/2022: Benign breast density category B   Bone density will be ordered for June 2024. Return to clinic in October for follow-up and after that we can see her once a year.

## 2022-11-27 ENCOUNTER — Other Ambulatory Visit: Payer: Self-pay

## 2022-11-27 ENCOUNTER — Inpatient Hospital Stay: Payer: Medicare Other | Attending: Hematology and Oncology | Admitting: Hematology and Oncology

## 2022-11-27 VITALS — BP 154/79 | HR 70 | Temp 97.0°F | Resp 16 | Wt 181.0 lb

## 2022-11-27 DIAGNOSIS — Z923 Personal history of irradiation: Secondary | ICD-10-CM | POA: Diagnosis not present

## 2022-11-27 DIAGNOSIS — C50412 Malignant neoplasm of upper-outer quadrant of left female breast: Secondary | ICD-10-CM | POA: Insufficient documentation

## 2022-11-27 DIAGNOSIS — Z79811 Long term (current) use of aromatase inhibitors: Secondary | ICD-10-CM | POA: Diagnosis not present

## 2022-11-27 DIAGNOSIS — Z780 Asymptomatic menopausal state: Secondary | ICD-10-CM | POA: Diagnosis not present

## 2022-11-27 DIAGNOSIS — Z170 Estrogen receptor positive status [ER+]: Secondary | ICD-10-CM | POA: Insufficient documentation

## 2022-11-27 MED ORDER — ANASTROZOLE 1 MG PO TABS: 1.0000 mg | ORAL_TABLET | Freq: Every day | ORAL | 3 refills | Status: DC

## 2022-11-28 ENCOUNTER — Telehealth: Payer: Self-pay | Admitting: Family

## 2022-11-28 DIAGNOSIS — Z1211 Encounter for screening for malignant neoplasm of colon: Secondary | ICD-10-CM

## 2022-11-28 NOTE — Telephone Encounter (Signed)
Patient would like to be set up for her colonoscopy,would like to know how she would go about doing so?

## 2022-11-29 NOTE — Telephone Encounter (Signed)
Patient needs referral to GI for colonoscopy.

## 2022-11-30 NOTE — Addendum Note (Signed)
Addended by: Eugenia Pancoast on: 11/30/2022 09:02 AM   Modules accepted: Orders

## 2022-12-01 ENCOUNTER — Telehealth: Payer: Self-pay

## 2022-12-01 ENCOUNTER — Other Ambulatory Visit: Payer: Self-pay

## 2022-12-01 DIAGNOSIS — Z1211 Encounter for screening for malignant neoplasm of colon: Secondary | ICD-10-CM

## 2022-12-01 MED ORDER — NA SULFATE-K SULFATE-MG SULF 17.5-3.13-1.6 GM/177ML PO SOLN: 1.0000 | Freq: Once | ORAL | 0 refills | Status: AC

## 2022-12-01 NOTE — Telephone Encounter (Signed)
Gastroenterology Pre-Procedure Review  Request Date: 12/28/22 Requesting Physician: Dr. Marius Ditch  PATIENT REVIEW QUESTIONS: The patient responded to the following health history questions as indicated:    1. Are you having any GI issues? no 2. Do you have a personal history of Polyps? no 3. Do you have a family history of Colon Cancer or Polyps? no 4. Diabetes Mellitus? no 5. Joint replacements in the past 12 months?no 6. Major health problems in the past 3 months?no 7. Any artificial heart valves, MVP, or defibrillator?no    MEDICATIONS & ALLERGIES:    Patient reports the following regarding taking any anticoagulation/antiplatelet therapy:   Plavix, Coumadin, Eliquis, Xarelto, Lovenox, Pradaxa, Brilinta, or Effient? no Aspirin? no  Patient confirms/reports the following medications:  Current Outpatient Medications  Medication Sig Dispense Refill   anastrozole (ARIMIDEX) 1 MG tablet Take 1 tablet (1 mg total) by mouth daily. 90 tablet 3   Cholecalciferol (VITAMIN D3) 125 MCG (5000 UT) TABS Take 5,000 Units by mouth daily in the afternoon.     Coenzyme Q10 (CO Q 10 PO) Take 200 mg by mouth daily in the afternoon.     Cyanocobalamin (VITAMIN B 12 PO) Take 50 mcg by mouth daily in the afternoon.     Magnesium 400 MG TABS Take 400 mg by mouth daily in the afternoon.     Omega-3 1000 MG CAPS Take by mouth. Algae omega     RA TURMERIC EXTRA STRENGTH PO Take 500 mg by mouth daily.     No current facility-administered medications for this visit.    Patient confirms/reports the following allergies:  No Known Allergies  No orders of the defined types were placed in this encounter.   AUTHORIZATION INFORMATION Primary Insurance: 1D#: Group #:  Secondary Insurance: 1D#: Group #:  SCHEDULE INFORMATION: Date: 12/28/22 Time: Location: [facility]

## 2022-12-27 ENCOUNTER — Encounter: Payer: Self-pay | Admitting: Gastroenterology

## 2022-12-28 ENCOUNTER — Ambulatory Visit
Admission: RE | Admit: 2022-12-28 | Discharge: 2022-12-28 | Disposition: A | Discharge status: Home / Self Care | Payer: Medicare Other | Attending: Gastroenterology | Admitting: Gastroenterology

## 2022-12-28 ENCOUNTER — Ambulatory Visit: Payer: Medicare Other | Admitting: Anesthesiology

## 2022-12-28 ENCOUNTER — Encounter
Admission: RE | Disposition: A | Discharge status: Home / Self Care | Payer: Self-pay | Source: Home / Self Care | Attending: Gastroenterology

## 2022-12-28 ENCOUNTER — Encounter: Payer: Self-pay | Admitting: Gastroenterology

## 2022-12-28 DIAGNOSIS — D125 Benign neoplasm of sigmoid colon: Secondary | ICD-10-CM | POA: Diagnosis not present

## 2022-12-28 DIAGNOSIS — K635 Polyp of colon: Secondary | ICD-10-CM

## 2022-12-28 DIAGNOSIS — D120 Benign neoplasm of cecum: Secondary | ICD-10-CM | POA: Diagnosis not present

## 2022-12-28 DIAGNOSIS — Z1211 Encounter for screening for malignant neoplasm of colon: Secondary | ICD-10-CM

## 2022-12-28 DIAGNOSIS — D122 Benign neoplasm of ascending colon: Secondary | ICD-10-CM | POA: Diagnosis not present

## 2022-12-28 HISTORY — PX: COLONOSCOPY WITH PROPOFOL: SHX5780

## 2022-12-28 SURGERY — COLONOSCOPY WITH PROPOFOL
Anesthesia: General

## 2022-12-28 MED ORDER — PROPOFOL 500 MG/50ML IV EMUL
INTRAVENOUS | Status: DC | PRN
  Administered 2022-12-28: 165 ug/kg/min via INTRAVENOUS

## 2022-12-28 MED ORDER — SODIUM CHLORIDE 0.9 % IV SOLN: INTRAVENOUS | Status: DC

## 2022-12-28 MED ORDER — LIDOCAINE HCL (CARDIAC) PF 100 MG/5ML IV SOSY
PREFILLED_SYRINGE | INTRAVENOUS | Status: DC | PRN
  Administered 2022-12-28: 100 mg via INTRAVENOUS

## 2022-12-28 MED ORDER — PROPOFOL 10 MG/ML IV BOLUS
INTRAVENOUS | Status: DC | PRN
  Administered 2022-12-28: 20 mg via INTRAVENOUS
  Administered 2022-12-28: 10 mg via INTRAVENOUS
  Administered 2022-12-28: 60 mg via INTRAVENOUS

## 2022-12-28 NOTE — Transfer of Care (Signed)
Immediate Anesthesia Transfer of Care Note  Patient: Lisa Gallegos  Procedure(s) Performed: COLONOSCOPY WITH PROPOFOL  Patient Location: PACU and Endoscopy Unit  Anesthesia Type:General  Level of Consciousness: drowsy and patient cooperative  Airway & Oxygen Therapy: Patient Spontanous Breathing  Post-op Assessment: Report given to RN and Post -op Vital signs reviewed and stable  Post vital signs: Reviewed and stable  Last Vitals:  Vitals Value Taken Time  BP 118/61 12/28/22 0906  Temp 36.1 C 12/28/22 0903  Pulse 64 12/28/22 0911  Resp 14 12/28/22 0911  SpO2 100 % 12/28/22 0911  Vitals shown include unvalidated device data.  Last Pain:  Vitals:   12/28/22 0903  TempSrc: Temporal  PainSc: Asleep         Complications: No notable events documented.

## 2022-12-28 NOTE — Anesthesia Postprocedure Evaluation (Signed)
Anesthesia Post Note  Patient: Lisa Gallegos  Procedure(s) Performed: COLONOSCOPY WITH PROPOFOL  Patient location during evaluation: PACU Anesthesia Type: General Level of consciousness: awake and alert Pain management: pain level controlled Vital Signs Assessment: post-procedure vital signs reviewed and stable Respiratory status: spontaneous breathing, nonlabored ventilation and respiratory function stable Cardiovascular status: blood pressure returned to baseline and stable Postop Assessment: no apparent nausea or vomiting Anesthetic complications: no   No notable events documented.   Last Vitals:  Vitals:   12/28/22 0913 12/28/22 0923  BP: 105/69 120/71  Pulse:  64  Resp:    Temp:    SpO2:  100%    Last Pain:  Vitals:   12/28/22 0923  TempSrc:   PainSc: 0-No pain                 [place] Ouch

## 2022-12-28 NOTE — H&P (Signed)
Cephas Darby, MD [address]  [place]  [place],  [address]  Main: [phone]  Fax: [phone] Pager: [phone]  Primary Care Physician:  Eugenia Pancoast, FNP Primary Gastroenterologist:  Dr. Cephas Darby  Pre-Procedure History & Physical: HPI:  Lisa Gallegos is a 67 y.o. female is here for an colonoscopy.   Past Medical History:  Diagnosis Date   Anxiety    Cancer ([place]) 11/2020   left breast IDC   Family history of pancreatic cancer 12/16/2020   Malignant neoplasm of upper-outer quadrant of left breast in female, estrogen receptor positive ([place]) 12/03/2020   Personal history of radiation therapy     Past Surgical History:  Procedure Laterality Date   APPENDECTOMY     BREAST LUMPECTOMY WITH RADIOACTIVE SEED AND AXILLARY LYMPH NODE DISSECTION Left 12/23/2020   Procedure: LEFT BREAST LUMPECTOMY WITH RADIOACTIVE SEED AND LEFT AXILLARY LYMPH NODE BIOPSY;  Surgeon: Rolm Bookbinder, MD;  Location: [place];  Service: General;  Laterality: Left;  PEC BLOCK; START TIME OF 11:30 AM FOR 60 MINUTES ROOM 2 [place] IQ   [place]   FTP   KIDNEY STONE SURGERY     laser surgery   LAPAROSCOPIC APPENDECTOMY N/A 05/10/2018   Procedure: APPENDECTOMY LAPAROSCOPIC;  Surgeon: Excell Seltzer, MD;  Location: [place];  Service: General;  Laterality: N/A;    Prior to Admission medications   Medication Sig Start Date End Date Taking? Authorizing Provider  anastrozole (ARIMIDEX) 1 MG tablet Take 1 tablet (1 mg total) by mouth daily. 11/27/22  Yes Nicholas Lose, MD  Cholecalciferol (VITAMIN D3) 125 MCG (5000 UT) TABS Take 5,000 Units by mouth daily in the afternoon.    [provider]  Coenzyme Q10 (CO Q 10 PO) Take 200 mg by mouth daily in the afternoon.    [provider]  Cyanocobalamin (VITAMIN B 12 PO) Take 50 mcg by mouth daily in the afternoon.    [provider]  Magnesium 400 MG TABS Take  400 mg by mouth daily in the afternoon.    [provider]  Omega-3 1000 MG CAPS Take by mouth. Algae omega    [provider]  RA TURMERIC EXTRA STRENGTH PO Take 500 mg by mouth daily.    [provider]    Allergies as of 12/01/2022   (No Known Allergies)    Family History  Problem Relation Age of Onset   Pancreatic cancer Mother 18   Heart disease Father    Healthy Sister    Healthy Brother    Other Son        meningioma   Lung cancer Maternal Uncle        dx after 78, smoking hx   Breast cancer Neg Hx     Social History   Socioeconomic History   Marital status: Divorced    Spouse name: Not on file   Number of children: 2   Years of education: Not on file   Highest education level: Not on file  Occupational History   Occupation: retired  Tobacco Use   Smoking status: Never   Smokeless tobacco: Never  Vaping Use   Vaping Use: Never used  Substance and Sexual Activity   Alcohol use: Not Currently    Alcohol/week: 0.0 standard drinks of alcohol   Drug use: No   Sexual activity: Yes    Partners: Male    Birth control/protection: Post-menopausal  Other Topics  Concern   Not on file  Social History Narrative   Lives in [place] with fiance. 2 dogs       Work - Personal assistant.      Diet - chicken and fish with veggies, avoids red meat. Vegetarian to bring cholesterol down as doesn't want to be treated with cholesterol medications if she can avoid it.       Social Determinants of Health   Financial Resource Strain: Low Risk  (12/15/2020)   Overall Financial Resource Strain (CARDIA)    Difficulty of Paying Living Expenses: Not hard at all  Food Insecurity: No Food Insecurity (12/15/2020)   Hunger Vital Sign    Worried About Running Out of Food in the Last Year: Never true    Ran Out of Food in the Last Year: Never true  Transportation Needs: No Transportation Needs (12/15/2020)   PRAPARE - Radiographer, therapeutic (Medical): No    Lack of Transportation (Non-Medical): No  Physical Activity: Not on file  Stress: Not on file  Social Connections: Not on file  Intimate Partner Violence: Not on file    Review of Systems: See HPI, otherwise negative ROS  Physical Exam: BP 127/82   Pulse 80   Temp (!) 97.1 F (36.2 C) (Temporal)   Resp 16   Ht '5\' 5"'$  (1.651 m)   Wt 81.2 kg   LMP 02/17/2012   SpO2 99%   BMI 29.79 kg/m  General:   Alert,  pleasant and cooperative in NAD Head:  Normocephalic and atraumatic. Neck:  Supple; no masses or thyromegaly. Lungs:  Clear throughout to auscultation.    Heart:  Regular rate and rhythm. Abdomen:  Soft, nontender and nondistended. Normal bowel sounds, without guarding, and without rebound.   Neurologic:  Alert and  oriented x4;  grossly normal neurologically.  Impression/Plan: Tamsyn Trissel is here for an colonoscopy to be performed for colon cancer screening  Risks, benefits, limitations, and alternatives regarding  colonoscopy have been reviewed with the patient.  Questions have been answered.  All parties agreeable.   Sherri Sear, MD  12/28/2022, 8:16 AM

## 2022-12-28 NOTE — Op Note (Signed)
[facility] Gastroenterology Patient Name: Lisa Gallegos Procedure Date: 12/28/2022 8:17 AM MRN: [id number] Account #: [id number] Date of Birth: [birth year] Admit Type: Outpatient Age: 67 Room: [facility] ENDO ROOM 3 Gender: Female Note Status: Finalized Instrument Name: Jasper Riling [id number] Procedure:             Colonoscopy Indications:           Screening for colorectal malignant neoplasm, Last                         colonoscopy 10 years ago Providers:             Lin Landsman MD, MD Referring MD:          Eugenia Pancoast (Referring MD) Medicines:             General Anesthesia Complications:         No immediate complications. Estimated blood loss: None. Procedure:             Pre-Anesthesia Assessment:                        - Prior to the procedure, a History and Physical was                         performed, and patient medications and allergies were                         reviewed. The patient is competent. The risks and                         benefits of the procedure and the sedation options and                         risks were discussed with the patient. All questions                         were answered and informed consent was obtained.                         Patient identification and proposed procedure were                         verified by the physician, the nurse, the                         anesthesiologist, the anesthetist and the technician                         in the pre-procedure area in the procedure room in the                         endoscopy suite. Mental Status Examination: alert and                         oriented. Airway Examination: normal oropharyngeal                         airway and neck mobility. Respiratory Examination:  clear to auscultation. CV Examination: normal.                         Prophylactic Antibiotics: The patient does not require                         prophylactic antibiotics.  Prior Anticoagulants: The                         patient has taken no anticoagulant or antiplatelet                         agents. ASA Grade Assessment: II - A patient with mild                         systemic disease. After reviewing the risks and                         benefits, the patient was deemed in satisfactory                         condition to undergo the procedure. The anesthesia                         plan was to use general anesthesia. Immediately prior                         to administration of medications, the patient was                         re-assessed for adequacy to receive sedatives. The                         heart rate, respiratory rate, oxygen saturations,                         blood pressure, adequacy of pulmonary ventilation, and                         response to care were monitored throughout the                         procedure. The physical status of the patient was                         re-assessed after the procedure.                        After obtaining informed consent, the colonoscope was                         passed under direct vision. Throughout the procedure,                         the patient's blood pressure, pulse, and oxygen                         saturations were monitored continuously. The  Colonoscope was introduced through the anus and                         advanced to the the cecum, identified by appendiceal                         orifice and ileocecal valve. The colonoscopy was                         performed without difficulty. The patient tolerated                         the procedure well. The quality of the bowel                         preparation was evaluated using the BBPS [facility] Bowel                         Preparation Scale) with scores of: Right Colon = 3,                         Transverse Colon = 3 and Left Colon = 3 (entire mucosa                         seen well with no  residual staining, small fragments                         of stool or opaque liquid). The total BBPS score                         equals 9. The ileocecal valve, appendiceal orifice,                         and rectum were photographed. Findings:      The perianal and digital rectal examinations were normal. Pertinent       negatives include normal sphincter tone and no palpable rectal lesions.      A 5 mm polyp was found in the cecum. The polyp was sessile. The polyp       was removed with a cold snare. Resection and retrieval were complete.      A 20 mm polyp was found in the proximal ascending colon. The polyp was       flat. Preparations were made for mucosal resection. Demarcation of the       lesion was performed with narrow band imaging to clearly identify the       boundaries of the lesion. Eleview was injected to raise the lesion.       Snare mucosal resection was performed. Resection and retrieval were       complete. Resected tissue including tissue margins will be examined by       histology. To prevent bleeding after mucosal resection, one hemostatic       clip was successfully placed (MR safe). Clip manufacturer: [employer]. There was no bleeding during, or at the end, of the       procedure.      A 5 mm polyp was found in the sigmoid colon. The polyp was sessile. The  polyp was removed with a cold snare. Resection and retrieval were       complete.      The retroflexed view of the distal rectum and anal verge was normal and       showed no anal or rectal abnormalities. Impression:            - One 5 mm polyp in the cecum, removed with a cold                         snare. Resected and retrieved.                        - One 20 mm polyp in the proximal ascending colon,                         removed with mucosal resection. Resected and                         retrieved. Clip manufacturer: [employer]. Clip                         (MR safe) was  placed.                        - One 5 mm polyp in the sigmoid colon, removed with a                         cold snare. Resected and retrieved.                        - The distal rectum and anal verge are normal on                         retroflexion view.                        - Mucosal resection was performed. Resection and                         retrieval were complete. Recommendation:        - Discharge patient to home (with escort).                        - Resume previous diet today.                        - Continue present medications.                        - Await pathology results.                        - Repeat colonoscopy in 3 years for surveillance of                         multiple polyps. Procedure Code(s):     --- Professional ---                        [phone], Colonoscopy, flexible; with endoscopic mucosal  resection                        [id number], 64, Colonoscopy, flexible; with removal of                         tumor(s), polyp(s), or other lesion(s) by snare                         technique Diagnosis Code(s):     --- Professional ---                        Z12.11, Encounter for screening for malignant neoplasm                         of colon                        D12.0, Benign neoplasm of cecum                        D12.2, Benign neoplasm of ascending colon                        D12.5, Benign neoplasm of sigmoid colon CPT copyright 2022 American Medical Association. All rights reserved. The codes documented in this report are preliminary and upon coder review may  be revised to meet current compliance requirements. Dr. Ulyess Mort Lin Landsman MD, MD 12/28/2022 9:03:43 AM This report has been signed electronically. Number of Addenda: 0 Note Initiated On: 12/28/2022 8:17 AM Scope Withdrawal Time: 0 hours 19 minutes 4 seconds  Total Procedure Duration: 0 hours 24 minutes 3 seconds  Estimated Blood Loss:  Estimated blood loss:  none.      [facility]

## 2022-12-28 NOTE — Anesthesia Preprocedure Evaluation (Signed)
Anesthesia Evaluation  Patient identified by MRN, date of birth, ID band Patient awake    Reviewed: Allergy & Precautions, H&P , NPO status , Patient's Chart, lab work & pertinent test results  Airway Mallampati: II  TM Distance: >3 FB Neck ROM: full    Dental no notable dental hx.    Pulmonary neg pulmonary ROS   Pulmonary exam normal        Cardiovascular Normal cardiovascular exam  [place] coronary artery calcium score between 100 and 199   Neuro/Psych negative neurological ROS  negative psych ROS   GI/Hepatic negative GI ROS, Neg liver ROS,,,  Endo/Other  negative endocrine ROS    Renal/GU negative Renal ROS  negative genitourinary   Musculoskeletal   Abdominal   Peds  Hematology negative hematology ROS (+)   Anesthesia Other Findings Past Medical History: No date: Anxiety 11/2020: Cancer [facility])     Comment:  left breast IDC 12/16/2020: Family history of pancreatic cancer 12/03/2020: Malignant neoplasm of upper-outer quadrant of left breast  in female, estrogen receptor positive ([place]) No date: Personal history of radiation therapy  Past Surgical History: No date: APPENDECTOMY 12/23/2020: BREAST LUMPECTOMY WITH RADIOACTIVE SEED AND AXILLARY  LYMPH NODE DISSECTION; Left     Comment:  Procedure: LEFT BREAST LUMPECTOMY WITH RADIOACTIVE SEED               AND LEFT AXILLARY LYMPH NODE BIOPSY;  Surgeon: Rolm Bookbinder, MD;  Location: [place];                Service: General;  Laterality: Left;  PEC BLOCK; START               TIME OF 11:30 AM FOR 60 MINUTES ROOM 2 [place] No date: BREAST SURGERY 1985: CESAREAN SECTION     Comment:  FTP No date: KIDNEY STONE SURGERY     Comment:  laser surgery 05/10/2018: LAPAROSCOPIC APPENDECTOMY; N/A     Comment:  Procedure: APPENDECTOMY LAPAROSCOPIC;  Surgeon:               Excell Seltzer, MD;  Location: [place];  Service:                General;  Laterality: N/A;  BMI    Body Mass Index: 29.79 kg/m      Reproductive/Obstetrics negative OB ROS                             Anesthesia Physical Anesthesia Plan  ASA: 2  Anesthesia Plan: General   Post-op Pain Management:    Induction:   PONV Risk Score and Plan: Propofol infusion and TIVA  Airway Management Planned:   Additional Equipment:   Intra-op Plan:   Post-operative Plan:   Informed Consent: I have reviewed the patients History and Physical, chart, labs and discussed the procedure including the risks, benefits and alternatives for the proposed anesthesia with the patient or authorized representative who has indicated his/her understanding and acceptance.     Dental Advisory Given  Plan Discussed with: CRNA and Surgeon  Anesthesia Plan Comments:        Anesthesia Quick Evaluation

## 2022-12-28 NOTE — Anesthesia Procedure Notes (Signed)
Procedure Name: General with mask airway Date/Time: 12/28/2022 8:37 AM  Performed by: Kelton Pillar, CRNAPre-anesthesia Checklist: Patient identified, Emergency Drugs available, Suction available and Patient being monitored Patient Re-evaluated:Patient Re-evaluated prior to induction Oxygen Delivery Method: Simple face mask Induction Type: IV induction Placement Confirmation: positive ETCO2, CO2 detector and breath sounds checked- equal and bilateral Dental Injury: Teeth and Oropharynx as per pre-operative assessment

## 2022-12-29 ENCOUNTER — Encounter: Payer: Self-pay | Admitting: Gastroenterology

## 2022-12-29 LAB — SURGICAL PATHOLOGY

## 2023-02-26 ENCOUNTER — Encounter: Payer: Self-pay | Admitting: Family

## 2023-02-26 ENCOUNTER — Ambulatory Visit (INDEPENDENT_AMBULATORY_CARE_PROVIDER_SITE_OTHER): Payer: Medicare Other | Admitting: Family

## 2023-02-26 VITALS — BP 134/84 | HR 65 | Temp 98.0°F | Ht 65.0 in | Wt 182.8 lb

## 2023-02-26 DIAGNOSIS — W57XXXA Bitten or stung by nonvenomous insect and other nonvenomous arthropods, initial encounter: Secondary | ICD-10-CM | POA: Insufficient documentation

## 2023-02-26 DIAGNOSIS — S70362A Insect bite (nonvenomous), left thigh, initial encounter: Secondary | ICD-10-CM

## 2023-02-26 MED ORDER — DOXYCYCLINE HYCLATE 100 MG PO TABS: ORAL_TABLET | ORAL | 0 refills | Status: DC

## 2023-02-26 NOTE — Progress Notes (Signed)
Established Patient Office Visit  Subjective:   Patient ID: Lisa Gallegos, female    DOB: Jul 16, 1956  Age: 67 y.o. MRN: [id number]  CC:  Chief Complaint  Patient presents with   Insect Bite    Tick Bite on back of left thigh    HPI: Lisa Gallegos is a 67 y.o. female presenting on 02/26/2023 for Insect Bite (Tick Bite on back of left thigh)   HPI  Was outside playing Saturday night in the evening and was playing ball with her grandson. She states she noticed a tick while in the tub on her left upper thigh, and she was able to pick the tick out but does state it was engorged.      ROS: Negative unless specifically indicated above in HPI.   Relevant past medical history reviewed and updated as indicated.   Allergies and medications reviewed and updated.   Current Outpatient Medications:    anastrozole (ARIMIDEX) 1 MG tablet, Take 1 tablet (1 mg total) by mouth daily., Disp: 90 tablet, Rfl: 3   Cholecalciferol (VITAMIN D3) 125 MCG (5000 UT) TABS, Take 5,000 Units by mouth daily in the afternoon., Disp: , Rfl:    Coenzyme Q10 (CO Q 10 PO), Take 200 mg by mouth daily in the afternoon., Disp: , Rfl:    Cyanocobalamin (VITAMIN B 12 PO), Take 50 mcg by mouth daily in the afternoon., Disp: , Rfl:    doxycycline (VIBRA-TABS) 100 MG tablet, Take two tablets once daily for one dose, Disp: 2 tablet, Rfl: 0   Magnesium 400 MG TABS, Take 400 mg by mouth daily in the afternoon., Disp: , Rfl:    Omega-3 1000 MG CAPS, Take by mouth. Algae omega, Disp: , Rfl:    RA TURMERIC EXTRA STRENGTH PO, Take 500 mg by mouth daily., Disp: , Rfl:   No Known Allergies  Objective:   BP 134/84 (BP Location: Right Arm)   Pulse 65   Temp 98 F (36.7 C) (Temporal)   Ht 5\' 5"  (1.651 m)   Wt 182 lb 12.8 oz (82.9 kg)   LMP 02/17/2012   SpO2 98%   BMI 30.42 kg/m    Physical Exam Constitutional:      General: She is not in acute distress.    Appearance: Normal appearance. She is normal weight. She is not  ill-appearing, toxic-appearing or diaphoretic.  HENT:     Head: Normocephalic.  Cardiovascular:     Rate and Rhythm: Normal rate.  Pulmonary:     Effort: Pulmonary effort is normal.  Musculoskeletal:        General: Normal range of motion.  Skin:    Comments: Small pinpoint insect puncture site with no erythema appears to have all tick particles out of wound no discharge  Neurological:     General: No focal deficit present.     Mental Status: She is alert and oriented to person, place, and time. Mental status is at baseline.  Psychiatric:        Mood and Affect: Mood normal.        Behavior: Behavior normal.        Thought Content: Thought content normal.        Judgment: Judgment normal.     Assessment & Plan:  Tick bite of left thigh, initial encounter Assessment & Plan: Appears to have been within 72 hours, rx doxycycline 200 mg po qd for one dose. Pt advised to:   Please monitor site for worsening signs/symptoms of infection  to include: increasing redness, increasing tenderness, increase in size, and or pustulant drainage from site. If this is to occur please let me know immediately.    Orders: -     Doxycycline Hyclate; Take two tablets once daily for one dose  Dispense: 2 tablet; Refill: 0     Follow up plan: Return for as scheduled for CPE in june.  Mort Sawyers, FNP

## 2023-02-26 NOTE — Assessment & Plan Note (Signed)
Appears to have been within 72 hours, rx doxycycline 200 mg po qd for one dose. Pt advised to:   Please monitor site for worsening signs/symptoms of infection to include: increasing redness, increasing tenderness, increase in size, and or pustulant drainage from site. If this is to occur please let me know immediately.

## 2023-03-19 ENCOUNTER — Ambulatory Visit (INDEPENDENT_AMBULATORY_CARE_PROVIDER_SITE_OTHER): Payer: Medicare Other | Admitting: Family

## 2023-03-19 ENCOUNTER — Encounter: Payer: Self-pay | Admitting: Family

## 2023-03-19 VITALS — BP 130/82 | HR 78 | Temp 97.8°F | Ht 65.0 in | Wt 183.8 lb

## 2023-03-19 DIAGNOSIS — R7989 Other specified abnormal findings of blood chemistry: Secondary | ICD-10-CM

## 2023-03-19 DIAGNOSIS — R739 Hyperglycemia, unspecified: Secondary | ICD-10-CM | POA: Diagnosis not present

## 2023-03-19 DIAGNOSIS — E538 Deficiency of other specified B group vitamins: Secondary | ICD-10-CM

## 2023-03-19 DIAGNOSIS — Z860100 Personal history of colon polyps, unspecified: Secondary | ICD-10-CM | POA: Insufficient documentation

## 2023-03-19 DIAGNOSIS — E559 Vitamin D deficiency, unspecified: Secondary | ICD-10-CM

## 2023-03-19 DIAGNOSIS — J029 Acute pharyngitis, unspecified: Secondary | ICD-10-CM | POA: Diagnosis not present

## 2023-03-19 DIAGNOSIS — R635 Abnormal weight gain: Secondary | ICD-10-CM

## 2023-03-19 DIAGNOSIS — E669 Obesity, unspecified: Secondary | ICD-10-CM

## 2023-03-19 DIAGNOSIS — Z9189 Other specified personal risk factors, not elsewhere classified: Secondary | ICD-10-CM

## 2023-03-19 DIAGNOSIS — R051 Acute cough: Secondary | ICD-10-CM

## 2023-03-19 DIAGNOSIS — Z0001 Encounter for general adult medical examination with abnormal findings: Secondary | ICD-10-CM | POA: Diagnosis not present

## 2023-03-19 DIAGNOSIS — E7800 Pure hypercholesterolemia, unspecified: Secondary | ICD-10-CM | POA: Insufficient documentation

## 2023-03-19 DIAGNOSIS — J020 Streptococcal pharyngitis: Secondary | ICD-10-CM | POA: Diagnosis not present

## 2023-03-19 DIAGNOSIS — Z2821 Immunization not carried out because of patient refusal: Secondary | ICD-10-CM

## 2023-03-19 DIAGNOSIS — Z8249 Family history of ischemic heart disease and other diseases of the circulatory system: Secondary | ICD-10-CM

## 2023-03-19 LAB — COMPREHENSIVE METABOLIC PANEL
ALT: 23 U/L (ref 0–35)
AST: 21 U/L (ref 0–37)
Albumin: 4.4 g/dL (ref 3.5–5.2)
Alkaline Phosphatase: 119 U/L — ABNORMAL HIGH (ref 39–117)
BUN: 17 mg/dL (ref 6–23)
CO2: 30 mEq/L (ref 19–32)
Calcium: 9.6 mg/dL (ref 8.4–10.5)
Chloride: 102 mEq/L (ref 96–112)
Creatinine, Ser: 0.88 mg/dL (ref 0.40–1.20)
GFR: 69.01 mL/min (ref >60.00)
Glucose, Bld: 94 mg/dL (ref 70–99)
Potassium: 4.6 mEq/L (ref 3.5–5.1)
Sodium: 141 mEq/L (ref 135–145)
Total Bilirubin: 0.4 mg/dL (ref 0.2–1.2)
Total Protein: 7.5 g/dL (ref 6.0–8.3)

## 2023-03-19 LAB — VITAMIN B12: Vitamin B-12: 526 pg/mL (ref 211–911)

## 2023-03-19 LAB — LIPID PANEL
Cholesterol: 270 mg/dL — ABNORMAL HIGH (ref 0–200)
HDL: 47.2 mg/dL (ref >39.00)
LDL Cholesterol: 188 mg/dL — ABNORMAL HIGH (ref 0–99)
NonHDL: 223.21
Total CHOL/HDL Ratio: 6
Triglycerides: 175 mg/dL — ABNORMAL HIGH (ref 0.0–149.0)
VLDL: 35 mg/dL (ref 0.0–40.0)

## 2023-03-19 LAB — TSH: TSH: 1.95 u[IU]/mL (ref 0.35–5.50)

## 2023-03-19 LAB — VITAMIN D 25 HYDROXY (VIT D DEFICIENCY, FRACTURES): VITD: 42.22 ng/mL (ref 30.00–100.00)

## 2023-03-19 LAB — T4, FREE: Free T4: 0.98 ng/dL (ref 0.60–1.60)

## 2023-03-19 LAB — HEMOGLOBIN A1C: Hgb A1c MFr Bld: 5.6 % (ref 4.6–6.5)

## 2023-03-19 LAB — POCT RAPID STREP A (OFFICE): Rapid Strep A Screen: POSITIVE — AB

## 2023-03-19 MED ORDER — AMOXICILLIN 500 MG PO CAPS: 500.0000 mg | ORAL_CAPSULE | Freq: Two times a day (BID) | ORAL | 0 refills | Status: AC

## 2023-03-19 MED ORDER — BENZONATATE 200 MG PO CAPS: 200.0000 mg | ORAL_CAPSULE | Freq: Three times a day (TID) | ORAL | 0 refills | Status: DC | PRN

## 2023-03-19 NOTE — Assessment & Plan Note (Signed)
Patient Counseling(The following topics were reviewed): ? Preventative care handout given to pt  ?Health maintenance and immunizations reviewed. Please refer to Health maintenance section. ?Pt advised on safe sex, wearing seatbelts in car, and proper nutrition ?labwork ordered today for annual ?Dental health: Discussed importance of regular tooth brushing, flossing, and dental visits. ? ? ?

## 2023-03-19 NOTE — Patient Instructions (Addendum)
 ------------------------------------    You were found to be strep positive,  Take antibiotics that have been sent to the pharmacy.  Change your toothbrush after 24 hours on the antibiotics.  Gargle with warm salt water as needed for sore throat.   ------------------------------------  A referral was placed today for medical weight loss clinic  Please let us know if you have not heard back within 2 weeks about the referral.   Regards,   Nevae Pinnix FNP-C

## 2023-03-19 NOTE — Assessment & Plan Note (Signed)
Ordered lipid panel, pending results. Work on low cholesterol diet and exercise as tolerated  

## 2023-03-19 NOTE — Progress Notes (Signed)
Subjective:  Patient ID: Lisa Gallegos, female    DOB: 1955/12/07  Age: 67 y.o. MRN: [id number]  Patient Care Team: Mort Sawyers, FNP as PCP - General (Family Medicine) Dorothy Puffer, MD as Consulting Physician (Radiation Oncology) Emelia Loron, MD as Consulting Physician (General Surgery) Serena Croissant, MD as Consulting Physician (Hematology and Oncology) Janalyn Harder, MD (Inactive) as Consulting Physician (Dermatology) O'Neal, Ronnald Ramp, MD as Consulting Physician (Cardiology)   CC:  Chief Complaint  Patient presents with   Annual Exam   Sore Throat    HPI Lisa Gallegos is a 67 y.o. female who presents today for an annual physical exam. She reports consuming a general diet.  Goes to [employer] and yoga at home  She generally feels well. She reports sleeping well. She does not have additional problems to discuss today.   Vision:Within last year Dental:Receives regular dental care STD:The patient denies history of sexually transmitted disease.  Does not have advanced directive or living will  Mammogram: 11/22/22, annual diagnostic with oncologist Last pap: 11/2022 negative per pt. Gyn Dr. Tildon Gallegos  Colonoscopy: 12/28/22 Bone density scan:scheduled for 6 /11/24 Declines pneumonia and shingles vaccination   Pt is with acute concerns.  Kept her grand daughter last week Thursday who was sneezing with runny nose and states that on Saturday she started with nasal congestion, scratchy throat, and non productive cough. No fever no chills.   Vitamin d def:  Taking 5000 IU once daily has been low in the past.  Wt Readings from Last 3 Encounters:  03/19/23 183 lb 12.8 oz (83.4 kg)  02/26/23 182 lb 12.8 oz (82.9 kg)  12/28/22 179 lb (81.2 kg)   Temp Readings from Last 3 Encounters:  03/19/23 97.8 F (36.6 C) (Temporal)  02/26/23 98 F (36.7 C) (Temporal)  12/28/22 (!) 97 F (36.1 C) (Temporal)   BP Readings from Last 3 Encounters:  03/19/23 130/82  02/26/23 134/84   12/28/22 120/71   Pulse Readings from Last 3 Encounters:  03/19/23 78  02/26/23 65  12/28/22 64    Advanced Directives Patient does not have advanced directives  DEPRESSION SCREENING    03/19/2023   11:12 AM 02/26/2023   10:50 AM 04/02/2019    8:48 AM 12/27/2016    9:53 AM 06/12/2016    7:24 AM  PHQ 2/9 Scores  PHQ - 2 Score 0 0 0 0 0  PHQ- 9 Score  0 0       ROS: Negative unless specifically indicated above in HPI.    Current Outpatient Medications:    amoxicillin (AMOXIL) 500 MG capsule, Take 1 capsule (500 mg total) by mouth 2 (two) times daily for 10 days., Disp: 20 capsule, Rfl: 0   anastrozole (ARIMIDEX) 1 MG tablet, Take 1 tablet (1 mg total) by mouth daily., Disp: 90 tablet, Rfl: 3   benzonatate (TESSALON) 200 MG capsule, Take 1 capsule (200 mg total) by mouth 3 (three) times daily as needed for cough., Disp: 20 capsule, Rfl: 0   Cholecalciferol (VITAMIN D3) 125 MCG (5000 UT) TABS, Take 5,000 Units by mouth daily in the afternoon., Disp: , Rfl:    Coenzyme Q10 (CO Q 10 PO), Take 200 mg by mouth daily in the afternoon., Disp: , Rfl:    Cyanocobalamin (VITAMIN B 12 PO), Take 50 mcg by mouth daily in the afternoon., Disp: , Rfl:    Magnesium 400 MG TABS, Take 400 mg by mouth daily in the afternoon., Disp: , Rfl:  Omega-3 1000 MG CAPS, Take by mouth. Algae omega, Disp: , Rfl:    RA TURMERIC EXTRA STRENGTH PO, Take 500 mg by mouth daily., Disp: , Rfl:     Objective:    BP 130/82 (BP Location: Right Arm)   Pulse 78   Temp 97.8 F (36.6 C) (Temporal)   Ht 5\' 5"  (1.651 m)   Wt 183 lb 12.8 oz (83.4 kg)   LMP 02/17/2012   SpO2 98%   BMI 30.59 kg/m   BP Readings from Last 3 Encounters:  03/19/23 130/82  02/26/23 134/84  12/28/22 120/71      Physical Exam Constitutional:      General: She is not in acute distress.    Appearance: Normal appearance. She is obese. She is not ill-appearing.  HENT:     Head: Normocephalic.     Right Ear: Tympanic membrane normal.      Left Ear: Tympanic membrane normal.     Nose: Nose normal.     Mouth/Throat:     Mouth: Mucous membranes are moist.  Eyes:     Extraocular Movements: Extraocular movements intact.     Pupils: Pupils are equal, round, and reactive to light.  Cardiovascular:     Rate and Rhythm: Normal rate and regular rhythm.  Pulmonary:     Effort: Pulmonary effort is normal.     Breath sounds: Normal breath sounds.  Abdominal:     General: Abdomen is flat. Bowel sounds are normal.     Palpations: Abdomen is soft.     Tenderness: There is no guarding or rebound.  Musculoskeletal:        General: Normal range of motion.     Cervical back: Normal range of motion.  Skin:    General: Skin is warm.     Capillary Refill: Capillary refill takes less than 2 seconds.  Neurological:     General: No focal deficit present.     Mental Status: She is alert.  Psychiatric:        Mood and Affect: Mood normal.        Behavior: Behavior normal.        Thought Content: Thought content normal.        Judgment: Judgment normal.          Assessment & Plan:  Sore throat -     POCT rapid strep A  Vitamin B12 deficiency -     Vitamin B12  Vitamin D deficiency -     VITAMIN D 25 Hydroxy (Vit-D Deficiency, Fractures)  History of colon polyps  Elevated LDL cholesterol level Assessment & Plan: Ordered lipid panel, pending results. Work on low cholesterol diet and exercise as tolerated   Orders: -     Lipid panel -     Lipoprotein A (LPA) -     Amb Ref to Medical Weight Management  Weight gain -     TSH -     T4, free  Elevated liver function tests -     Comprehensive metabolic panel  Strep pharyngitis Assessment & Plan: Strep tested positive in office.  rx for amox 500 mg po bid x 10 days.  Ibuprofen/tyelnol prn sore throat/fever Pt told to F/u if no improvement in the next 2-3 days.   Orders: -     Amoxicillin; Take 1 capsule (500 mg total) by mouth 2 (two) times daily for 10 days.   Dispense: 20 capsule; Refill: 0  Encounter for general adult medical examination with abnormal  findings Assessment & Plan: Patient Counseling(The following topics were reviewed):  Preventative care handout given to pt  Health maintenance and immunizations reviewed. Please refer to Health maintenance section. Pt advised on safe sex, wearing seatbelts in car, and proper nutrition labwork ordered today for annual Dental health: Discussed importance of regular tooth brushing, flossing, and dental visits.    Hyperglycemia -     Hemoglobin A1c  Acute cough -     Benzonatate; Take 1 capsule (200 mg total) by mouth 3 (three) times daily as needed for cough.  Dispense: 20 capsule; Refill: 0  Pneumococcal vaccination declined  Herpes zoster vaccination declined  Family history of heart disease -     Lipoprotein A (LPA)  At risk for diabetes mellitus -     Amb Ref to Medical Weight Management  Obesity (BMI 30-39.9) -     Amb Ref to Medical Weight Management      Follow-up: Return in about 1 year (around 03/18/2024) for f/u CPE.   Mort Sawyers, FNP

## 2023-03-19 NOTE — Assessment & Plan Note (Signed)
Strep tested positive in office.  rx for amox 500 mg po bid x 10 days.  Ibuprofen/tyelnol prn sore throat/fever Pt told to F/u if no improvement in the next 2-3 days.  

## 2023-03-20 ENCOUNTER — Encounter: Payer: Self-pay | Admitting: Family

## 2023-03-20 ENCOUNTER — Other Ambulatory Visit: Payer: Self-pay | Admitting: Family

## 2023-03-20 DIAGNOSIS — E782 Mixed hyperlipidemia: Secondary | ICD-10-CM

## 2023-03-20 MED ORDER — ROSUVASTATIN CALCIUM 10 MG PO TABS: 10.0000 mg | ORAL_TABLET | Freq: Every day | ORAL | 3 refills | Status: DC

## 2023-03-23 ENCOUNTER — Other Ambulatory Visit (HOSPITAL_BASED_OUTPATIENT_CLINIC_OR_DEPARTMENT_OTHER): Payer: Medicare Other

## 2023-03-26 LAB — LIPOPROTEIN A (LPA): Lipoprotein (a): 26 nmol/L (ref <75)

## 2023-03-27 ENCOUNTER — Ambulatory Visit (HOSPITAL_BASED_OUTPATIENT_CLINIC_OR_DEPARTMENT_OTHER)
Admission: RE | Admit: 2023-03-27 | Discharge: 2023-03-27 | Disposition: A | Discharge status: Home / Self Care | Payer: Medicare Other | Source: Ambulatory Visit | Attending: Hematology and Oncology | Admitting: Hematology and Oncology

## 2023-03-27 DIAGNOSIS — C50412 Malignant neoplasm of upper-outer quadrant of left female breast: Secondary | ICD-10-CM | POA: Insufficient documentation

## 2023-03-27 DIAGNOSIS — Z780 Asymptomatic menopausal state: Secondary | ICD-10-CM | POA: Diagnosis present

## 2023-03-27 DIAGNOSIS — Z170 Estrogen receptor positive status [ER+]: Secondary | ICD-10-CM | POA: Diagnosis present

## 2023-04-09 ENCOUNTER — Encounter (INDEPENDENT_AMBULATORY_CARE_PROVIDER_SITE_OTHER): Payer: Self-pay | Admitting: Family Medicine

## 2023-04-09 ENCOUNTER — Ambulatory Visit (INDEPENDENT_AMBULATORY_CARE_PROVIDER_SITE_OTHER): Payer: Medicare Other | Admitting: Family Medicine

## 2023-04-09 VITALS — BP 147/86 | HR 81 | Temp 98.1°F | Ht 65.0 in | Wt 179.0 lb

## 2023-04-09 DIAGNOSIS — E669 Obesity, unspecified: Secondary | ICD-10-CM | POA: Diagnosis not present

## 2023-04-09 DIAGNOSIS — Z6830 Body mass index (BMI) 30.0-30.9, adult: Secondary | ICD-10-CM

## 2023-04-09 DIAGNOSIS — E7849 Other hyperlipidemia: Secondary | ICD-10-CM

## 2023-04-09 DIAGNOSIS — Z0289 Encounter for other administrative examinations: Secondary | ICD-10-CM

## 2023-04-09 NOTE — Progress Notes (Signed)
Lisa Grippe, DO, ABFM, ABOM Bariatric physician [address] [place], [place], [place] [address] Office: [phone]  /  Fax: [phone]     Initial Evaluation:  Lisa Gallegos was seen in clinic today to evaluate for obesity. She is interested in losing weight to improve overall health and reduce the risk of weight related complications. She presents today to review program treatment options, initial physical assessment, and evaluation.      She was referred by: Friends   When asked how has your weight affected you? She states: Has affected self-esteem and Having fatigue  Contributing factors to her weight change: Reduced physical activity  Some associated conditions: Hyperlipidemia and Other: B12 Deficiency  Vitamin D Deficiency , and Breast Cancer.   Current nutrition plan: None  Current level of physical activity: Walking 10,000 steps a few times a wk and going to the gym 2 days a wk.  Current or previous pharmacotherapy: None  Response to medication: Never tried medications   Past Medical History:  Diagnosis Date   Anxiety    Cancer (HCC) 11/2020   left breast IDC   Family history of pancreatic cancer 12/16/2020   Malignant neoplasm of upper-outer quadrant of left breast in female, 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/03/2020   Personal history of radiation therapy      Objective:  BP (!) 147/86   Pulse 81   Temp 98.1 F (36.7 C)   Ht 5' 4.5" (1.638 m)   Wt 179 lb (81.2 kg)   LMP 02/17/2012   SpO2 98%   BMI 30.25 kg/m  She was weighed on the bioimpedance scale: Body mass index is 30.25 kg/m.  Visceral Fat %: 10, Body Fat %:36.8  No data recorded No data recorded   Vitals Temp: 98.1 F (36.7 C) BP: (!) 147/86 Pulse Rate: 81 SpO2: 98 %   Anthropometric Measurements Height: 5' 4.5" (1.638 m) Weight: 179 lb (81.2 kg) BMI (Calculated): 30.26 Peak Weight: 179lb   Body Composition  Body Fat %: 36.8 % Fat Mass (lbs): 66 lbs Muscle Mass (lbs):  107.6 lbs Total Body Water (lbs): 72.8 lbs Visceral Fat Rating : 10   Other Clinical Data Comments: info session    General: Well Developed, well nourished, and in no acute distress.  HEENT: Normocephalic, atraumatic Skin: Warm and dry, good turgor Chest:  Normal excursion, shape, no gross ABN Respiratory: no conversational dyspnea; speaking in full sentences NeuroM-Sk:  normal gross ROM * 4 extremities  Psych: A and O *3, insight adequate, mood- full    Assessment and Plan:   Other hyperlipidemia Assessment: Condition is not optimized. On 7/14/ 2022, she underwent coronary calcium scoring which demonstrated calcium score of 105 which is 85th percentile and was placed on Lipitor around 06/2021. She was switched to Crestor 10 mg daily at around 02/2022 due to myalgias and arthralgias and has been tolerating medication well. She sees cardiology once yearly.   Lab Results  Component Value Date   CHOL 270 (H) 03/19/2023   HDL 47.20 03/19/2023   LDLCALC 188 (H) 03/19/2023   TRIG 175.0 (H) 03/19/2023   CHOLHDL 6 03/19/2023   Plan: Continue with statin as recommended by PCP. Continue following up with Dr.O'Neal of cardiology.  Unless pre-existing renal or cardiopulmonary conditions exist which patient was told to limit their fluid intake by another provider, I recommended roughly one half of their weight in pounds, to be the approximate ounces of non-caloric, non-caffeinated beverages they should drink per day; including more if  they are engaging in exercise.   I discussed with pt that following a heart-heathy, low cholesterol meal plan can improve her components of hyperlipidemia. If pt decides to join program, we will begin routine screening as patient continues to achieve health goals along their weight loss journey     Obesity (BMI 30-39.9) Assessment: Condition is not optimized. Fat mass is 66 lbs. Muscle mass is 107.6 lbs.  Total body water is 72.8 lbs.  We discussed obesity as a  disease and the importance of a more detailed evaluation of all the factors contributing to the disease.  Plan:  My impression is that she would benefit from weight loss therapy via a modified calorie, low-carb, high-protein nutritional plan tailored to their REE (resting energy expenditure) which will be determined by indirect calorimetry at their next office visit.    Lisa Gallegos will complete provided nutritional and psychosocial assessment questionnaire before the next appointment.  - She will be scheduled for indirect calorimetry to determine resting energy expenditure in a fasting state.  This will allow [name] to create a reduced calorie, high-protein meal plan to promote loss of fat mass while preserving muscle mass.  - We will also assess for cardiometabolic risk and nutritional derangements via an ECG and fasting serologies at her next appointment.  - She was encouraged to work on amassing support from family and friends to begin their weight loss journey.   - Work on eliminating or reducing the presence of highly processed, poorly nutritious, calorie-dense foods in the home.  Obesity Education Performed Today: - Patient was counseled on nutritional approaches to weight loss and benefits of reducing processed foods and consuming plant-based foods and high quality protein as part of nutritional weight management program.   - We discussed the importance of long term lifestyle changes which include nutrition, exercise and behavioral modifications as well as the importance of customizing this to her specific health and social needs.   - We discussed the benefits of reaching a healthier weight to alleviate the symptoms of existing conditions and reduce the risks of the biomechanical, metabolic and psychological effects of obesity.  - Was counseled on the health benefits of losing 5%-10% of total body weight.  - Was counseled on our cognitive behavorial therapy program, lead by our  bariatric psychologist, who focuses on emotional eating and creating positive behavorial change.  - Was counseled on bariatric pharmacotherapy and how this may be used as an adjunct in their weight management   Lisa Gallegos appears to be in the action stage of change and states they are ready to start intensive lifestyle modifications and behavioral modifications.  It was recommended that she follow up in the next 1-2 weeks to review the above steps, and to continue with treatment of their chronic disease state of obesity  Attestations:  Reviewed by clinician on day of visit: allergies, medications, problem list, medical history, surgical history, family history, social history, and previous encounter notes pertinent to obesity diagnosis.  50 minutes was spent today on this visit including the above counseling, pre-visit chart review, and post-visit documentation.  Over 50% of this time was spent in direct, face-to-face counseling and coordination of care  I, Special Randolm Idol, acting as a medical scribe for Thomasene Lot, DO., have compiled all relevant documentation for today's office visit on behalf of Thomasene Lot, DO, while in the presence of Lisa & McLennan, DO.  I have reviewed the above documentation for accuracy and completeness, and I agree with the above. Gavin Pound  Dalbert Batman, D.O.  The 21st Century Cures Act was signed into law in 2016 which includes the topic of electronic health Gallegos.  This provides immediate access to information in MyChart.  This includes consultation notes, operative notes, office notes, lab results and pathology reports.  If you have any questions about what you read please let us know at your next visit so we can discuss your concerns and take corrective action if need be.  We are right here with you!

## 2023-04-22 ENCOUNTER — Other Ambulatory Visit: Payer: Self-pay | Admitting: Cardiovascular Disease

## 2023-04-23 ENCOUNTER — Ambulatory Visit: Payer: Medicare Other | Attending: General Surgery

## 2023-04-23 VITALS — Wt 183.5 lb

## 2023-04-23 DIAGNOSIS — Z483 Aftercare following surgery for neoplasm: Secondary | ICD-10-CM | POA: Insufficient documentation

## 2023-04-23 NOTE — Therapy (Signed)
OUTPATIENT PHYSICAL THERAPY SOZO SCREENING NOTE   Patient Name: Lisa Gallegos MRN: [id number] DOB:1956/07/15, 67 y.o., female Today's Date: 04/23/2023  PCP: Mort Sawyers, FNP REFERRING PROVIDER: Emelia Loron, MD   PT End of Session - 04/23/23 0911     Visit Number 4   # unchanged due to screen only   PT Start Time 0910    PT Stop Time 0914    PT Time Calculation (min) 4 min    Activity Tolerance Patient tolerated treatment well    Behavior During Therapy [facility] for tasks assessed/performed             Past Medical History:  Diagnosis Date   Anxiety    Cancer (HCC) 11/2020   left breast IDC   Family history of pancreatic cancer 12/16/2020   Malignant neoplasm of upper-outer quadrant of left breast in female, 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/03/2020   Personal history of radiation therapy    Past Surgical History:  Procedure Laterality Date   BREAST LUMPECTOMY WITH RADIOACTIVE SEED AND AXILLARY LYMPH NODE DISSECTION Left 12/23/2020   Procedure: LEFT BREAST LUMPECTOMY WITH RADIOACTIVE SEED AND LEFT AXILLARY LYMPH NODE BIOPSY;  Surgeon: Emelia Loron, MD;  Location: [facility];  Service: General;  Laterality: Left;  PEC BLOCK; START TIME OF 11:30 AM FOR 60 MINUTES ROOM 2 [place] [facility]   BREAST SURGERY     CESAREAN SECTION  1985   FTP   COLONOSCOPY WITH PROPOFOL N/A 12/28/2022   Procedure: COLONOSCOPY WITH PROPOFOL;  Surgeon: Toney Reil, MD;  Location: [facility];  Service: Gastroenterology;  Laterality: N/A;   KIDNEY STONE SURGERY     laser surgery   LAPAROSCOPIC APPENDECTOMY N/A 05/10/2018   Procedure: APPENDECTOMY LAPAROSCOPIC;  Surgeon: Glenna Fellows, MD;  Location: [facility] OR;  Service: General;  Laterality: N/A;   Patient Active Problem List   Diagnosis Date Noted   History of colon polyps 03/19/2023   Elevated LDL cholesterol level 03/19/2023   Arthralgia of both hands 03/07/2022   History of breast cancer 03/06/2022    Agatston coronary artery calcium score between 100 and 199 03/06/2022   Vitamin B12 deficiency 03/06/2022   On statin therapy 03/06/2022   Malignant neoplasm of upper-outer quadrant of left breast in female, 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/03/2020   Situational anxiety 04/05/2018   Vitamin D deficiency 04/05/2018   Hyperlipidemia 03/24/2015    REFERRING DIAG: left breast cancer at risk for lymphedema  THERAPY DIAG: Aftercare following surgery for neoplasm  PERTINENT HISTORY: Patient was diagnosed on 11/15/2020 with left grade II invasive ductal carcinoma breast cancer. Patient underwent a left lumpectomy and sentinel node biopsy (3 negative nodes) on 12/23/2020. It is ER/PR positive and HER2 negative with a Ki67 of 5%.   PRECAUTIONS: left UE Lymphedema risk, None  SUBJECTIVE: Pt returns for her first 6 month L-Dex screen.   PAIN:  Are you having pain? No  SOZO SCREENING: Patient was assessed today using the SOZO machine to determine the lymphedema index score. This was compared to her baseline score. It was determined that she is within the recommended range when compared to her baseline and no further action is needed at this time. She will continue SOZO screenings. These are done every 3 months for 2 years post operatively followed by every 6 months for 2 years, and then annually.   L-DEX FLOWSHEETS - 04/23/23 0900       L-DEX LYMPHEDEMA SCREENING   Measurement Type Unilateral    L-DEX MEASUREMENT  EXTREMITY Upper Extremity    POSITION  Standing    DOMINANT SIDE Right    At Risk Side Left    BASELINE SCORE (UNILATERAL) 2.2    L-DEX SCORE (UNILATERAL) 1    VALUE CHANGE (UNILAT) -1.2              Hermenia Bers, PTA 04/23/2023, 9:14 AM

## 2023-05-15 ENCOUNTER — Ambulatory Visit (INDEPENDENT_AMBULATORY_CARE_PROVIDER_SITE_OTHER): Payer: Medicare Other | Admitting: Family Medicine

## 2023-05-15 ENCOUNTER — Encounter (INDEPENDENT_AMBULATORY_CARE_PROVIDER_SITE_OTHER): Payer: Self-pay | Admitting: Family Medicine

## 2023-05-15 VITALS — BP 148/84 | HR 66 | Temp 98.1°F | Ht 65.0 in | Wt 180.0 lb

## 2023-05-15 DIAGNOSIS — R030 Elevated blood-pressure reading, without diagnosis of hypertension: Secondary | ICD-10-CM

## 2023-05-15 DIAGNOSIS — R5383 Other fatigue: Secondary | ICD-10-CM | POA: Diagnosis not present

## 2023-05-15 DIAGNOSIS — R0602 Shortness of breath: Secondary | ICD-10-CM

## 2023-05-15 DIAGNOSIS — E538 Deficiency of other specified B group vitamins: Secondary | ICD-10-CM | POA: Diagnosis not present

## 2023-05-15 DIAGNOSIS — E782 Mixed hyperlipidemia: Secondary | ICD-10-CM

## 2023-05-15 DIAGNOSIS — Z1331 Encounter for screening for depression: Secondary | ICD-10-CM

## 2023-05-15 DIAGNOSIS — D630 Anemia in neoplastic disease: Secondary | ICD-10-CM | POA: Diagnosis not present

## 2023-05-15 DIAGNOSIS — E669 Obesity, unspecified: Secondary | ICD-10-CM

## 2023-05-15 DIAGNOSIS — E559 Vitamin D deficiency, unspecified: Secondary | ICD-10-CM

## 2023-05-15 DIAGNOSIS — Z6830 Body mass index (BMI) 30.0-30.9, adult: Secondary | ICD-10-CM

## 2023-05-15 NOTE — Progress Notes (Signed)
Carlye Grippe, D.O.  ABFM, ABOM Specializing in Clinical Bariatric Medicine Office located at: [address]  [place], [place]  [address]     Bariatric Medicine Visit  Dear Mort Sawyers, FNP   Thank you for referring Kaveri Thiry to our clinic today for evaluation.  We performed a consultation to discuss her options for treatment and educate the patient on her disease state.  The following note includes my evaluation and treatment recommendations.   Please do not hesitate to reach out to me directly if you have any further concerns.   Assessment and Plan:   Orders Placed This Encounter  Procedures   CBC with Differential/Platelet   VITAMIN D 25 Hydroxy (Vit-D Deficiency, Fractures)   Comprehensive metabolic panel   Folate   Hemoglobin A1c   Insulin, random   Lipid Panel With LDL/HDL Ratio   T4, free   TSH   Vitamin B12   Magnesium   EKG 12-Lead    There are no discontinued medications.   No orders of the defined types were placed in this encounter.   Fatigue Assessment & Plan:   Sashi does feel that her weight is causing her energy to be lower than it should be. Fatigue may be related to obesity, depression or many other causes. she does not appear to have any red flag symptoms and this appears to most likely be related to her current lifestyle habits and dietary intake.  Labs will be ordered and reviewed with her at their next office visit in two weeks. Epworth sleepiness scale score appears to be within normal limits.  Her ESS score is 3.  Gentri denies daytime somnolence and denies waking up still tired. Ipek generally gets 7 or 8 hours of sleep per night, and states that she has generally restful sleep. Snoring is present. Apneic episodes is not present.  ECG: Performed and reviewed/ interpreted independently.  Normal sinus rhythm, rate 60 bpm; reassuring without any acute abnormalities, will continue to monitor for symptoms  Depression screen  [Z13.31]/Modified PHQ-9 Depression Screen: Her Food and Mood (modified PHQ-9) score was 2. Pt denies having a history of depression. Had slight anxiety after having 1st baby- symptoms resolved.  In the meanwhile, Mackinsey will focus on self care including making healthy food choices by following their meal plan, improving sleep quality and focusing on stress reduction.  Once we are assured she is on an appropriate meal plan, we will start discussing exercise to increase cardiovascular fitness levels.    Shortness of breath on exertion Assessment & Plan:  Ceiara does feel that she gets out of breath more easily than she used to when she exercises and seems to be worsening over time with weight gain.  This has gotten worse recently. Shi denies shortness of breath at rest or orthopnea. Kensington's shortness of breath appears to be obesity related and exercise induced, as they do not appear to have any "red flag" symptoms/ concerns today.  Also, this condition appears to be related to a state of poor cardiovascular conditioning   Obtain labs today and will be reviewed with her at their next office visit in two weeks. Indirect Calorimeter completed today to help guide our dietary regimen. It shows a VO2 of 233 and a REE of 1613.  Her calculated basal metabolic rate is 8119 thus her measured basal metabolic rate is better than expected. Patient agreed to work on weight loss at this time.  As Haylo progresses through our weight loss program,  we will gradually increase exercise as tolerated to treat her current condition.   If Nayma follows our recommendations and loses 5-10% of their weight without improvement of her shortness of breath or if at any time, symptoms become more concerning, they agree to urgently follow up with their PCP/ specialist for further consideration/ evaluation.   Kaidence verbalizes agreement with this plan.    Mixed hyperlipidemia Assessment & Plan: Reports having the diagnosis of  Hyperlipidemia. Condition is managed by cardiology. Has been compliant with Co Q10 200 mg daily, Omega-3 Fish Oil 1000 mg daily, and Crestor 10 mg daily.   Lab Results  Component Value Date   CHOL 270 (H) 03/19/2023   HDL 47.20 03/19/2023   LDLCALC 188 (H) 03/19/2023   TRIG 175.0 (H) 03/19/2023   CHOLHDL 6 03/19/2023   Check labs today. Continue with treatment plan per PCP/specialist and begin our heart-heathy, low cholesterol meal plan. We will begin routine screening as patient continues to achieve health goals along their weight loss journey   Anemia in neoplastic disease  On  nothing, has history of anmeia.  Recheck labs, and begin iron rich diet.      Vitamin B12 deficiency Assessment & Plan: Endorses having B12 deficiency and takes OTC B12 500 mcg daily.   Lab Results  Component Value Date   VITAMINB12 526 03/19/2023   Check labs today. Continue with B12 supplement at current dose and focus on focus on b12 rich foods such as lean red meats; poultry; eggs; seafood; beans, peas, and lentils; nuts and seeds; and soy products. We will begin to monitor as deemed clinically necessary.   Vitamin D deficiency Assessment & Plan:  Reports having Vitamin D deficiency and is compliant with OTC Vitamin D 5,000 units daily.   Lab Results  Component Value Date   VD25OH 42.22 03/19/2023   VD25OH 40.70 03/10/2022   VD25OH 27.01 (L) 04/05/2018   Check labs today. Maintain with OTC cholecalciferol supplement. Weight loss will likely improve availability of vitamin D, thus encouraged Evalisse to continue with meal plan and their weight loss efforts to further improve this condition.   White coat syndrome without diagnosis of hypertension Assessment & Plan: Blood pressure is elevated today. She is completely asymptomatic. Denies any chest pain, shortness of breath, and palpitations. Endorses having a history of high blood pressure at the doctors office. She checks her BP and heart rate  at home every day; BP is 120s/70s and heart rate is in 60s.   Last 3 blood pressure readings in our office are as follows: BP Readings from Last 3 Encounters:  05/15/23 (!) 148/84  04/09/23 (!) 147/86  03/19/23 130/82   Encouraged pt to continue monitoring blood pressure at home. Begin Prudent nutritional plan and low sodium diet, advance exercise as tolerated.   We will begin to monitor closely alongside PCP/ specialists.  Pt reminded to also f/up with those individuals as instructed by them. We will continue to monitor symptoms as they relate to the her weight loss journey.     TREATMENT PLAN FOR OBESITY: Obesity (BMI 30-39.9) - starting BMI 30.26 Assessment & Plan:  Since info-session visit on 04/09/23, Muscle mass has increased by 0.8 lb . Fat mass has not changed.Total body water has decreased by 1.8 lb.   Bristen is currently in the action stage of change. As such, her goal is to continue weight management plan. Alaja will work on healthier eating habits and try their best to follow the Category 1  meal plan with 8 ounces of lean protein at dinner best they can.   Behavioral Intervention Additional resources provided today: category 1 meal plan information Evidence-based interventions for health behavior change were utilized today including the discussion of self monitoring techniques, problem-solving barriers and SMART goal setting techniques.   Regarding patient's less desirable eating habits and patterns, we employed the technique of small changes.  Pt will specifically work on: beginning prescribed meal plan for next visit.    Recommended Physical Activity Goals Bricelyn has been advised to gradually work up to 150 minutes of moderate intensity aerobic activity a week and strengthening exercises 2-3 times per week for cardiovascular health, weight loss maintenance and preservation of muscle mass. She has agreed to continue their current level of activity  FOLLOW UP: Follow up in 2  weeks. She was informed of the importance of frequent follow up visits to maximize her success with intensive lifestyle modifications for her multiple health conditions.  Chavon Dush is aware that we will review all of her lab results at our next visit.  She is aware that if anything is critical/ life threatening with the results, we will be contacting her via MyChart prior to the office visit to discuss management.    Chief Complaint:   OBESITY Ive Ceesay (MR# [id number]) is a 67 y.o. female who presents for evaluation and treatment of obesity and related comorbidities. Current BMI is Body mass index is 29.95 kg/m. Mylove has been struggling with her weight for many years and has been unsuccessful in either losing weight, maintaining weight loss, or reaching her healthy weight goal.  Jhoseline Lampron is currently in the action stage of change and ready to dedicate time achieving and maintaining a healthier weight. Alyciana is interested in becoming our patient and working on intensive lifestyle modifications including (but not limited to) diet and exercise for weight loss.  Annasophia Phanor is retired. Patient is in a relationship with Ron and has 2 children. She lives with partner Ron.  Clinton Hollabaugh's habits were reviewed today and are as follows:   - Gained more weight after menopause.   - Desires to be 20 lbs in 4-6 months.  - Does walking and light weights 45 minutes, 2 days a wk.   - Has tried calorie counting and weight watchers in the past. WW worked best for her due to the accountability.   - Craves salty and sweet foods.   - Snacks on tea with biscotti and popcorn. Sometimes snacks after dinner.   - Drinks some caloric beverages: protein smoothies with fruit, unsweetened tea, soy milk, diet sodas (1-2 cans a wk)   - Sometimes tends to eat when bored; overall, she endorses that emotional eating is not a significant struggle for her.    Subjective:   This is the patient's first  visit at [facility].  The patient's NEW PATIENT PACKET that they filled out prior to today's office visit was reviewed at length and information from that paperwork was included within the following office visit note.    Included in the packet: current and past health history, medications, allergies, ROS, gynecologic history (women only), surgical history, family history, social history, weight history, weight loss surgery history (for those that have had weight loss surgery), nutritional evaluation, mood and food questionnaire along with a depression screening (PHQ9) on all patients, an Epworth questionnaire, sleep habits questionnaire, patient life and health improvement goals questionnaire. These will all be scanned into the patient's chart under  the "media" tab.   Review of Systems: Please refer to new patient packet scanned into media. Pertinent positives were addressed with patient today.  Reviewed by clinician on day of visit: allergies, medications, problem list, medical history, surgical history, family history, social history, and previous encounter notes.  During the visit, I independently reviewed the patient's EKG, bioimpedance scale results, and indirect calorimeter results. I used this information to tailor a meal plan for the patient that will help Khristen Garibaldo to lose weight and will improve her obesity-related conditions going forward.  I performed a medically necessary appropriate examination and/or evaluation. I discussed the assessment and treatment plan with the patient. The patient was provided an opportunity to ask questions and all were answered. The patient agreed with the plan and demonstrated an understanding of the instructions. Labs were ordered today (unless patient declined them) and will be reviewed with the patient at our next visit unless more critical results need to be addressed immediately. Clinical information was updated and documented in the EMR.    Objective:   PHYSICAL EXAM: Blood pressure (!) 148/84, pulse 66, temperature 98.1 F (36.7 C), height 5\' 5"  (1.651 m), weight 180 lb (81.6 kg), last menstrual period 02/17/2012, SpO2 98%. Body mass index is 29.95 kg/m. General: Well Developed, well nourished, and in no acute distress.  HEENT: Normocephalic, atraumatic Skin: Warm and dry, cap RF less 2 sec, good turgor Chest:  Normal excursion, shape, no gross abn Respiratory: speaking in full sentences, no conversational dyspnea NeuroM-Sk: Ambulates w/o assistance, moves * 4 Psych: A and O *3, insight good, mood-full  Anthropometric Measurements Height: 5\' 5"  (1.651 m) Weight: 180 lb (81.6 kg) BMI (Calculated): 29.95 Weight at Last Visit: n/a Weight Lost Since Last Visit: n/a Weight Gained Since Last Visit: n/a Starting Weight: 180 Total Weight Loss (lbs): 0 lb (0 kg) Peak Weight: 180lb Waist Measurement : 36 inches   Body Composition  Body Fat %: 36.6 % Fat Mass (lbs): 66 lbs Muscle Mass (lbs): 180.4 lbs Total Body Water (lbs): 71 lbs Visceral Fat Rating : 10   Other Clinical Data RMR: [id number] Fasting: yes Labs: yes Today's Visit #: 1 Starting Date: 05/15/23 Comments: first visit    DIAGNOSTIC DATA REVIEWED:  BMET    Component Value Date/Time   NA 141 03/19/2023 1153   K 4.6 03/19/2023 1153   CL 102 03/19/2023 1153   CO2 30 03/19/2023 1153   GLUCOSE 94 03/19/2023 1153   BUN 17 03/19/2023 1153   CREATININE 0.88 03/19/2023 1153   CREATININE 0.88 12/15/2020 0833   CALCIUM 9.6 03/19/2023 1153   GFRNONAA >60 12/15/2020 0833   GFRAA >60 05/09/2018 2219   Lab Results  Component Value Date   HGBA1C 5.6 03/19/2023   HGBA1C 5.8 09/15/2014   No results found for: "INSULIN" Lab Results  Component Value Date   TSH 1.95 03/19/2023   CBC    Component Value Date/Time   WBC 6.2 03/10/2022 0821   RBC 4.62 03/10/2022 0821   HGB 13.6 03/10/2022 0821   HGB 13.6 12/15/2020 0833   HCT 40.7 03/10/2022 0821    PLT 234.0 03/10/2022 0821   PLT 267 12/15/2020 0833   MCV 88.0 03/10/2022 0821   MCH 28.4 12/15/2020 0833   MCHC 33.3 03/10/2022 0821   RDW 13.2 03/10/2022 0821   Iron Studies No results found for: "IRON", "TIBC", "FERRITIN", "IRONPCTSAT" Lipid Panel     Component Value Date/Time   CHOL 270 (H) 03/19/2023 1153   CHOL  214 (H) 03/24/2022 0949   TRIG 175.0 (H) 03/19/2023 1153   HDL 47.20 03/19/2023 1153   HDL 49 03/24/2022 0949   CHOLHDL 6 03/19/2023 1153   VLDL 35.0 03/19/2023 1153   LDLCALC 188 (H) 03/19/2023 1153   LDLCALC 141 (H) 03/24/2022 0949   Hepatic Function Panel     Component Value Date/Time   PROT 7.5 03/19/2023 1153   ALBUMIN 4.4 03/19/2023 1153   AST 21 03/19/2023 1153   AST 26 12/15/2020 0833   ALT 23 03/19/2023 1153   ALT 36 12/15/2020 0833   ALKPHOS 119 (H) 03/19/2023 1153   BILITOT 0.4 03/19/2023 1153   BILITOT 0.4 12/15/2020 0833      Component Value Date/Time   TSH 1.95 03/19/2023 1153   Nutritional Lab Results  Component Value Date   VD25OH 42.22 03/19/2023   VD25OH 40.70 03/10/2022   VD25OH 27.01 (L) 04/05/2018    Attestation Statements:   I, Special Puri , acting as a Stage manager for Thomasene Lot, DO., have compiled all relevant documentation for today's office visit on behalf of Thomasene Lot, DO, while in the presence of [employer], DO.  Time spent on visit including pre-visit chart review and post-visit care was estimated to be 60  minutes. Over 50% of the time was spent in direct face to face counseling and coordination of care.  I have reviewed the above documentation for accuracy and completeness, and I agree with the above. Carlye Grippe, D.O.  The 21st Century Cures Act was signed into law in 2016 which includes the topic of electronic health records.  This provides immediate access to information in MyChart.  This includes consultation notes, operative notes, office notes, lab results and pathology reports.  If you  have any questions about what you read please let us know at your next visit so we can discuss your concerns and take corrective action if need be.  We are right here with you.

## 2023-05-29 ENCOUNTER — Ambulatory Visit (INDEPENDENT_AMBULATORY_CARE_PROVIDER_SITE_OTHER): Payer: Medicare Other | Admitting: Family Medicine

## 2023-05-29 ENCOUNTER — Other Ambulatory Visit (INDEPENDENT_AMBULATORY_CARE_PROVIDER_SITE_OTHER): Payer: Self-pay | Admitting: Family Medicine

## 2023-05-29 ENCOUNTER — Encounter (INDEPENDENT_AMBULATORY_CARE_PROVIDER_SITE_OTHER): Payer: Self-pay | Admitting: Family Medicine

## 2023-05-29 VITALS — BP 129/82 | HR 68 | Temp 98.4°F | Ht 65.0 in | Wt 175.0 lb

## 2023-05-29 DIAGNOSIS — Z6829 Body mass index (BMI) 29.0-29.9, adult: Secondary | ICD-10-CM

## 2023-05-29 DIAGNOSIS — D630 Anemia in neoplastic disease: Secondary | ICD-10-CM | POA: Diagnosis not present

## 2023-05-29 DIAGNOSIS — E88819 Insulin resistance, unspecified: Secondary | ICD-10-CM | POA: Diagnosis not present

## 2023-05-29 DIAGNOSIS — K5909 Other constipation: Secondary | ICD-10-CM

## 2023-05-29 DIAGNOSIS — E669 Obesity, unspecified: Secondary | ICD-10-CM

## 2023-05-29 DIAGNOSIS — E782 Mixed hyperlipidemia: Secondary | ICD-10-CM

## 2023-05-29 DIAGNOSIS — E559 Vitamin D deficiency, unspecified: Secondary | ICD-10-CM

## 2023-05-29 DIAGNOSIS — E538 Deficiency of other specified B group vitamins: Secondary | ICD-10-CM | POA: Diagnosis not present

## 2023-05-29 DIAGNOSIS — Z87898 Personal history of other specified conditions: Secondary | ICD-10-CM

## 2023-05-29 MED ORDER — POLYETHYLENE GLYCOL 3350 17 G PO PACK
17.0000 g | PACK | Freq: Two times a day (BID) | ORAL | 0 refills | Status: DC
Start: 2023-05-29 — End: 2024-01-09

## 2023-05-29 NOTE — Progress Notes (Signed)
Lisa Gallegos, D.O.  ABFM, ABOM Clinical Bariatric Medicine Physician  Office located at: [address] [place], [place]  [address]     Assessment and Plan:   Meds ordered this encounter  Medications   polyethylene glycol (MIRALAX / GLYCOLAX) 17 g packet    Sig: Take 17 g by mouth 2 (two) times daily. Until stooling regularly    Dispense:  60 packet    Refill:  0     History of prediabetes/Insulin resistance Assessment & Plan: This condition is new to [name]. Pt did not inform us of her prior history of prediabetes at last OV-  upon detailed review of chart she had an A1c of 5.7 roughly 6 yrs ago. Her Insulin levels are roughly 2 times normal. Sodium levels suggest that she's quite well hydrated. Overall, kidney function and electrolytes appear to be within normal limits. Alkaline Phosphatase level is elevated - pt is on Arimidex 1 mg daily per Dr.Gudena and was told that these levels would be elevated due to the drug. No concerns with other liver enzymes or thyroid levels. Magnesium: 2.2   Lab Results  Component Value Date   HGBA1C 5.6 05/15/2023   HGBA1C 5.6 03/19/2023   HGBA1C 5.7 01/02/2017   INSULIN 11.7 05/15/2023    Lab Results  Component Value Date   CREATININE 0.87 05/15/2023   BUN 17 05/15/2023   NA 139 05/15/2023   K 4.4 05/15/2023   CL 101 05/15/2023   CO2 23 05/15/2023      Component Value Date/Time   PROT 7.3 05/15/2023 1058   ALBUMIN 4.6 05/15/2023 1058   AST 22 05/15/2023 1058   AST 26 12/15/2020 0833   ALT 21 05/15/2023 1058   ALT 36 12/15/2020 0833   ALKPHOS 145 (H) 05/15/2023 1058   BILITOT 0.4 05/15/2023 1058   BILITOT 0.4 12/15/2020 6295   Lab Results  Component Value Date   TSH 1.240 05/15/2023   FREET4 1.47 05/15/2023    Unless pre-existing renal or cardiopulmonary conditions exist which patient was told to limit their fluid intake by another provider, I recommended roughly one half of their weight in pounds, to be the approximate  ounces of non-caloric, non-caffeinated beverages they should drink per day; including more if they are engaging in exercise.   I counseled patient on pathophysiology of the disease process of I.R. Explained role of simple carbs and insulin levels on hunger and cravings. Educated patient that having protein with each meal is important for stabilizing sugars and controlling hunger and cravings.  Continue to decrease simple carbs/ sugars; increase fiber and proteins -> follow her meal plan.    Vitamin D deficiency Assessment & Plan: Vitamin D levels are within the recommended range of 50 to 70-80. Current treatment: OTC Vitamin D 5,000 units - she notes taking it roughly 5 times a wk.   Lab Results  Component Value Date   VD25OH 52.6 05/15/2023   VD25OH 42.22 03/19/2023   VD25OH 40.70 03/10/2022   Maintain with OTC Vitamin D at current dose. Will continue to monitor condition.    Vitamin B12 deficiency Assessment & Plan: B12 level is 858 and at goal of over 500. Reports taking OTC B12 500 mcg daily. Folate levels are also within normal limits.   Lab Results  Component Value Date   VITAMINB12 858 05/15/2023   FOLATE 12.4 05/15/2023    Continue with OTC B12 supplement and our B12 rich diet. Will recheck levels in 3-4 months.  Anemia in neoplastic disease Assessment & Plan: Reports having a history of anemia, not taking any iron supplements. Blood counts are stable and not indicative of anemia.   Lab Results  Component Value Date   WBC 6.9 05/15/2023   HGB 14.5 05/15/2023   HCT 43.4 05/15/2023   MCV 88 05/15/2023   PLT 237 05/15/2023   Continue with Iron-Rich Diet. Will monitor condition as deemed appropriate.    Mixed hyperlipidemia Assessment & Plan: Hyperlipidemia treated with Crestor 10 mg daily, Co Q10 200 mg daily, & Omega-3 Fish Oil 1,000 mg daily. Her total cholesterol and LDL are still elevated however have improved from 214 to 203 and 141 to 121 respectively. HDL  levels are normal at 56; goal > 60.   Lab Results  Component Value Date   CHOL 203 (H) 05/15/2023   HDL 56 05/15/2023   LDLCALC 121 (H) 05/15/2023   TRIG 145 05/15/2023   CHOLHDL 6 03/19/2023   The 10-year ASCVD risk score (Arnett DK, et al., 2019) is: 5.7%   Values used to calculate the score:     Age: 3 years     Sex: Female     Is Non-Hispanic African American: No     Diabetic: No     Tobacco smoker: No     Systolic Blood Pressure: 129 mmHg     Is BP treated: No     HDL Cholesterol: 56 mg/dL     Total Cholesterol: 203 mg/dL   Continue with Crestor and Co Q10 at current dose. Pt has been instructed to increase Omega-3 Fish Oil to 3,000-4,000 mg per day. Reminded pt to limit her red meat intake to 2 x a wk. I stressed the importance that patient continue with our prudent nutritional plan that is low in saturated and trans fats, and low in fatty carbs to improve these numbers. We recommend: aerobic activity with eventual goal of a minimum of 150+ min wk plus 2 days/ week of resistance or strength training.     Other constipation Assessment & Plan: Pt endorses lately having a decrease in bowel movement frequency - this could be from recently changing her diet/increasing protein. Reports drinking ~ 64 ounces of water daily.   Patient advised to begin MiraLAX twice daily, until stooling regularly.   Your goal is to have one soft bowel movement each day. Drink at least half of your weight in ounces of water per day unless otherwise noted by one of your doctors that you must restrict water intake.  Eat plenty of fiber (goal is over 25 grams each day).  It is best to get most of your fiber from dietary sources which includes leafy green vegetables, fresh fruit, and whole grains. If all of these changes do not work, contact your PCP.   TREATMENT PLAN FOR OBESITY: Obesity (BMI 30-39.9) Assessment & Plan: [name] is here to discuss her progress with her obesity treatment plan along  with follow-up of her obesity related diagnoses. See Medical Weight Management Flowsheet for complete bioelectrical impedance results.  Condition is improving, but not quite optimized.  Since last office visit on 05/15/23, patient's muscle mass has decreased by 0.8 lb. Fat mass has decreased by 3.6 lb. Total body water has decreased by 1.4 lb.  Counseling done on how various foods will affect these numbers and how to maximize success  Total lbs lost to date: 5 lbs Total weight loss percentage to date: 2.78%    Meal Plan: Category 1 with  B/L options and 8 ounces of lean protein at dinner. She also has the option to journal breakfast (200-250 calories, 20+ grams protein)   - Recommended Kiairra to try Protein Overnight Oats and the [employer] Carb Counter tortillas.   - Recommended her to also use Chat GPT to find new recipes.   Behavioral Intervention Additional resources provided today: breakfast options, lunch options, and Insulin Resistance/Prediabetes Handout Evidence-based interventions for health behavior change were utilized today including the discussion of self monitoring techniques, problem-solving barriers and SMART goal setting techniques.   Regarding patient's less desirable eating habits and patterns, we employed the technique of small changes.  Pt will specifically work on: continuing with prescribed meal plan for next visit.   FOLLOW UP: Return in 23 days (on 06/21/2023). She was informed of the importance of frequent follow up visits to maximize her success with intensive lifestyle modifications for her multiple health conditions.  Subjective:   Chief complaint: Obesity [place] is here to discuss her progress with her obesity treatment plan. She is on the Category 1 Plan and 8 ounces of lean protein at dinner and states she is following her eating plan approximately 90% of the time. She states she is walking 20 minutes 7 days per week and doing weights 15-20 minutes, 5 days a  wk   Interval History:  Neelie Men is here today for her first follow-up office visit since starting the program with [name].   - She was able to eat all the foods on the meal plan and felt full - no complaints of hunger/cravings.   - She is focusing on lean proteins.   - Disliked the sandwiches at lunch and requests for extra lunch options.   - Also, disliked the [employer].   - Sometimes went slightly over the 100 snack calories, for snacks she had sugar free jello.   - Drinks roughly 64 ounces of water daily.   All blood work/ lab tests that were recently ordered by myself or an outside provider were reviewed with patient today per their request. Extended time was spent counseling her on all new disease processes that were discovered or preexisting ones that are affected by BMI.  she understands that many of these abnormalities will need to monitored regularly along with the current treatment plan of prudent dietary changes, in which we are making each and every office visit, to improve these health parameters.  Review of Systems:  Pertinent positives were addressed with patient today.  Weight Summary and Biometrics   Weight Lost Since Last Visit: 5lb  Weight Gained Since Last Visit: 0lb   Vitals Temp: 98.4 F (36.9 C) BP: 129/82 Pulse Rate: 68 SpO2: 97 %   Anthropometric Measurements Height: 5\' 5"  (1.651 m) Weight: 175 lb (79.4 kg) BMI (Calculated): 29.12 Weight at Last Visit: 180lb Weight Lost Since Last Visit: 5lb Weight Gained Since Last Visit: 0lb Starting Weight: 180 Total Weight Loss (lbs): 5 lb (2.268 kg) Peak Weight: 180lb   Body Composition  Body Fat %: 35.5 % Fat Mass (lbs): 62.4 lbs Muscle Mass (lbs): 107.6 lbs Total Body Water (lbs): 69.6 lbs Visceral Fat Rating : 10   Other Clinical Data Fasting: no Labs: no Today's Visit #: 2 Starting Date: 05/15/23   Objective:   PHYSICAL EXAM:  Blood pressure 129/82, pulse 68, temperature 98.4  F (36.9 C), height 5\' 5"  (1.651 m), weight 175 lb (79.4 kg), last menstrual period 02/17/2012, SpO2 97%. Body mass index is 29.12 kg/m.  General: Well Developed, well nourished, and in no acute distress.  HEENT: Normocephalic, atraumatic Skin: Warm and dry, cap RF less 2 sec, good turgor Chest:  Normal excursion, shape, no gross abn Respiratory: speaking in full sentences, no conversational dyspnea NeuroM-Sk: Ambulates w/o assistance, moves * 4 Psych: A and O *3, insight good, mood-full  DIAGNOSTIC DATA REVIEWED:  BMET    Component Value Date/Time   NA 139 05/15/2023 1058   K 4.4 05/15/2023 1058   CL 101 05/15/2023 1058   CO2 23 05/15/2023 1058   GLUCOSE 87 05/15/2023 1058   GLUCOSE 94 03/19/2023 1153   BUN 17 05/15/2023 1058   CREATININE 0.87 05/15/2023 1058   CREATININE 0.88 12/15/2020 0833   CALCIUM 9.6 05/15/2023 1058   GFRNONAA >60 12/15/2020 0833   GFRAA >60 05/09/2018 2219   Lab Results  Component Value Date   HGBA1C 5.6 05/15/2023   HGBA1C 5.8 09/15/2014   Lab Results  Component Value Date   INSULIN 11.7 05/15/2023   Lab Results  Component Value Date   TSH 1.240 05/15/2023   CBC    Component Value Date/Time   WBC 6.9 05/15/2023 1058   WBC 6.2 03/10/2022 0821   RBC 4.95 05/15/2023 1058   RBC 4.62 03/10/2022 0821   HGB 14.5 05/15/2023 1058   HCT 43.4 05/15/2023 1058   PLT 237 05/15/2023 1058   MCV 88 05/15/2023 1058   MCH 29.3 05/15/2023 1058   MCH 28.4 12/15/2020 0833   MCHC 33.4 05/15/2023 1058   MCHC 33.3 03/10/2022 0821   RDW 13.0 05/15/2023 1058   Iron Studies No results found for: "IRON", "TIBC", "FERRITIN", "IRONPCTSAT" Lipid Panel     Component Value Date/Time   CHOL 203 (H) 05/15/2023 1058   TRIG 145 05/15/2023 1058   HDL 56 05/15/2023 1058   CHOLHDL 6 03/19/2023 1153   VLDL 35.0 03/19/2023 1153   LDLCALC 121 (H) 05/15/2023 1058   Hepatic Function Panel     Component Value Date/Time   PROT 7.3 05/15/2023 1058   ALBUMIN 4.6  05/15/2023 1058   AST 22 05/15/2023 1058   AST 26 12/15/2020 0833   ALT 21 05/15/2023 1058   ALT 36 12/15/2020 0833   ALKPHOS 145 (H) 05/15/2023 1058   BILITOT 0.4 05/15/2023 1058   BILITOT 0.4 12/15/2020 0833      Component Value Date/Time   TSH 1.240 05/15/2023 1058   Nutritional Lab Results  Component Value Date   VD25OH 52.6 05/15/2023   VD25OH 42.22 03/19/2023   VD25OH 40.70 03/10/2022    Attestations:   Reviewed by clinician on day of visit: allergies, medications, problem list, medical history, surgical history, family history, social history, and previous encounter notes.   Patient was in the office today and time spent on visit including pre-visit chart review and post-visit care/coordination of care and electronic medical record documentation was 55 minutes. 50% of the time was in face to face counseling of this patient's medical condition(s) and providing education on treatment options to include the first-line treatment of diet and lifestyle modification.  I, Special Randolm Idol, acting as a Stage manager for [employer], DO., have compiled all relevant documentation for today's office visit on behalf of Thomasene Lot, DO, while in the presence of [employer], DO.  I have reviewed the above documentation for accuracy and completeness, and I agree with the above. Lisa Gallegos, D.O.  The 21st Century Cures Act was signed into law in 2016 which includes the topic of  electronic health records.  This provides immediate access to information in MyChart.  This includes consultation notes, operative notes, office notes, lab results and pathology reports.  If you have any questions about what you read please let us know at your next visit so we can discuss your concerns and take corrective action if need be.  We are right here with you.

## 2023-05-29 NOTE — Patient Instructions (Signed)
The 10-year ASCVD risk score (Arnett DK, et al., 2019) is: 5.7%   Values used to calculate the score:     Age: 67 years     Sex: Female     Is Non-Hispanic African American: No     Diabetic: No     Tobacco smoker: No     Systolic Blood Pressure: 129 mmHg     Is BP treated: No     HDL Cholesterol: 56 mg/dL     Total Cholesterol: 203 mg/dL

## 2023-06-13 ENCOUNTER — Ambulatory Visit (INDEPENDENT_AMBULATORY_CARE_PROVIDER_SITE_OTHER): Payer: BLUE CROSS/BLUE SHIELD | Admitting: Family Medicine

## 2023-06-21 ENCOUNTER — Ambulatory Visit (INDEPENDENT_AMBULATORY_CARE_PROVIDER_SITE_OTHER): Payer: Medicare Other | Admitting: Physician Assistant

## 2023-06-21 ENCOUNTER — Encounter (INDEPENDENT_AMBULATORY_CARE_PROVIDER_SITE_OTHER): Payer: Self-pay | Admitting: Physician Assistant

## 2023-06-21 VITALS — BP 132/78 | HR 68 | Temp 98.2°F | Ht 65.0 in | Wt 172.0 lb

## 2023-06-21 DIAGNOSIS — Z6828 Body mass index (BMI) 28.0-28.9, adult: Secondary | ICD-10-CM

## 2023-06-21 DIAGNOSIS — E785 Hyperlipidemia, unspecified: Secondary | ICD-10-CM

## 2023-06-21 DIAGNOSIS — E669 Obesity, unspecified: Secondary | ICD-10-CM | POA: Insufficient documentation

## 2023-06-21 DIAGNOSIS — E88819 Insulin resistance, unspecified: Secondary | ICD-10-CM

## 2023-06-21 DIAGNOSIS — E559 Vitamin D deficiency, unspecified: Secondary | ICD-10-CM | POA: Diagnosis not present

## 2023-06-21 DIAGNOSIS — Z853 Personal history of malignant neoplasm of breast: Secondary | ICD-10-CM

## 2023-06-21 DIAGNOSIS — E782 Mixed hyperlipidemia: Secondary | ICD-10-CM

## 2023-06-21 NOTE — Progress Notes (Signed)
.smr  Office: [phone]  /  Fax: [phone]  WEIGHT SUMMARY AND BIOMETRICS  Vitals Temp: 98.2 F (36.8 C) BP: 132/78 Pulse Rate: 68 SpO2: 100 %   Anthropometric Measurements Height: 5\' 5"  (1.651 m) Weight: 172 lb (78 kg) BMI (Calculated): 28.62 Weight at Last Visit: 175 lb Weight Lost Since Last Visit: 3 lb Weight Gained Since Last Visit: 0 Starting Weight: 180 lb Total Weight Loss (lbs): 8 lb (3.629 kg) Peak Weight: 180 lb   Body Composition  Body Fat %: 35.2 % Fat Mass (lbs): 60.8 lbs Muscle Mass (lbs): 106.4 lbs Total Body Water (lbs): 70.8 lbs Visceral Fat Rating : 9   Other Clinical Data Fasting: no Labs: no Today's Visit #: 3 Starting Date: 05/15/23     HPI  Chief Complaint: OBESITY  Lisa Gallegos is here to discuss her progress with her obesity treatment plan. She is on the the Category 1 Plan with B&L options/ and states she is following her eating plan approximately 90 % of the time. She states she is exercising walking 30 minutes 3 times per week. Discussed the use of AI scribe software for clinical note transcription with the patient, who gave verbal consent to proceed.  History of Present Illness  /  Interval History:  Since last office visit she is down 3 lbs.     A 67 year old female with a history of prediabetes, insulin resistance, vitamin D deficiency, vitamin B12 deficiency, mixed hyperlipidemia, and breast cancer on anastrozole presents for a follow-up visit for obesity management. The patient reports a weight loss of 8 pounds since the last visit and a decrease in BMI to 28.6. She has been mindful of her diet, focusing on high protein and low carb intake. She has also been active, engaging in regular walking and light weight exercises. She reports improved sleep and a decrease in resting heart rate. The patient is aiming for a weight goal of 160 pounds.   Has trip to [place] in December for [place]   Pharmacotherapy: None for weight  loss  TREATMENT PLAN FOR OBESITY: Obesity Patient has lost 8 pounds since starting the program in late July. BMI has decreased to 28.6. Body adipose percent is at the goal of 35. Visceral adipose rating is 9, which is below the goal of 12. Patient is maintaining muscle mass. She is on a calorie-restricted diet (1000-1100 calories/day) with a focus on high protein and low carb intake. She is also exercising regularly. -Continue current diet and exercise regimen. -Consider using My Fitness Pal for journaling and tracking macros. -Consider adding psyllium husk capsules for additional fiber intake. -Explore new recipes on SkinnyTaste.com for variety in meals.  Recommended Dietary Goals  Jolana is currently in the action stage of change. As such, her goal is to continue weight management plan. She has agreed to the Category 1 Plan and keeping a food journal and adhering to recommended goals of 1000-1100 calories and 75 + grams of protein.  Behavioral Intervention  We discussed the following Behavioral Modification Strategies today: increasing lean protein intake, decreasing simple carbohydrates , increasing vegetables, increasing lower glycemic fruits, increasing fiber rich foods, increasing water intake, work on meal planning and preparation, work on Counselling psychologist calories using tracking application, emotional eating strategies and understanding the difference between hunger signals and cravings, continue to practice mindfulness when eating, and planning for success.  Additional resources provided today: NA  Recommended Physical Activity Goals  Jackaline has been advised to work up to 150 minutes  of moderate intensity aerobic activity a week and strengthening exercises 2-3 times per week for cardiovascular health, weight loss maintenance and preservation of muscle mass.   She has agreed to Continue current level of physical activity    Pharmacotherapy We discussed various medication  options to help Janesa with her weight loss efforts and we both agreed to continue to work on nutritional and behavioral strategies to promote weight loss.     Return in about 2 weeks (around 07/05/2023).Marland Kitchen She was informed of the importance of frequent follow up visits to maximize her success with intensive lifestyle modifications for her multiple health conditions.  PHYSICAL EXAM:  Blood pressure 132/78, pulse 68, temperature 98.2 F (36.8 C), height 5\' 5"  (1.651 m), weight 172 lb (78 kg), last menstrual period 02/17/2012, SpO2 100%. Body mass index is 28.62 kg/m.  General: She is overweight, cooperative, alert, well developed, and in no acute distress. PSYCH: Has normal mood, affect and thought process.   Cardiovascular: HR 60's , BP 132/78 Lungs: Normal breathing effort, no conversational dyspnea. Neuro: no focal deficits  DIAGNOSTIC DATA REVIEWED:  BMET    Component Value Date/Time   NA 139 05/15/2023 1058   K 4.4 05/15/2023 1058   CL 101 05/15/2023 1058   CO2 23 05/15/2023 1058   GLUCOSE 87 05/15/2023 1058   GLUCOSE 94 03/19/2023 1153   BUN 17 05/15/2023 1058   CREATININE 0.87 05/15/2023 1058   CREATININE 0.88 12/15/2020 0833   CALCIUM 9.6 05/15/2023 1058   GFRNONAA >60 12/15/2020 0833   GFRAA >60 05/09/2018 2219   Lab Results  Component Value Date   HGBA1C 5.6 05/15/2023   HGBA1C 5.8 09/15/2014   Lab Results  Component Value Date   INSULIN 11.7 05/15/2023   Lab Results  Component Value Date   TSH 1.240 05/15/2023   CBC    Component Value Date/Time   WBC 6.9 05/15/2023 1058   WBC 6.2 03/10/2022 0821   RBC 4.95 05/15/2023 1058   RBC 4.62 03/10/2022 0821   HGB 14.5 05/15/2023 1058   HCT 43.4 05/15/2023 1058   PLT 237 05/15/2023 1058   MCV 88 05/15/2023 1058   MCH 29.3 05/15/2023 1058   MCH 28.4 12/15/2020 0833   MCHC 33.4 05/15/2023 1058   MCHC 33.3 03/10/2022 0821   RDW 13.0 05/15/2023 1058   Iron Studies No results found for: "IRON", "TIBC",  "FERRITIN", "IRONPCTSAT" Lipid Panel     Component Value Date/Time   CHOL 203 (H) 05/15/2023 1058   TRIG 145 05/15/2023 1058   HDL 56 05/15/2023 1058   CHOLHDL 6 03/19/2023 1153   VLDL 35.0 03/19/2023 1153   LDLCALC 121 (H) 05/15/2023 1058   Hepatic Function Panel     Component Value Date/Time   PROT 7.3 05/15/2023 1058   ALBUMIN 4.6 05/15/2023 1058   AST 22 05/15/2023 1058   AST 26 12/15/2020 0833   ALT 21 05/15/2023 1058   ALT 36 12/15/2020 0833   ALKPHOS 145 (H) 05/15/2023 1058   BILITOT 0.4 05/15/2023 1058   BILITOT 0.4 12/15/2020 0833      Component Value Date/Time   TSH 1.240 05/15/2023 1058   Nutritional Lab Results  Component Value Date   VD25OH 52.6 05/15/2023   VD25OH 42.22 03/19/2023   VD25OH 40.70 03/10/2022    ASSOCIATED CONDITIONS ADDRESSED TODAY  ASSESSMENT AND PLAN  Problem List Items Addressed This Visit     Hyperlipidemia   Vitamin D deficiency   Insulin resistance - new onset -  Primary   Generalized obesity- Starting BMI 30.26   BMI 28.0-28.9,adult Current BMI 28.8    Insulin Resistance Last fasting insulin was 11.7- not at goal. A1c was 5.6- just at goal. She is working on nutrition plan to decrease simple carbohydrates, increase lean proteins and exercise to promote weight loss, improve glycemic control and prevent progression to Type 2 diabetes.   Polyphagia:No Lab Results  Component Value Date   HGBA1C 5.6 05/15/2023   HGBA1C 5.6 03/19/2023   HGBA1C 5.7 01/02/2017   HGBA1C 5.8 09/15/2014   Lab Results  Component Value Date   INSULIN 11.7 05/15/2023    Plan:  Patient is managing her  insulin resistance through diet and exercise. Continue working on nutrition plan to decrease simple carbohydrates, increase lean proteins and exercise to promote weight loss, improve glycemic control and prevent progression to Type 2 diabetes.    Vitamin D Deficiency Patient is taking over-the-counter vitamin D 5000 units daily. No N/V or  muscle weakness with OTC vitamin D. Lab Results  Component Value Date   VD25OH 52.6 05/15/2023   VD25OH 42.22 03/19/2023   VD25OH 40.70 03/10/2022  Plan;  -Continue current supplementation. -Continue monitoring vitamin D levels every 3-4 months to optimize supplementation/avoid over supplementation.  Hyperlipidemia LDL is not at goal. Medication(s): Rosuvastatin 10 mg daily.  Cardiovascular risk factors: advanced age (older than 30 for men, 69 for women), dyslipidemia, and family history of premature cardiovascular disease  Lab Results  Component Value Date   CHOL 203 (H) 05/15/2023   HDL 56 05/15/2023   LDLCALC 121 (H) 05/15/2023   TRIG 145 05/15/2023   CHOLHDL 6 03/19/2023   CHOLHDL 4.4 03/24/2022   CHOLHDL 4 03/10/2022   Lab Results  Component Value Date   ALT 21 05/15/2023   AST 22 05/15/2023   ALKPHOS 145 (H) 05/15/2023   BILITOT 0.4 05/15/2023   The 10-year ASCVD risk score (Arnett DK, et al., 2019) is: 5.9%   Values used to calculate the score:     Age: 73 years     Sex: Female     Is Non-Hispanic African American: No     Diabetic: No     Tobacco smoker: No     Systolic Blood Pressure: 132 mmHg     Is BP treated: No     HDL Cholesterol: 56 mg/dL     Total Cholesterol: 203 mg/dL  Plan: Continue statin. Continue to work on nutrition plan -decreasing simple carbohydrates, increasing lean proteins, decreasing saturated fats and cholesterol , avoiding trans fats and exercise as able to promote weight loss, improve lipids and decrease cardiovascular risks.    History of Breast Cancer Patient is on anastrozole. She has a history of breast cancer and is mindful of maintaining a healthy weight to reduce risk factors. -Continue anastrozole as prescribed. -Regular follow-ups with oncologist (Dr. Pamelia Hoit) as directed.   ATTESTASTION STATEMENTS:  Reviewed by clinician on day of visit: allergies, medications, problem list, medical history, surgical history, family  history, social history, and previous encounter notes.   I have personally spent 40 minutes total time today in preparation, patient care, nutritional counseling and documentation for this visit, including the following: review of clinical lab tests; review of medical tests/procedures/services.      Jai Steil, PA-C

## 2023-07-05 ENCOUNTER — Ambulatory Visit (INDEPENDENT_AMBULATORY_CARE_PROVIDER_SITE_OTHER): Payer: Medicare Other | Admitting: Family Medicine

## 2023-07-05 ENCOUNTER — Encounter (INDEPENDENT_AMBULATORY_CARE_PROVIDER_SITE_OTHER): Payer: Self-pay | Admitting: Family Medicine

## 2023-07-05 VITALS — BP 142/80 | HR 62 | Temp 97.7°F | Ht 65.0 in | Wt 170.0 lb

## 2023-07-05 DIAGNOSIS — K5909 Other constipation: Secondary | ICD-10-CM | POA: Diagnosis not present

## 2023-07-05 DIAGNOSIS — Z6828 Body mass index (BMI) 28.0-28.9, adult: Secondary | ICD-10-CM

## 2023-07-05 DIAGNOSIS — E669 Obesity, unspecified: Secondary | ICD-10-CM

## 2023-07-05 DIAGNOSIS — E559 Vitamin D deficiency, unspecified: Secondary | ICD-10-CM | POA: Diagnosis not present

## 2023-07-05 DIAGNOSIS — E88819 Insulin resistance, unspecified: Secondary | ICD-10-CM | POA: Diagnosis not present

## 2023-07-05 MED ORDER — PSYLLIUM 0.36 G PO CAPS: ORAL_CAPSULE | ORAL | Status: DC

## 2023-07-05 NOTE — Progress Notes (Signed)
Carlye Grippe, D.O.  ABFM, ABOM Specializing in Clinical Bariatric Medicine  Office located at: [address] [place], [place]  [address]     Assessment and Plan:   Insulin resistance Assessment & Plan: Lab Results  Component Value Date   HGBA1C 5.6 05/15/2023   HGBA1C 5.6 03/19/2023   HGBA1C 5.7 01/02/2017   INSULIN 11.7 05/15/2023    No current meds. Diet/exercise approach. No complaints of hunger, however she sometimes has sweet cravings in the pm. Recommended pt to try yasso bars, keto peanut butter cups, a piece of dove dark chocolate, etc... when she has nightly sweet cravings. Also reminded patient that having protein with each meal is important for controlling cravings & increasing metabolism.    Vitamin D deficiency Assessment & Plan: Lab Results  Component Value Date   VD25OH 52.6 05/15/2023   VD25OH 42.22 03/19/2023   VD25OH 40.70 03/10/2022   Condition treated with OTC Vitamin D 5,000 units every day. C/w current supplementation regiment. Will recheck levels in 2 mos.    Other constipation Assessment & Plan: She expresses concerns about developing constipation symptoms in the future. Currently, symptoms have been well controlled. She has one daily normal bowl movement. Drinks 50-60 ounces of water daily. Pt does not wish to take Miralax. Is currently taking 3 psyllium husk capsules daily.   Adequate daily water intake encouraged along with activity/ movement  Drink at least half of your weight in ounces of water per day unless otherwise noted by one of your doctors that you must restrict water intake.  Eat plenty of fiber (goal is over 25 grams each day).  It is best to get most of your fiber from dietary sources which includes leafy green vegetables, fresh fruit, and whole grains. C/w psyllium capsules.   BMI 28.0-28.9,adult Current BMI 28.29 Generalized obesity- Starting BMI 30.26 Assessment & Plan: Since last office visit on 06/21/23 patient's  muscle mass has decreased by 0.2 lb. Fat mass has decreased by 1.8 lb. Total body water has decreased by 1.8 lb.  Counseling done on how various foods will affect these numbers and how to maximize success  Total lbs lost to date: 10 lbs  Total weight loss percentage to date: 5.56%   Meal plan: We slightly adjusted her journaling parameters to 1,100 - 1,200 calories and 80+ grams protein per pt request.  Behavioral Intervention Additional resources provided today: NA Evidence-based interventions for health behavior change were utilized today including the discussion of self monitoring techniques, problem-solving barriers and SMART goal setting techniques.   Regarding patient's less desirable eating habits and patterns, we employed the technique of small changes.  Pt will specifically work on: walking 20 minutes every other day for next visit.    FOLLOW UP: Return 07/19/23. She was informed of the importance of frequent follow up visits to maximize her success with intensive lifestyle modifications for her multiple health conditions.  Subjective:   Chief complaint: Obesity Nani is here to discuss her progress with her obesity treatment plan. She is on the Category 1 Plan and keeping a food journal and adhering to recommended goals of 1000-1100 calories and 75+ protein and states she is following her eating plan approximately 80% of the time. She states she is not exercising due to an hip injury.  Interval History:  Zaliya Peeples is here for a follow up office visit. Since last OV,  Jessia has been doing well. She is not exercising due to a hip injury. Has  been recovering well with red light therapy and cold packs. She feels that her  current journaling parameters are slightly low - specifically in the calories - & would like to discuss this today. Drinks 50-60 ounces of water daily.   Review of Systems:  Pertinent positives were addressed with patient today.  Reviewed by clinician on day  of visit: allergies, medications, problem list, medical history, surgical history, family history, social history, and previous encounter notes.  Weight Summary and Biometrics   Weight Lost Since Last Visit: 2lb  Weight Gained Since Last Visit: 0lb   Vitals Temp: 97.7 F (36.5 C) BP: (!) 142/80 Pulse Rate: 62 SpO2: 100 %   Anthropometric Measurements Height: 5\' 5"  (1.651 m) Weight: 170 lb (77.1 kg) BMI (Calculated): 28.29 Weight at Last Visit: 172lb Weight Lost Since Last Visit: 2lb Weight Gained Since Last Visit: 0lb Starting Weight: 180 lb Total Weight Loss (lbs): 10 lb (4.536 kg) Peak Weight: 180 lb   Body Composition  Body Fat %: 34.5 % Fat Mass (lbs): 59 lbs Muscle Mass (lbs): 106.2 lbs Total Body Water (lbs): 69 lbs Visceral Fat Rating : 9   Other Clinical Data Fasting: no Labs: no Today's Visit #: 4 Starting Date: 05/15/23   Objective:   PHYSICAL EXAM: Blood pressure (!) 142/80, pulse 62, temperature 97.7 F (36.5 C), height 5\' 5"  (1.651 m), weight 170 lb (77.1 kg), last menstrual period 02/17/2012, SpO2 100%. Body mass index is 28.29 kg/m.  General: Well Developed, well nourished, and in no acute distress.  HEENT: Normocephalic, atraumatic Skin: Warm and dry, cap RF less 2 sec, good turgor Chest:  Normal excursion, shape, no gross abn Respiratory: speaking in full sentences, no conversational dyspnea NeuroM-Sk: Ambulates w/o assistance, moves * 4 Psych: A and O *3, insight good, mood-full  DIAGNOSTIC DATA REVIEWED:  BMET    Component Value Date/Time   NA 139 05/15/2023 1058   K 4.4 05/15/2023 1058   CL 101 05/15/2023 1058   CO2 23 05/15/2023 1058   GLUCOSE 87 05/15/2023 1058   GLUCOSE 94 03/19/2023 1153   BUN 17 05/15/2023 1058   CREATININE 0.87 05/15/2023 1058   CREATININE 0.88 12/15/2020 0833   CALCIUM 9.6 05/15/2023 1058   GFRNONAA >60 12/15/2020 0833   GFRAA >60 05/09/2018 2219   Lab Results  Component Value Date   HGBA1C 5.6  05/15/2023   HGBA1C 5.8 09/15/2014   Lab Results  Component Value Date   INSULIN 11.7 05/15/2023   Lab Results  Component Value Date   TSH 1.240 05/15/2023   CBC    Component Value Date/Time   WBC 6.9 05/15/2023 1058   WBC 6.2 03/10/2022 0821   RBC 4.95 05/15/2023 1058   RBC 4.62 03/10/2022 0821   HGB 14.5 05/15/2023 1058   HCT 43.4 05/15/2023 1058   PLT 237 05/15/2023 1058   MCV 88 05/15/2023 1058   MCH 29.3 05/15/2023 1058   MCH 28.4 12/15/2020 0833   MCHC 33.4 05/15/2023 1058   MCHC 33.3 03/10/2022 0821   RDW 13.0 05/15/2023 1058   Iron Studies No results found for: "IRON", "TIBC", "FERRITIN", "IRONPCTSAT" Lipid Panel     Component Value Date/Time   CHOL 203 (H) 05/15/2023 1058   TRIG 145 05/15/2023 1058   HDL 56 05/15/2023 1058   CHOLHDL 6 03/19/2023 1153   VLDL 35.0 03/19/2023 1153   LDLCALC 121 (H) 05/15/2023 1058   Hepatic Function Panel     Component Value Date/Time   PROT 7.3  05/15/2023 1058   ALBUMIN 4.6 05/15/2023 1058   AST 22 05/15/2023 1058   AST 26 12/15/2020 0833   ALT 21 05/15/2023 1058   ALT 36 12/15/2020 0833   ALKPHOS 145 (H) 05/15/2023 1058   BILITOT 0.4 05/15/2023 1058   BILITOT 0.4 12/15/2020 0833      Component Value Date/Time   TSH 1.240 05/15/2023 1058   Nutritional Lab Results  Component Value Date   VD25OH 52.6 05/15/2023   VD25OH 42.22 03/19/2023   VD25OH 40.70 03/10/2022    Attestations:   I, Special Puri, acting as a Stage manager for [employer], DO., have compiled all relevant documentation for today's office visit on behalf of Thomasene Lot, DO, while in the presence of [employer], DO.  I have reviewed the above documentation for accuracy and completeness, and I agree with the above. Carlye Grippe, D.O.  The 21st Century Cures Act was signed into law in 2016 which includes the topic of electronic health records.  This provides immediate access to information in MyChart.  This includes consultation  notes, operative notes, office notes, lab results and pathology reports.  If you have any questions about what you read please let us know at your next visit so we can discuss your concerns and take corrective action if need be.  We are right here with you.

## 2023-07-19 ENCOUNTER — Encounter (INDEPENDENT_AMBULATORY_CARE_PROVIDER_SITE_OTHER): Payer: Self-pay | Admitting: Family Medicine

## 2023-07-19 ENCOUNTER — Ambulatory Visit (INDEPENDENT_AMBULATORY_CARE_PROVIDER_SITE_OTHER): Payer: Medicare Other | Admitting: Family Medicine

## 2023-07-19 VITALS — BP 136/83 | HR 70 | Temp 98.2°F | Ht 65.0 in | Wt 170.0 lb

## 2023-07-19 DIAGNOSIS — E88819 Insulin resistance, unspecified: Secondary | ICD-10-CM | POA: Diagnosis not present

## 2023-07-19 DIAGNOSIS — E782 Mixed hyperlipidemia: Secondary | ICD-10-CM

## 2023-07-19 DIAGNOSIS — E669 Obesity, unspecified: Secondary | ICD-10-CM | POA: Diagnosis not present

## 2023-07-19 DIAGNOSIS — Z6828 Body mass index (BMI) 28.0-28.9, adult: Secondary | ICD-10-CM

## 2023-07-19 DIAGNOSIS — E559 Vitamin D deficiency, unspecified: Secondary | ICD-10-CM | POA: Diagnosis not present

## 2023-07-19 NOTE — Progress Notes (Signed)
Lisa Gallegos, D.O.  ABFM, ABOM Specializing in Clinical Bariatric Medicine  Office located at: [address] [place], [place]  [address]     Assessment and Plan:   Insulin resistance Assessment & Plan: Last Hemoglobin A1c & Fasting Insulin was 5.6 & 11.7 respectively on 05/15/23. Pt endorses that her hunger and cravings are pretty well controlled when adhering to her journaling parameters. Once in a while has sweet cravings. Recommended pt to try either clio bars or dove promises to abate her occasional cravings. Continue her weight loss therapy.     Mixed hyperlipidemia Assessment & Plan: Condition treated with Crestor 10 mg daily. Last Lipid panel obtained on 05/15/23. LDL was elevated at 121. Reminded pt to focus on foods that are low in saturated and trans fats, and low in fatty carbs to improve this. Continue with  statin therapy per PCP & our treatment plan of a heart-heathy, low cholesterol meal plan.    Vitamin D deficiency Assessment & Plan: Last vitamin D levels were 52.6 on 05/15/23. Currently on OTC vitamin D 5,000 units daily. Continue with current supplementation regiment. Will recheck levels in 1-2 mos.    BMI 28.0-28.9,adult Current BMI 28.29 Generalized obesity- Starting BMI 30.26 Assessment & Plan: Since last office visit on 07/05/23 patient's  Muscle mass has not changed. Fat mass has decreased by 0.4 lb. Total body water has decreased by 1 lb.  Counseling done on how various foods will affect these numbers and how to maximize success  Total lbs lost to date: 10 lbs  Total weight loss percentage to date: 5.56%   No change to meal plan - see Subjective  I reviewed the ideal Healthy Eating Plate with the pt: 1/2 plate of lean protein and other half of plate is complex carbs, fruits,& veggies.   Reviewed different sources of complex carbs pt can incorporate into her diet plan.   Recommended pt to try Edamame/ Black Spaghetti spaghetti.   Behavioral  Intervention Additional resources provided today:  Healthy eating principles handout & food journaling log handout  Evidence-based interventions for health behavior change were utilized today including the discussion of self monitoring techniques, problem-solving barriers and SMART goal setting techniques.   Regarding patient's less desirable eating habits and patterns, we employed the technique of small changes.  Pt will specifically work on: bring journaling log & walking 20 minutes every other day for next visit.    FOLLOW UP: Return 08/14/23. She was informed of the importance of frequent follow up visits to maximize her success with intensive lifestyle modifications for her multiple health conditions.  Subjective:   Chief complaint: Obesity Lisa Gallegos is here to discuss her progress with her obesity treatment plan. She is the Category 1 Plan and keeping a food journal and adhering to recommended goals of 1100-1200  calories and 80+ protein and states she is following her eating plan approximately 80% of the time. She states she is went to the gym 3 times in the last few weeks.   Interval History:  Lisa Gallegos is here for a follow up office visit. Since last OV, Lisa Gallegos endorses not journaling her intake, measuring her portions, or exercising because she has been occupied with taking care of her sick grandchildren.   She is ready to get back on track. Hunger and cravings are pretty well controlled, once in a while she has sweet cravings. No c/o constipation, symptoms stable with 3 psyllium husk capsules daily & adequate hydration.  Review of  Systems:  Pertinent positives were addressed with patient today.  Reviewed by clinician on day of visit: allergies, medications, problem list, medical history, surgical history, family history, social history, and previous encounter notes.  Weight Summary and Biometrics   Weight Lost Since Last Visit: 0lb  Weight Gained Since Last Visit: 0lb    Vitals Temp: 98.2 F (36.8 C) BP: 136/83 Pulse Rate: 70 SpO2: 99 %   Anthropometric Measurements Height: 5\' 5"  (1.651 m) Weight: 170 lb (77.1 kg) BMI (Calculated): 28.29 Weight at Last Visit: 170lb Weight Lost Since Last Visit: 0lb Weight Gained Since Last Visit: 0lb Starting Weight: 180lb Total Weight Loss (lbs): 10 lb (4.536 kg) Peak Weight: 180lb   Body Composition  Body Fat %: 34.4 % Fat Mass (lbs): 58.6 lbs Muscle Mass (lbs): 106.2 lbs Total Body Water (lbs): 68 lbs Visceral Fat Rating : 9   Other Clinical Data Fasting: no Labs: no Today's Visit #: 5 Starting Date: 05/15/23   Objective:   PHYSICAL EXAM: Blood pressure 136/83, pulse 70, temperature 98.2 F (36.8 C), height 5\' 5"  (1.651 m), weight 170 lb (77.1 kg), last menstrual period 02/17/2012, SpO2 99%. Body mass index is 28.29 kg/m.  General: Well Developed, well nourished, and in no acute distress.  HEENT: Normocephalic, atraumatic Skin: Warm and dry, cap RF less 2 sec, good turgor Chest:  Normal excursion, shape, no gross abn Respiratory: speaking in full sentences, no conversational dyspnea NeuroM-Sk: Ambulates w/o assistance, moves * 4 Psych: A and O *3, insight good, mood-full  DIAGNOSTIC DATA REVIEWED:  BMET    Component Value Date/Time   NA 139 05/15/2023 1058   K 4.4 05/15/2023 1058   CL 101 05/15/2023 1058   CO2 23 05/15/2023 1058   GLUCOSE 87 05/15/2023 1058   GLUCOSE 94 03/19/2023 1153   BUN 17 05/15/2023 1058   CREATININE 0.87 05/15/2023 1058   CREATININE 0.88 12/15/2020 0833   CALCIUM 9.6 05/15/2023 1058   GFRNONAA >60 12/15/2020 0833   GFRAA >60 05/09/2018 2219   Lab Results  Component Value Date   HGBA1C 5.6 05/15/2023   HGBA1C 5.8 09/15/2014   Lab Results  Component Value Date   INSULIN 11.7 05/15/2023   Lab Results  Component Value Date   TSH 1.240 05/15/2023   CBC    Component Value Date/Time   WBC 6.9 05/15/2023 1058   WBC 6.2 03/10/2022 0821   RBC  4.95 05/15/2023 1058   RBC 4.62 03/10/2022 0821   HGB 14.5 05/15/2023 1058   HCT 43.4 05/15/2023 1058   PLT 237 05/15/2023 1058   MCV 88 05/15/2023 1058   MCH 29.3 05/15/2023 1058   MCH 28.4 12/15/2020 0833   MCHC 33.4 05/15/2023 1058   MCHC 33.3 03/10/2022 0821   RDW 13.0 05/15/2023 1058   Iron Studies No results found for: "IRON", "TIBC", "FERRITIN", "IRONPCTSAT" Lipid Panel     Component Value Date/Time   CHOL 203 (H) 05/15/2023 1058   TRIG 145 05/15/2023 1058   HDL 56 05/15/2023 1058   CHOLHDL 6 03/19/2023 1153   VLDL 35.0 03/19/2023 1153   LDLCALC 121 (H) 05/15/2023 1058   Hepatic Function Panel     Component Value Date/Time   PROT 7.3 05/15/2023 1058   ALBUMIN 4.6 05/15/2023 1058   AST 22 05/15/2023 1058   AST 26 12/15/2020 0833   ALT 21 05/15/2023 1058   ALT 36 12/15/2020 0833   ALKPHOS 145 (H) 05/15/2023 1058   BILITOT 0.4 05/15/2023 1058   BILITOT  0.4 12/15/2020 0833      Component Value Date/Time   TSH 1.240 05/15/2023 1058   Nutritional Lab Results  Component Value Date   VD25OH 52.6 05/15/2023   VD25OH 42.22 03/19/2023   VD25OH 40.70 03/10/2022    Attestations:   I, Special Puri, acting as a Stage manager for Thomasene Lot, DO., have compiled all relevant documentation for today's office visit on behalf of Thomasene Lot, DO, while in the presence of [employer], DO.  I have reviewed the above documentation for accuracy and completeness, and I agree with the above. Lisa Gallegos, D.O.  The 21st Century Cures Act was signed into law in 2016 which includes the topic of electronic health records.  This provides immediate access to information in MyChart.  This includes consultation notes, operative notes, office notes, lab results and pathology reports.  If you have any questions about what you read please let us know at your next visit so we can discuss your concerns and take corrective action if need be.  We are right here with you.

## 2023-07-21 ENCOUNTER — Telehealth: Payer: Self-pay | Admitting: Hematology and Oncology

## 2023-07-21 NOTE — Telephone Encounter (Signed)
Rescheduled appointment per provider PAL. Left VM with the changes to the upcoming appointment.

## 2023-07-30 ENCOUNTER — Ambulatory Visit: Payer: Medicare Other | Admitting: Hematology and Oncology

## 2023-08-02 ENCOUNTER — Ambulatory Visit (INDEPENDENT_AMBULATORY_CARE_PROVIDER_SITE_OTHER): Payer: Medicare Other | Admitting: Family Medicine

## 2023-08-14 ENCOUNTER — Ambulatory Visit (INDEPENDENT_AMBULATORY_CARE_PROVIDER_SITE_OTHER): Payer: Medicare Other | Admitting: Family Medicine

## 2023-08-23 ENCOUNTER — Other Ambulatory Visit: Payer: Self-pay | Admitting: Hematology and Oncology

## 2023-08-28 ENCOUNTER — Inpatient Hospital Stay: Payer: Medicare Other | Attending: Hematology and Oncology | Admitting: Hematology and Oncology

## 2023-08-28 VITALS — BP 148/65 | HR 73 | Temp 97.8°F | Resp 18 | Ht 65.0 in | Wt 174.4 lb

## 2023-08-28 DIAGNOSIS — Z170 Estrogen receptor positive status [ER+]: Secondary | ICD-10-CM | POA: Insufficient documentation

## 2023-08-28 DIAGNOSIS — C50412 Malignant neoplasm of upper-outer quadrant of left female breast: Secondary | ICD-10-CM | POA: Insufficient documentation

## 2023-08-28 DIAGNOSIS — Z923 Personal history of irradiation: Secondary | ICD-10-CM | POA: Diagnosis not present

## 2023-08-28 DIAGNOSIS — Z79811 Long term (current) use of aromatase inhibitors: Secondary | ICD-10-CM | POA: Insufficient documentation

## 2023-08-28 DIAGNOSIS — Z79899 Other long term (current) drug therapy: Secondary | ICD-10-CM | POA: Diagnosis not present

## 2023-08-28 MED ORDER — ESTRADIOL 0.1 MG/GM VA CREA: 1.0000 | TOPICAL_CREAM | VAGINAL | 12 refills | Status: DC

## 2023-08-28 NOTE — Progress Notes (Signed)
Patient Care Team: Mort Sawyers, FNP as PCP - General (Family Medicine) Dorothy Puffer, MD as Consulting Physician (Radiation Oncology) Emelia Loron, MD as Consulting Physician (General Surgery) Serena Croissant, MD as Consulting Physician (Hematology and Oncology) Janalyn Harder, MD (Inactive) as Consulting Physician (Dermatology) O'Neal, Ronnald Ramp, MD as Consulting Physician (Cardiology)  DIAGNOSIS:  Encounter Diagnosis  Name Primary?   Malignant neoplasm of upper-outer quadrant of left breast in female, estrogen receptor positive (HCC) Yes    SUMMARY OF ONCOLOGIC HISTORY: Oncology History  Malignant neoplasm of upper-outer quadrant of left breast in female, estrogen receptor positive (HCC)  12/03/2020 Initial Diagnosis   Screening mammogram showed a left breast mass. Diagnostic mammogram and US showed a 0.5cm mass at the 12 o'clock position. Biopsy showed IDC, grade 2, HER-2 equivocal by IHC, negative by FISH (ratio 1.26), ER+ >95%, PR+ 40%, Ki67 5%.   12/15/2020 Cancer Staging   Staging form: Breast, AJCC 8th Edition - Clinical stage from 12/15/2020: Stage IA (cT1a, cN0, cM0, G2, ER+, PR+, HER2-) - Signed by Serena Croissant, MD on 12/15/2020 Stage prefix: Initial diagnosis Laterality: Left Staged by: Pathologist and managing physician Stage used in treatment planning: Yes National guidelines used in treatment planning: Yes Type of national guideline used in treatment planning: NCCN   12/23/2020 Surgery   Left lumpectomy Dwain Sarna): IDC, grade 1, 0.6cm, clear margins, 3 left axillary lymph nodes negative for carcinoma.   01/24/2021 - 02/18/2021 Radiation Therapy   Adjuvant radiation  01/24/2021 through 02/18/2021 Site Technique Total Dose (Gy) Dose per Fx (Gy) Completed Fx Beam Energies  Breast, Left: Breast_Lt 3D 42.56/42.56 2.66 16/16 6X  Breast, Left: Breast_Lt_Bst 3D 8/8 2 4/4 6X     03/2021 -  Anti-estrogen oral therapy   Anastrozole daily     CHIEF COMPLIANT:  Follow-up on anastrozole therapy  HISTORY OF PRESENT ILLNESS:   History of Present Illness   The patient, with a history of breast cancer, has been on anastrozole for two and a half years. She reports morning stiffness in her hands and knees, which improves with movement. She sleeps well and has not experienced severe side effects from the medication. However, she has noticed changes in her skin, describing it as dry and "crepey." She has been using good skincare products and has considered adding a protein collagen supplement to her routine. She also reports vaginal dryness, which she manages with Replens a few times a week. She has a trip to [place] planned in the near future.         ALLERGIES:  is allergic to atorvastatin.  MEDICATIONS:  Current Outpatient Medications  Medication Sig Dispense Refill   [START ON 08/30/2023] estradiol (ESTRACE VAGINAL) 0.1 MG/GM vaginal cream Place 1 Applicatorful vaginally 2 (two) times a week. 42.5 g 12   anastrozole (ARIMIDEX) 1 MG tablet TAKE 1 TABLET BY MOUTH EVERY DAY 90 tablet 3   Cholecalciferol (VITAMIN D3) 125 MCG (5000 UT) TABS Take 5,000 Units by mouth daily in the afternoon.     Coenzyme Q10 (CO Q 10 PO) Take 200 mg by mouth daily in the afternoon.     Cyanocobalamin (VITAMIN B 12 PO) Take 500 mcg by mouth daily in the afternoon.     Magnesium 400 MG TABS Take 400 mg by mouth daily in the afternoon.     Omega-3 1000 MG CAPS Take by mouth. Algae omega     OVER THE COUNTER MEDICATION Take 150 mg by mouth. Annatto-E     polyethylene glycol (MIRALAX /  GLYCOLAX) 17 g packet Take 17 g by mouth 2 (two) times daily. Until stooling regularly 60 packet 0   Psyllium 0.36 g CAPS 2-3 tabs daily     RA TURMERIC EXTRA STRENGTH PO Take 500 mg by mouth daily.     rosuvastatin (CRESTOR) 10 MG tablet Take 1 tablet (10 mg total) by mouth daily. 90 tablet 3   No current facility-administered medications for this visit.    PHYSICAL EXAMINATION: ECOG  PERFORMANCE STATUS: 1 - Symptomatic but completely ambulatory  Vitals:   08/28/23 0906  BP: (!) 148/65  Pulse: 73  Resp: 18  Temp: 97.8 F (36.6 C)  SpO2: 99%   Filed Weights   08/28/23 0906  Weight: 174 lb 6.4 oz (79.1 kg)      LABORATORY DATA:  I have reviewed the data as listed    Latest Ref Rng & Units 05/15/2023   10:58 AM 03/19/2023   11:53 AM 03/10/2022    8:21 AM  CMP  Glucose 70 - 99 mg/dL 87  94  97   BUN 8 - 27 mg/dL 17  17  24    Creatinine 0.57 - 1.00 mg/dL 4.49  6.75  9.16   Sodium 134 - 144 mmol/L 139  141  140   Potassium 3.5 - 5.2 mmol/L 4.4  4.6  4.6   Chloride 96 - 106 mmol/L 101  102  104   CO2 20 - 29 mmol/L 23  30  31    Calcium 8.7 - 10.3 mg/dL 9.6  9.6  9.7   Total Protein 6.0 - 8.5 g/dL 7.3  7.5  7.1   Total Bilirubin 0.0 - 1.2 mg/dL 0.4  0.4  0.5   Alkaline Phos 44 - 121 IU/L 145  119  125   AST 0 - 40 IU/L 22  21  19    ALT 0 - 32 IU/L 21  23  16      Lab Results  Component Value Date   WBC 6.9 05/15/2023   HGB 14.5 05/15/2023   HCT 43.4 05/15/2023   MCV 88 05/15/2023   PLT 237 05/15/2023   NEUTROABS 3.6 05/15/2023    ASSESSMENT & PLAN:  Malignant neoplasm of upper-outer quadrant of left breast in female, 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/23/2020: Left lumpectomy Dwain Sarna): IDC, grade 1, 0.6cm, clear margins, 3 left axillary lymph nodes negative for carcinoma.HER-2 equivocal by IHC, negative by FISH (ratio 1.26), ER+ >95%, PR+ 40%, Ki67 5%   Recommendation: 1. Adjuvant radiation therapy 01/25/21- 02/18/21 2. Adjuvant antiestrogen therapy with anastrozole started 03/16/2021   Anastrozole toxicities: Joint stiffness especially in the hands in the morning.  Gets better through the day.      Breast cancer surveillance: 1.  Breast exam 08/28/2023: Benign 2. mammogram 11/22/2022: Benign breast density category B   Bone density 04/02/2023: T-score -0.8: Normal Return to clinic in 1 year for  follow-up ------------------------------------- Assessment and Plan    Breast Cancer Survivorship Tolerating Anastrozole well with minor joint stiffness in the morning that improves with movement. No significant complaints. -Continue Anastrozole as prescribed.  Skin Changes Reports dry and crepey skin, possibly related to Anastrozole. Interested in collagen supplementation and continues to use Retin A. -Approved use of collagen supplementation and continuation of Retin A.  Vaginal Dryness Using Replens a few times a week with some relief. Discussed the option of vaginal estrogen creams. -Prescribe vaginal estrogen cream as it does not increase breast cancer risk.  General Health Maintenance -Next follow-up appointment in one  year.     She is taking her family to [place] and some Christmas markets later this year.  No orders of the defined types were placed in this encounter.  The patient has a good understanding of the overall plan. she agrees with it. she will call with any problems that may develop before the next visit here. Total time spent: 30 mins including face to face time and time spent for planning, charting and co-ordination of care   Tamsen Meek, MD 08/28/23

## 2023-08-28 NOTE — Assessment & Plan Note (Signed)
12/23/2020: Left lumpectomy Dwain Sarna): IDC, grade 1, 0.6cm, clear margins, 3 left axillary lymph nodes negative for carcinoma.HER-2 equivocal by IHC, negative by FISH (ratio 1.26), ER+ >95%, PR+ 40%, Ki67 5%   Recommendation: 1. Adjuvant radiation therapy 01/25/21- 02/18/21 2. Adjuvant antiestrogen therapy with anastrozole started 03/16/2021   Anastrozole toxicities: Joint stiffness especially in the hands in the morning.  Gets better through the day.      Breast cancer surveillance: 1.  Breast exam 08/28/2023: Benign 2. mammogram 11/22/2022: Benign breast density category B   Bone density 04/02/2023: T-score -0.8: Normal Return to clinic in 1 year for follow-up

## 2023-10-29 ENCOUNTER — Ambulatory Visit: Payer: Medicare Other | Attending: General Surgery

## 2023-10-29 VITALS — Wt 176.4 lb

## 2023-10-29 DIAGNOSIS — Z483 Aftercare following surgery for neoplasm: Secondary | ICD-10-CM | POA: Insufficient documentation

## 2023-10-29 NOTE — Therapy (Signed)
 OUTPATIENT PHYSICAL THERAPY SOZO SCREENING NOTE   Patient Name: Lisa Gallegos MRN: [id number] DOB:1956/04/10, 68 y.o., female Today's Date: 10/29/2023  PCP: Corwin Antu, FNP REFERRING PROVIDER: Ebbie Cough, MD   PT End of Session - 10/29/23 0902     Visit Number 4   # unchanged due to screen only   PT Start Time 0900    PT Stop Time 0906    PT Time Calculation (min) 6 min    Activity Tolerance Patient tolerated treatment well    Behavior During Therapy [facility] for tasks assessed/performed             Past Medical History:  Diagnosis Date   Anemia    Anxiety    Cancer (HCC) 11/2020   left breast IDC   Family history of pancreatic cancer 12/16/2020   Hyperlipidemia    Joint pain    Malignant neoplasm of upper-outer quadrant of left breast in female, 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/03/2020   Personal history of radiation therapy    Vitamin D  deficiency    Past Surgical History:  Procedure Laterality Date   BREAST LUMPECTOMY WITH RADIOACTIVE SEED AND AXILLARY LYMPH NODE DISSECTION Left 12/23/2020   Procedure: LEFT BREAST LUMPECTOMY WITH RADIOACTIVE SEED AND LEFT AXILLARY LYMPH NODE BIOPSY;  Surgeon: Ebbie Cough, MD;  Location: [facility];  Service: General;  Laterality: Left;  PEC BLOCK; START TIME OF 11:30 AM FOR 60 MINUTES ROOM 2 [place] IQ   BREAST SURGERY     CESAREAN SECTION  1985   FTP   COLONOSCOPY WITH PROPOFOL  N/A 12/28/2022   Procedure: COLONOSCOPY WITH PROPOFOL ;  Surgeon: Unk Corinn Skiff, MD;  Location: [facility];  Service: Gastroenterology;  Laterality: N/A;   KIDNEY STONE SURGERY     laser surgery   LAPAROSCOPIC APPENDECTOMY N/A 05/10/2018   Procedure: APPENDECTOMY LAPAROSCOPIC;  Surgeon: Mikell Katz, MD;  Location: [facility];  Service: General;  Laterality: N/A;   Patient Active Problem List   Diagnosis Date Noted   Insulin  resistance - new onset 06/21/2023   Generalized obesity- Starting BMI 30.26 06/21/2023    BMI 28.0-28.9,adult Current BMI 28.8 06/21/2023   History of colon polyps 03/19/2023   Elevated LDL cholesterol level 03/19/2023   Arthralgia of both hands 03/07/2022   History of breast cancer 03/06/2022   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 Patient underwent a left lumpectomy and sentinel node biopsy (3 negative nodes) on 12/23/2020. It is ER/PR positive and HER2 negative with a Ki67 of 5%.   PRECAUTIONS: left UE Lymphedema risk, None  SUBJECTIVE: Pt returns for her 6 month L-Dex screen.   PAIN:  Are you having pain? No  SOZO SCREENING: Patient was assessed today using the SOZO machine to determine the lymphedema index score. This was compared to her baseline score. It was determined that she is within the recommended range when compared to her baseline and no further action is needed at this time. She will continue SOZO screenings. These are done every 3 months  for 2 years post operatively followed by every 6 months for 2 years, and then annually.   L-DEX FLOWSHEETS - 10/29/23 0900       L-DEX LYMPHEDEMA SCREENING   Measurement Type Unilateral    L-DEX MEASUREMENT EXTREMITY Upper Extremity    POSITION  Standing    DOMINANT SIDE Right    At Risk Side Left    BASELINE SCORE (UNILATERAL) 2.2    L-DEX SCORE (UNILATERAL) 3.5    VALUE CHANGE (UNILAT) 1.3              Aden Berwyn Caldron, PTA 10/29/2023, 9:07 AM

## 2023-11-06 ENCOUNTER — Other Ambulatory Visit: Payer: Self-pay | Admitting: Adult Health

## 2023-11-06 DIAGNOSIS — Z9889 Other specified postprocedural states: Secondary | ICD-10-CM

## 2023-11-22 ENCOUNTER — Ambulatory Visit: Payer: Medicare Other

## 2023-11-22 VITALS — BP 126/70 | Ht 65.0 in | Wt 177.6 lb

## 2023-11-22 DIAGNOSIS — Z0000 Encounter for general adult medical examination without abnormal findings: Secondary | ICD-10-CM

## 2023-11-22 NOTE — Progress Notes (Signed)
 Subjective:   Lisa Gallegos is a 68 y.o. female who presents for an Initial Medicare Annual Wellness Visit.  Visit Complete: Virtual I connected with  Lisa Gallegos on 11/22/23 by a audio enabled telemedicine application and verified that I am speaking with the correct person using two identifiers.  Patient Location: Home  Provider Location: Office/Clinic  I discussed the limitations of evaluation and management by telemedicine. The patient expressed understanding and agreed to proceed.  Vital Signs: Because this visit was a virtual/telehealth visit, some criteria may be missing or patient reported. Any vitals not documented were not able to be obtained and vitals that have been documented are patient reported.  Patient Medicare AWV questionnaire was completed by the patient on 11/18/2023; I have confirmed that all information answered by patient is correct and no changes since this date.  Cardiac Risk Factors include: advanced age (>26men, >80 women);dyslipidemia    Objective:    Today's Vitals   11/22/23 0855  Weight: 177 lb 9.6 oz (80.6 kg)  Height: 5' 5 (1.651 m)   Body mass index is 29.55 kg/m.     11/22/2023    9:09 AM 01/13/2021   12:59 PM 12/23/2020   10:09 AM 12/17/2020    3:12 PM 12/15/2020    2:32 PM 11/29/2018    9:54 AM 05/10/2018    5:00 AM  Advanced Directives  Does Patient Have a Medical Advance Directive? No No No No No No No  Would patient like information on creating a medical advance directive?  No - Patient declined No - Patient declined No - Patient declined No - Patient declined No - Patient declined No - Patient declined    Current Medications (verified) Outpatient Encounter Medications as of 11/22/2023  Medication Sig   anastrozole  (ARIMIDEX ) 1 MG tablet TAKE 1 TABLET BY MOUTH EVERY DAY   Cholecalciferol (VITAMIN D3) 125 MCG (5000 UT) TABS Take 5,000 Units by mouth daily in the afternoon.   Coenzyme Q10 (CO Q 10 PO) Take 200 mg by mouth daily in the  afternoon.   Cyanocobalamin  (VITAMIN B 12 PO) Take 500 mcg by mouth daily in the afternoon.   estradiol  (ESTRACE  VAGINAL) 0.1 MG/GM vaginal cream Place 1 Applicatorful vaginally 2 (two) times a week.   Magnesium 400 MG TABS Take 400 mg by mouth daily in the afternoon.   Omega-3 1000 MG CAPS Take by mouth. Algae omega   OVER THE COUNTER MEDICATION Take 150 mg by mouth. Annatto-E   polyethylene glycol (MIRALAX  / GLYCOLAX ) 17 g packet Take 17 g by mouth 2 (two) times daily. Until stooling regularly   Psyllium 0.36 g CAPS 2-3 tabs daily   RA TURMERIC EXTRA STRENGTH PO Take 500 mg by mouth daily.   rosuvastatin  (CRESTOR ) 10 MG tablet Take 1 tablet (10 mg total) by mouth daily.   No facility-administered encounter medications on file as of 11/22/2023.    Allergies (verified) Atorvastatin    History: Past Medical History:  Diagnosis Date   Anemia    Anxiety    Cancer (HCC) 11/2020   left breast IDC   Family history of pancreatic cancer 12/16/2020   Hyperlipidemia    Joint pain    Malignant neoplasm of upper-outer quadrant of left breast in female, 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/03/2020   Personal history of radiation therapy    Vitamin D  deficiency    Past Surgical History:  Procedure Laterality Date   BREAST LUMPECTOMY WITH RADIOACTIVE SEED AND AXILLARY LYMPH NODE DISSECTION Left 12/23/2020  Procedure: LEFT BREAST LUMPECTOMY WITH RADIOACTIVE SEED AND LEFT AXILLARY LYMPH NODE BIOPSY;  Surgeon: Ebbie Cough, MD;  Location: [facility];  Service: General;  Laterality: Left;  PEC BLOCK; START TIME OF 11:30 AM FOR 60 MINUTES ROOM 2 [place] [facility]   BREAST SURGERY     CESAREAN SECTION  1985   FTP   COLONOSCOPY WITH PROPOFOL  N/A 12/28/2022   Procedure: COLONOSCOPY WITH PROPOFOL ;  Surgeon: Unk Corinn Skiff, MD;  Location: [facility];  Service: Gastroenterology;  Laterality: N/A;   KIDNEY STONE SURGERY     laser surgery   LAPAROSCOPIC APPENDECTOMY N/A 05/10/2018    Procedure: APPENDECTOMY LAPAROSCOPIC;  Surgeon: Mikell Katz, MD;  Location: [facility];  Service: General;  Laterality: N/A;   Family History  Problem Relation Age of Onset   Pancreatic cancer Mother 64   Heart disease Father    Hypertension Father    Hyperlipidemia Father    Healthy Sister    Healthy Brother    Other Son        meningioma   Lung cancer Maternal Aunt        dx after 50, smoking hx   Breast cancer Neg Hx    Social History   Socioeconomic History   Marital status: Significant Other    Spouse name: Not on file   Number of children: 2   Years of education: Not on file   Highest education level: Not on file  Occupational History   Occupation: retired  Tobacco Use   Smoking status: Never   Smokeless tobacco: Never  Vaping Use   Vaping status: Never Used  Substance and Sexual Activity   Alcohol use: Not Currently    Alcohol/week: 0.0 standard drinks of alcohol   Drug use: No   Sexual activity: Yes    Partners: Male    Birth control/protection: Post-menopausal  Other Topics Concern   Not on file  Social History Narrative   Lives in [place] with fiance. 2 dogs       Work - Oncologist.      Diet - chicken and fish with veggies, avoids red meat. Vegetarian to bring cholesterol down as doesn't want to be treated with cholesterol medications if she can avoid it.       Social Drivers of Corporate Investment Banker Strain: Low Risk  (11/22/2023)   Overall Financial Resource Strain (CARDIA)    Difficulty of Paying Living Expenses: Not hard at all  Food Insecurity: No Food Insecurity (11/22/2023)   Hunger Vital Sign    Worried About Running Out of Food in the Last Year: Never true    Ran Out of Food in the Last Year: Never true  Transportation Needs: No Transportation Needs (11/22/2023)   PRAPARE - Administrator, Civil Service (Medical): No    Lack of Transportation (Non-Medical): No  Physical Activity: Insufficiently  Active (11/22/2023)   Exercise Vital Sign    Days of Exercise per Week: 2 days    Minutes of Exercise per Session: 60 min  Stress: No Stress Concern Present (11/22/2023)   [employer] of Occupational Health - Occupational Stress Questionnaire    Feeling of Stress : Not at all  Social Connections: Moderately Isolated (11/22/2023)   Social Connection and Isolation Panel [NHANES]    Frequency of Communication with Friends and Family: More than three times a week    Frequency of Social Gatherings with Friends and Family: More than three times a week  Attends Religious Services: Never    Active Member of Clubs or Organizations: No    Attends Banker Meetings: Never    Marital Status: Married    Tobacco Counseling Counseling given: Not Answered   Clinical Intake:  Pre-visit preparation completed: Yes  Pain : No/denies pain    BMI - recorded: 29.55 Nutritional Status: BMI 25 -29 Overweight Nutritional Risks: None Diabetes: No  How often do you need to have someone help you when you read instructions, pamphlets, or other written materials from your doctor or pharmacy?: 1 - Never  Interpreter Needed?: No  Comments: lives with partner Information entered by :: B.Aven Cegielski,LPN   Activities of Daily Living    11/20/2023   11:12 AM  In your present state of health, do you have any difficulty performing the following activities:  Hearing? 0  Vision? 0  Difficulty concentrating or making decisions? 0  Walking or climbing stairs? 0  Dressing or bathing? 0  Doing errands, shopping? 0  Preparing Food and eating ? N  Using the Toilet? N  In the past six months, have you accidently leaked urine? N  Do you have problems with loss of bowel control? N  Managing your Medications? N  Managing your Finances? N  Housekeeping or managing your Housekeeping? N    Patient Care Team: Corwin Antu, FNP as PCP - General (Family Medicine) Dewey Rush, MD as Consulting  Physician (Radiation Oncology) Ebbie Cough, MD as Consulting Physician (General Surgery) Odean Potts, MD as Consulting Physician (Hematology and Oncology) Livingston Rigg, MD (Inactive) as Consulting Physician (Dermatology) O'Neal, Darryle Ned, MD as Consulting Physician (Cardiology)  Indicate any recent Medical Services you may have received from other than [facility] providers in the past year (date may be approximate).     Assessment:   This is a routine wellness examination for Lisa Gallegos.  Hearing/Vision screen Hearing Screening - Comments:: Pt says her hearing is good Vision Screening - Comments:: Pt says her vision is good w/contacts [facility]   Goals Addressed             This Visit's Progress    Weight (lb) < 160 lb (72.6 kg)   177 lb 9.6 oz (80.6 kg)    I would like to lose weight        Depression Screen    11/22/2023    9:04 AM 03/19/2023   11:12 AM 02/26/2023   10:50 AM 04/02/2019    8:48 AM 12/27/2016    9:53 AM 06/12/2016    7:24 AM  PHQ 2/9 Scores  PHQ - 2 Score 0 0 0 0 0 0  PHQ- 9 Score   0 0      Fall Risk    11/20/2023   11:12 AM 03/19/2023   11:12 AM 02/26/2023   10:49 AM 12/27/2016    9:53 AM 06/12/2016    7:24 AM  Fall Risk   Falls in the past year? 0 0 0 No No  Number falls in past yr: 0 0 0    Injury with Fall? 0 0 0    Risk for fall due to : No Fall Risks      Follow up Education provided;Falls prevention discussed Falls evaluation completed;Education provided;Falls prevention discussed Falls evaluation completed;Education provided;Falls prevention discussed      MEDICARE RISK AT HOME: Medicare Risk at Home Any stairs in or around the home?: (Patient-Rptd) Yes If so, are there any without handrails?: (Patient-Rptd) No Home free of loose throw  rugs in walkways, pet beds, electrical cords, etc?: (Patient-Rptd) Yes Adequate lighting in your home to reduce risk of falls?: (Patient-Rptd) Yes Life alert?: (Patient-Rptd) No Use of a  cane, walker or w/c?: (Patient-Rptd) No Grab bars in the bathroom?: (Patient-Rptd) No Shower chair or bench in shower?: (Patient-Rptd) No Elevated toilet seat or a handicapped toilet?: (Patient-Rptd) No  TIMED UP AND GO:  Was the test performed? Yes  Length of time to ambulate 10 feet: 8 sec Gait steady and fast without use of assistive device    Cognitive Function:        11/22/2023    9:31 AM  6CIT Screen  What Year? 0 points  What month? 0 points  What time? 0 points  Count back from 20 0 points  Months in reverse 0 points  Repeat phrase 0 points  Total Score 0 points    Immunizations Immunization History  Administered Date(s) Administered   Tdap 05/16/2014    TDAP status: Up to date  Flu Vaccine status: Declined, Education has been provided regarding the importance of this vaccine but patient still declined. Advised may receive this vaccine at local pharmacy or Health Dept. Aware to provide a copy of the vaccination record if obtained from local pharmacy or Health Dept. Verbalized acceptance and understanding.  Pneumococcal vaccine status: Declined,  Education has been provided regarding the importance of this vaccine but patient still declined. Advised may receive this vaccine at local pharmacy or Health Dept. Aware to provide a copy of the vaccination record if obtained from local pharmacy or Health Dept. Verbalized acceptance and understanding.   Covid-19 vaccine status: Declined, Education has been provided regarding the importance of this vaccine but patient still declined. Advised may receive this vaccine at local pharmacy or Health Dept.or vaccine clinic. Aware to provide a copy of the vaccination record if obtained from local pharmacy or Health Dept. Verbalized acceptance and understanding.  Qualifies for Shingles Vaccine? Yes   Zostavax completed No   Shingrix Completed?: No.    Education has been provided regarding the importance of this vaccine. Patient has  been advised to call insurance company to determine out of pocket expense if they have not yet received this vaccine. Advised may also receive vaccine at local pharmacy or Health Dept. Verbalized acceptance and understanding.  Screening Tests Health Maintenance  Topic Date Due   Zoster Vaccines- Shingrix (1 of 2) Never done   INFLUENZA VACCINE  05/17/2023   COVID-19 Vaccine (1) 02/26/2024 (Originally 11/20/1960)   Pneumonia Vaccine 8+ Years old (1 of 2 - PCV) 03/18/2024 (Originally 11/21/1961)   DTaP/Tdap/Td (2 - Td or Tdap) 05/16/2024   Medicare Annual Wellness (AWV)  11/21/2024   MAMMOGRAM  11/22/2024   DEXA SCAN  03/26/2025   Colonoscopy  12/27/2025   Hepatitis C Screening  Completed   HPV VACCINES  Aged Out    Health Maintenance  Health Maintenance Due  Topic Date Due   Zoster Vaccines- Shingrix (1 of 2) Never done   INFLUENZA VACCINE  05/17/2023    Colorectal cancer screening: Type of screening: Colonoscopy. Completed yes. Repeat every 3 years  Mammogram status: Completed yes appt Dec 14, 2023. Repeat every year  Bone Density status: Completed 03/27/23. Results reflect: Bone density results: NORMAL. Repeat every 5 years.  Lung Cancer Screening: (Low Dose CT Chest recommended if Age 6-80 years, 20 pack-year currently smoking OR have quit w/in 15years.) does not qualify.   Lung Cancer Screening Referral: no  Additional Screening:  Hepatitis  C Screening: does not qualify; Completed 01/02/2017  Vision Screening: Recommended annual ophthalmology exams for early detection of glaucoma and other disorders of the eye. Is the patient up to date with their annual eye exam?  Yes  Who is the provider or what is the name of the office in which the patient attends annual eye exams? [facility] If pt is not established with a provider, would they like to be referred to a provider to establish care? No .   Dental Screening: Recommended annual dental exams for proper oral  hygiene  Diabetic Foot Exam: n/a  Community Resource Referral / Chronic Care Management: CRR required this visit?  No   CCM required this visit?  No     Plan:     I have personally reviewed and noted the following in the patient's chart:   Medical and social history Use of alcohol, tobacco or illicit drugs  Current medications and supplements including opioid prescriptions. Patient is not currently taking opioid prescriptions. Functional ability and status Nutritional status Physical activity Advanced directives List of other physicians Hospitalizations, surgeries, and ER visits in previous 12 months Vitals Screenings to include cognitive, depression, and falls Referrals and appointments  In addition, I have reviewed and discussed with patient certain preventive protocols, quality metrics, and best practice recommendations. A written personalized care plan for preventive services as well as general preventive health recommendations were provided to patient.     Erminio LITTIE Saris, LPN   04/17/7973   After Visit Summary: (MyChart) Due to this being a telephonic visit, the after visit summary with patients personalized plan was offered to patient via MyChart   Nurse Notes: The patient states she is doing well and has no concerns or questions at this time.

## 2023-11-26 NOTE — Patient Instructions (Signed)
 Lisa Gallegos , Thank you for taking time to come for your Medicare Wellness Visit. I appreciate your ongoing commitment to your health goals. Please review the following plan we discussed and let me know if I can assist you in the future.   Referrals/Orders/Follow-Ups/Clinician Recommendations: none  This is a list of the screening recommended for you and due dates:  Health Maintenance  Topic Date Due   Zoster (Shingles) Vaccine (1 of 2) Never done   Flu Shot  05/17/2023   COVID-19 Vaccine (1) 02/26/2024*   Pneumonia Vaccine (1 of 2 - PCV) 03/18/2024*   DTaP/Tdap/Td vaccine (2 - Td or Tdap) 05/16/2024   Medicare Annual Wellness Visit  11/21/2024   Mammogram  11/22/2024   DEXA scan (bone density measurement)  03/26/2025   Colon Cancer Screening  12/27/2025   Hepatitis C Screening  Completed   HPV Vaccine  Aged Out  *Topic was postponed. The date shown is not the original due date.    Advanced directives: (Declined) Advance directive discussed with you today. Even though you declined this today, please call our office should you change your mind, and we can give you the proper paperwork for you to fill out.  Next Medicare Annual Wellness Visit scheduled for next year: Yes

## 2023-11-26 NOTE — Progress Notes (Addendum)
 CORRECTION TO NOTE: Pt's visit was in person on 11/22/2023 @ 0850.pt performed/answered pre-health check in questions on 11/20/23...B.Xai Frerking,LPN

## 2023-11-27 NOTE — Progress Notes (Signed)
  CORRECTION: Subjective:   Lisa Gallegos is a 68 y.o. female who presents for an Initial Medicare Annual Wellness Visit.  Visit Complete: In person  Patient Medicare AWV questionnaire was completed by the patient on 11/20/2023; I have confirmed that all information answered by patient is correct and no changes since this date.  B.Kamiyah Kindel,LPN

## 2023-12-12 ENCOUNTER — Ambulatory Visit
Admission: RE | Admit: 2023-12-12 | Discharge: 2023-12-12 | Disposition: A | Discharge status: Home / Self Care | Payer: Medicare Other | Source: Ambulatory Visit | Attending: Adult Health | Admitting: Adult Health

## 2023-12-12 DIAGNOSIS — Z9889 Other specified postprocedural states: Secondary | ICD-10-CM

## 2024-01-09 ENCOUNTER — Ambulatory Visit (INDEPENDENT_AMBULATORY_CARE_PROVIDER_SITE_OTHER): Admitting: Family

## 2024-01-09 ENCOUNTER — Encounter: Payer: Self-pay | Admitting: Family

## 2024-01-09 VITALS — BP 126/82 | Temp 98.3°F | Ht 65.0 in | Wt 175.0 lb

## 2024-01-09 DIAGNOSIS — H6692 Otitis media, unspecified, left ear: Secondary | ICD-10-CM | POA: Insufficient documentation

## 2024-01-09 MED ORDER — AZITHROMYCIN 250 MG PO TABS: ORAL_TABLET | ORAL | 0 refills | Status: AC

## 2024-01-09 NOTE — Assessment & Plan Note (Addendum)
 Take antibiotic as prescribed. Increase oral fluids. Pt to f/u if sx worsen and or fail to improve in 2-3 days.  Rx zpack 250 mg  Preferred: augmentin, pt declines states it gives her diarrhea but she is willing to try this if no relief of sx in 24-48 hours Advised start zyrtec and flonase

## 2024-01-09 NOTE — Progress Notes (Signed)
 Established Patient Office Visit  Subjective:   Patient ID: Lisa Gallegos, female    DOB: Jan 24, 1956  Age: 68 y.o. MRN: [id number]  CC:  Chief Complaint  Patient presents with   Acute Visit    Possible sinus infection - reports sinus pressure/pain, ear pain    HPI: Lisa Gallegos is a 68 y.o. female presenting on 01/09/2024 for Acute Visit (Possible sinus infection - reports sinus pressure/pain, ear pain)  9 days ago started with symptoms and her entire family is also sick.  With c/o sinus pressure, pain, and left ear pain. No sore throat, only was day one. Slight dry non productive cough.   Rhinorrhea post nasal drip No fever.  Can only take nyquil otherwise she can't sleep so she is doing this and saline mist.        ROS: Negative unless specifically indicated above in HPI.   Relevant past medical history reviewed and updated as indicated.   Allergies and medications reviewed and updated.   Current Outpatient Medications:    anastrozole (ARIMIDEX) 1 MG tablet, TAKE 1 TABLET BY MOUTH EVERY DAY, Disp: 90 tablet, Rfl: 3   azithromycin (ZITHROMAX) 250 MG tablet, Take 2 tablets on day 1, then 1 tablet daily on days 2 through 5, Disp: 6 tablet, Rfl: 0   Cholecalciferol (VITAMIN D3) 125 MCG (5000 UT) TABS, Take 5,000 Units by mouth daily in the afternoon., Disp: , Rfl:    Coenzyme Q10 (CO Q 10 PO), Take 200 mg by mouth daily in the afternoon., Disp: , Rfl:    Cyanocobalamin (VITAMIN B 12 PO), Take 500 mcg by mouth daily in the afternoon., Disp: , Rfl:    Magnesium 400 MG TABS, Take 400 mg by mouth daily in the afternoon., Disp: , Rfl:    Omega-3 1000 MG CAPS, Take by mouth. Algae omega, Disp: , Rfl:    OVER THE COUNTER MEDICATION, Take 150 mg by mouth. Annatto-E, Disp: , Rfl:    RA TURMERIC EXTRA STRENGTH PO, Take 500 mg by mouth daily., Disp: , Rfl:    rosuvastatin (CRESTOR) 10 MG tablet, Take 1 tablet (10 mg total) by mouth daily., Disp: 90 tablet, Rfl: 3  Allergies  Allergen  Reactions   Atorvastatin Other (See Comments)    Myalgias     Objective:   BP 126/82 (BP Location: Right Arm, Patient Position: Sitting, Cuff Size: Normal)   Temp 98.3 F (36.8 C) (Temporal)   Ht 5\' 5"  (1.651 m)   Wt 175 lb (79.4 kg)   LMP 02/17/2012   BMI 29.12 kg/m    Physical Exam Vitals reviewed.  Constitutional:      General: She is not in acute distress.    Appearance: Normal appearance. She is normal weight. She is not ill-appearing, toxic-appearing or diaphoretic.  HENT:     Head: Normocephalic.     Right Ear: Tympanic membrane normal.     Left Ear: Tympanic membrane normal.     Nose: Nose normal.     Mouth/Throat:     Mouth: Mucous membranes are dry.     Pharynx: No oropharyngeal exudate or posterior oropharyngeal erythema.  Eyes:     Extraocular Movements: Extraocular movements intact.     Pupils: Pupils are equal, round, and reactive to light.  Cardiovascular:     Rate and Rhythm: Normal rate and regular rhythm.     Pulses: Normal pulses.     Heart sounds: Normal heart sounds.  Pulmonary:     Effort: Pulmonary  effort is normal.     Breath sounds: Normal breath sounds.  Musculoskeletal:     Cervical back: Normal range of motion.  Neurological:     General: No focal deficit present.     Mental Status: She is alert and oriented to person, place, and time. Mental status is at baseline.  Psychiatric:        Mood and Affect: Mood normal.        Behavior: Behavior normal.        Thought Content: Thought content normal.        Judgment: Judgment normal.     Assessment & Plan:  Left acute otitis media Assessment & Plan: Take antibiotic as prescribed. Increase oral fluids. Pt to f/u if sx worsen and or fail to improve in 2-3 days.  Rx zpack 250 mg  Preferred: augmentin, pt declines states it gives her diarrhea but she is willing to try this if no relief of sx in 24-48 hours Advised start zyrtec and flonase  Orders: -     Azithromycin; Take 2 tablets on  day 1, then 1 tablet daily on days 2 through 5  Dispense: 6 tablet; Refill: 0     Follow up plan: Return if symptoms worsen or fail to improve.  Mort Sawyers, FNP

## 2024-04-28 ENCOUNTER — Ambulatory Visit: Payer: Medicare Other | Attending: General Surgery

## 2024-04-28 DIAGNOSIS — Z483 Aftercare following surgery for neoplasm: Secondary | ICD-10-CM | POA: Insufficient documentation

## 2024-05-02 ENCOUNTER — Ambulatory Visit: Admitting: Rehabilitation

## 2024-05-02 ENCOUNTER — Encounter: Payer: Self-pay | Admitting: Rehabilitation

## 2024-05-02 DIAGNOSIS — Z483 Aftercare following surgery for neoplasm: Secondary | ICD-10-CM

## 2024-05-02 NOTE — Therapy (Signed)
 OUTPATIENT PHYSICAL THERAPY SOZO SCREENING NOTE   Patient Name: Lisa Gallegos MRN: [id number] DOB:1956/01/20, 68 y.o., female Today's Date: 05/02/2024  PCP: Corwin Antu, FNP REFERRING PROVIDER: Ebbie Cough, MD   PT End of Session - 05/02/24 1003     Visit Number 4   screen   PT Start Time 0955    PT Stop Time 1000    PT Time Calculation (min) 5 min    Activity Tolerance Patient tolerated treatment well    Behavior During Therapy [facility] for tasks assessed/performed          Past Medical History:  Diagnosis Date   Anemia    Anxiety    Cancer (HCC) 11/2020   left breast IDC   Family history of pancreatic cancer 12/16/2020   Hyperlipidemia    Joint pain    Malignant neoplasm of upper-outer quadrant of left breast in female, 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/03/2020   Personal history of radiation therapy    Vitamin D  deficiency    Past Surgical History:  Procedure Laterality Date   BREAST LUMPECTOMY WITH RADIOACTIVE SEED AND AXILLARY LYMPH NODE DISSECTION Left 12/23/2020   Procedure: LEFT BREAST LUMPECTOMY WITH RADIOACTIVE SEED AND LEFT AXILLARY LYMPH NODE BIOPSY;  Surgeon: Ebbie Cough, MD;  Location: [facility];  Service: General;  Laterality: Left;  PEC BLOCK; START TIME OF 11:30 AM FOR 60 MINUTES ROOM 2 [place] [facility]   BREAST SURGERY     CESAREAN SECTION  1985   FTP   COLONOSCOPY WITH PROPOFOL  N/A 12/28/2022   Procedure: COLONOSCOPY WITH PROPOFOL ;  Surgeon: Unk Corinn Skiff, MD;  Location: [facility];  Service: Gastroenterology;  Laterality: N/A;   KIDNEY STONE SURGERY     laser surgery   LAPAROSCOPIC APPENDECTOMY N/A 05/10/2018   Procedure: APPENDECTOMY LAPAROSCOPIC;  Surgeon: Mikell Katz, MD;  Location: [facility] OR;  Service: General;  Laterality: N/A;   Patient Active Problem List   Diagnosis Date Noted   Insulin  resistance - new onset 06/21/2023   Generalized obesity- Starting BMI 30.26 06/21/2023   BMI 28.0-28.9,adult  Current BMI 28.8 06/21/2023   History of colon polyps 03/19/2023   Elevated LDL cholesterol level 03/19/2023   Arthralgia of both hands 03/07/2022   History of breast cancer 03/06/2022   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 Patient underwent a left lumpectomy and sentinel node biopsy (3 negative nodes) on 12/23/2020. It is ER/PR positive and HER2 negative with a Ki67 of 5%.   PRECAUTIONS: left UE Lymphedema risk, None  SUBJECTIVE: Pt returns for her 6 month L-Dex screen.   PAIN:  Are you having pain? No  SOZO SCREENING: Patient was assessed today using the SOZO machine to determine the lymphedema index score. This was compared to her baseline score. It was determined that she is within the recommended range when compared to her baseline and no further action is needed at this time. She will continue SOZO screenings. These are done every 3 months for 2 years post operatively followed by every  6 months for 2 years, and then annually.   L-DEX FLOWSHEETS - 05/02/24 1000       L-DEX LYMPHEDEMA SCREENING   Measurement Type Unilateral    L-DEX MEASUREMENT EXTREMITY Upper Extremity    POSITION  Standing    DOMINANT SIDE Right    At Risk Side Left    BASELINE SCORE (UNILATERAL) 2.2    L-DEX SCORE (UNILATERAL) 3.8    VALUE CHANGE (UNILAT) 1.6           Robert Sperl R, PT 05/02/2024, 10:04 AM

## 2024-06-02 ENCOUNTER — Ambulatory Visit: Admitting: Family

## 2024-06-04 ENCOUNTER — Ambulatory Visit (INDEPENDENT_AMBULATORY_CARE_PROVIDER_SITE_OTHER): Admitting: Family

## 2024-06-04 ENCOUNTER — Encounter: Payer: Self-pay | Admitting: Family

## 2024-06-04 VITALS — BP 132/80 | HR 83 | Temp 98.9°F | Ht 65.0 in | Wt 178.0 lb

## 2024-06-04 DIAGNOSIS — E782 Mixed hyperlipidemia: Secondary | ICD-10-CM

## 2024-06-04 DIAGNOSIS — Z79899 Other long term (current) drug therapy: Secondary | ICD-10-CM | POA: Diagnosis not present

## 2024-06-04 DIAGNOSIS — E559 Vitamin D deficiency, unspecified: Secondary | ICD-10-CM | POA: Diagnosis not present

## 2024-06-04 DIAGNOSIS — K5900 Constipation, unspecified: Secondary | ICD-10-CM

## 2024-06-04 DIAGNOSIS — Z6828 Body mass index (BMI) 28.0-28.9, adult: Secondary | ICD-10-CM

## 2024-06-04 LAB — COMPREHENSIVE METABOLIC PANEL WITH GFR
ALT: 18 U/L (ref 0–35)
AST: 20 U/L (ref 0–37)
Albumin: 4.3 g/dL (ref 3.5–5.2)
Alkaline Phosphatase: 92 U/L (ref 39–117)
BUN: 28 mg/dL — ABNORMAL HIGH (ref 6–23)
CO2: 29 meq/L (ref 19–32)
Calcium: 9.4 mg/dL (ref 8.4–10.5)
Chloride: 102 meq/L (ref 96–112)
Creatinine, Ser: 0.8 mg/dL (ref 0.40–1.20)
GFR: 76.72 mL/min (ref >60.00)
Glucose, Bld: 97 mg/dL (ref 70–99)
Potassium: 3.9 meq/L (ref 3.5–5.1)
Sodium: 139 meq/L (ref 135–145)
Total Bilirubin: 0.5 mg/dL (ref 0.2–1.2)
Total Protein: 7.4 g/dL (ref 6.0–8.3)

## 2024-06-04 LAB — LIPID PANEL
Cholesterol: 256 mg/dL — ABNORMAL HIGH (ref 0–200)
HDL: 50 mg/dL (ref >39.00)
LDL Cholesterol: 170 mg/dL — ABNORMAL HIGH (ref 0–99)
NonHDL: 205.89
Total CHOL/HDL Ratio: 5
Triglycerides: 177 mg/dL — ABNORMAL HIGH (ref 0.0–149.0)
VLDL: 35.4 mg/dL (ref 0.0–40.0)

## 2024-06-04 LAB — VITAMIN D 25 HYDROXY (VIT D DEFICIENCY, FRACTURES): VITD: 41.37 ng/mL (ref 30.00–100.00)

## 2024-06-04 MED ORDER — LOVASTATIN 20 MG PO TABS: 20.0000 mg | ORAL_TABLET | Freq: Every day | ORAL | 0 refills | Status: DC

## 2024-06-04 NOTE — Progress Notes (Signed)
 Subjective:  Patient ID: Lisa Gallegos, female    DOB: [birth year]  Age: 68 y.o. MRN: [id number]  Patient Care Team: Corwin Antu, FNP as PCP - General (Family Medicine) Dewey Rush, MD as Consulting Physician (Radiation Oncology) Ebbie Cough, MD as Consulting Physician (General Surgery) Odean Potts, MD as Consulting Physician (Hematology and Oncology) Livingston Rigg, MD as Consulting Physician (Dermatology) O'Neal, Darryle Ned, MD as Consulting Physician (Cardiology) [facility], [facility]   CC:   Chief Complaint  Patient presents with   Annual Exam    HPI Lisa Gallegos is a 68 y.o. female who presents today for an annual physical exam. She reports consuming a general diet. Gym/ health club routine includes cardio, light weights, stationary bike, and treadmill. She generally feels well. She reports sleeping well. She does have additional problems to discuss today.   Vision:Within last year Dental:Receives regular dental care  Mammogram: 12/12/23 annual screening  Last pap: > 65 y./o  Colonoscopy:12/28/22 every three years  Bone density scan: 03/27/23  Pt is with  acute concerns.   Discussed the use of AI scribe software for clinical note transcription with the patient, who gave verbal consent to proceed.  History of Present Illness Lisa Gallegos is a 68 year old female who presents with knee pain exacerbated by exercise.  She experiences knee pain, particularly during activities such as squats and running. The pain is exacerbated by running and certain lower body exercises but is not consistently present. The pain is located around the iliotibial band on both sides, with no associated swelling, hip pain, or low back pain. She has a history of running but has reduced her running activities due to knee concerns.  She is currently on anastrozole  for breast cancer treatment for the past three years, with two more years to go, and has stopped taking statins due to  joint pain exacerbation. She reports that the anastrozole  has caused her joints to feel 'dusty' and has noticed changes in her skin and joints. She uses vaginal estrogen as prescribed by her oncologist to manage some of the side effects.  She mentions experiencing constipation-like symptoms, describing her stools as 'little marbles' despite having daily bowel movements. She attributes this to a possible hydration issue.  She has recently started strength training with her boyfriend at a gym similar to [employer]. She reports good sleep quality.    Advanced Directives Patient does not have advanced directives     DEPRESSION SCREENING    06/04/2024   11:32 AM 11/22/2023    9:04 AM 03/19/2023   11:12 AM 02/26/2023   10:50 AM 04/02/2019    8:48 AM 12/27/2016    9:53 AM 06/12/2016    7:24 AM  PHQ 2/9 Scores  PHQ - 2 Score 0 0 0 0 0 0 0  PHQ- 9 Score 0   0 0       ROS: Negative unless specifically indicated above in HPI.    Current Outpatient Medications:    lovastatin  (MEVACOR ) 20 MG tablet, Take 1 tablet (20 mg total) by mouth at bedtime., Disp: 30 tablet, Rfl: 0   anastrozole  (ARIMIDEX ) 1 MG tablet, TAKE 1 TABLET BY MOUTH EVERY DAY, Disp: 90 tablet, Rfl: 3   Cholecalciferol (VITAMIN D3) 125 MCG (5000 UT) TABS, Take 5,000 Units by mouth daily in the afternoon., Disp: , Rfl:    Coenzyme Q10 (CO Q 10 PO), Take 200 mg by mouth daily in the afternoon., Disp: , Rfl:    Cyanocobalamin  (VITAMIN B  12 PO), Take 500 mcg by mouth daily in the afternoon., Disp: , Rfl:    Magnesium 400 MG TABS, Take 400 mg by mouth daily in the afternoon., Disp: , Rfl:    Omega-3 1000 MG CAPS, Take by mouth. Algae omega, Disp: , Rfl:    OVER THE COUNTER MEDICATION, Take 150 mg by mouth. Annatto-E, Disp: , Rfl:    RA TURMERIC EXTRA STRENGTH PO, Take 500 mg by mouth daily., Disp: , Rfl:     Objective:    BP 132/80 (BP Location: Right Arm, Patient Position: Sitting, Cuff Size: Normal)   Pulse 83   Temp 98.9 F (37.2  C) (Temporal)   Ht 5' 5 (1.651 m)   Wt 178 lb (80.7 kg)   LMP 02/17/2012   SpO2 99%   BMI 29.62 kg/m   BP Readings from Last 3 Encounters:  06/04/24 132/80  01/09/24 126/82  11/22/23 126/70      Physical Exam Vitals reviewed.  Constitutional:      General: She is not in acute distress.    Appearance: Normal appearance. She is normal weight. She is not ill-appearing.  HENT:     Head: Normocephalic.     Right Ear: Tympanic membrane normal.     Left Ear: Tympanic membrane normal.     Nose: Nose normal.     Mouth/Throat:     Mouth: Mucous membranes are moist.  Eyes:     Extraocular Movements: Extraocular movements intact.     Pupils: Pupils are equal, round, and reactive to light.  Cardiovascular:     Rate and Rhythm: Normal rate and regular rhythm.  Pulmonary:     Effort: Pulmonary effort is normal.     Breath sounds: Normal breath sounds.  Abdominal:     General: Abdomen is flat. Bowel sounds are normal.     Palpations: Abdomen is soft.     Tenderness: There is no guarding or rebound.  Musculoskeletal:        General: Normal range of motion.     Cervical back: Normal range of motion.  Skin:    General: Skin is warm.     Capillary Refill: Capillary refill takes less than 2 seconds.  Neurological:     General: No focal deficit present.     Mental Status: She is alert.  Psychiatric:        Mood and Affect: Mood normal.        Behavior: Behavior normal.        Thought Content: Thought content normal.        Judgment: Judgment normal.       Results LABS Alkaline Phosphatase: elevated Vitamin B12: normal      Assessment & Plan:   Assessment and Plan Assessment & Plan Bilateral knee osteoarthritis Suspected bilateral knee osteoarthritis, likely exacerbated by high-impact activities such as running and the use of anastrozole . She reports intermittent knee pain, particularly during activities like squats and running, but no swelling, hip pain, or low back  pain. The pain is localized around the iliotibial band area. She has ceased running due to knee issues. The current exercise regimen includes strength training and some running, which may be contributing to the symptoms. - Avoid high-impact activities such as running. - Engage in low-impact exercises like cycling and rowing. - Use ibuprofen  or Aleve as needed for pain management. - Incorporate gentle stretching and strengthening exercises for knee support. - Ensure proper form during exercises and use supportive footwear. - Consider using  a massage roller or massage gun for iliotibial band relief.  Mixed hyperlipidemia She has mixed hyperlipidemia and previously discontinued statins due to exacerbation of joint pain. She is currently not on any statin therapy. There is a need to manage cardiovascular risk, and a trial of lovastatin  is considered reasonable. - Initiate lovastatin  three times a week (Monday, Wednesday, Friday) at night. - Monitor tolerance to lovastatin  and adjust dosage if needed. - Order cholesterol panel to assess current lipid levels. - Consider alternative lipid-lowering therapies if lovastatin  is not tolerated.  Constipation She reports daily bowel movements with hard, pellet-like stools, which may be indicative of constipation. She attributes this to anastrozole  use and possible dehydration. She does not report significant discomfort but acknowledges a noticeable change in bowel habits.  ------------------------------------ Add fiber supplement once daily.  Add a probiotic (such as Florastor) daily. Drink 64 oz of water a day. Eat lots of fresh fruit and veggies. Ensure regular exercise.    If you are not able to have regular BM's with the above regimen, you may add miralax  1 tablespoon daily.  Increase or decrease amount/frequency as needed to ensure 1 soft BM/day.   ------------------------------------  Recording duration: 18 minutes        Follow-up:  Return in about 1 year (around 06/04/2025) for f/u CPE.   Ginger Patrick, FNP

## 2024-06-05 ENCOUNTER — Ambulatory Visit: Payer: Self-pay | Admitting: Family

## 2024-06-05 DIAGNOSIS — E7849 Other hyperlipidemia: Secondary | ICD-10-CM

## 2024-06-26 ENCOUNTER — Other Ambulatory Visit: Payer: Self-pay | Admitting: Family

## 2024-06-26 DIAGNOSIS — E782 Mixed hyperlipidemia: Secondary | ICD-10-CM

## 2024-07-24 ENCOUNTER — Ambulatory Visit

## 2024-07-24 ENCOUNTER — Ambulatory Visit
Admission: EM | Admit: 2024-07-24 | Discharge: 2024-07-24 | Disposition: A | Discharge status: Home / Self Care | Attending: Emergency Medicine | Admitting: Emergency Medicine

## 2024-07-24 ENCOUNTER — Ambulatory Visit: Payer: Self-pay

## 2024-07-24 DIAGNOSIS — R030 Elevated blood-pressure reading, without diagnosis of hypertension: Secondary | ICD-10-CM

## 2024-07-24 DIAGNOSIS — S9032XA Contusion of left foot, initial encounter: Secondary | ICD-10-CM

## 2024-07-24 NOTE — Discharge Instructions (Addendum)
 Take Tylenol  or ibuprofen  as directed.  Rest and elevate your foot.  Apply ice packs as directed.  Follow up with a podiatrist such as the one listed below.    Your blood pressure is elevated today at 162/77; recheck 179/73.  Please have this rechecked by your primary care provider tomorrow.

## 2024-07-24 NOTE — ED Provider Notes (Signed)
 CAY RALPH PELT    CSN: [id number] Arrival date & time: 07/24/24  1011      History   Chief Complaint Chief Complaint  Patient presents with   Foot Injury    HPI Lisa Gallegos is a 68 y.o. female.  Patient presents with pain and bruising of her left foot around her 4th and 5th toes after she accidentally kicked a grandchild's toy yesterday.  She has been treating it with ice packs.  No open wounds, numbness, weakness.  Patient reports no history of hypertension.  She denies chest pain, shortness of breath, focal weakness, dizziness, headache.  The history is provided by the patient and medical records.    Past Medical History:  Diagnosis Date   Anemia    Anxiety    Cancer (HCC) 11/2020   left breast IDC   Family history of pancreatic cancer 12/16/2020   Hyperlipidemia    Joint pain    Malignant neoplasm of upper-outer quadrant of left breast in female, 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/03/2020   Personal history of radiation therapy    Vitamin D  deficiency     Patient Active Problem List   Diagnosis Date Noted   Insulin  resistance - new onset 06/21/2023   Generalized obesity- Starting BMI 30.26 06/21/2023   BMI 28.0-28.9,adult Current BMI 28.8 06/21/2023   History of colon polyps 03/19/2023   Elevated LDL cholesterol level 03/19/2023   Arthralgia of both hands 03/07/2022   History of breast cancer 03/06/2022   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 03/06/2022   Vitamin B12 deficiency 03/06/2022   On statin therapy 03/06/2022   Malignant neoplasm of upper-outer quadrant of left breast in female, 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/03/2020   Situational anxiety 04/05/2018   Vitamin D  deficiency 04/05/2018   Hyperlipidemia 03/24/2015    Past Surgical History:  Procedure Laterality Date   BREAST LUMPECTOMY WITH RADIOACTIVE SEED AND AXILLARY LYMPH NODE DISSECTION Left 12/23/2020   Procedure: LEFT BREAST LUMPECTOMY WITH RADIOACTIVE SEED  AND LEFT AXILLARY LYMPH NODE BIOPSY;  Surgeon: Ebbie Cough, MD;  Location: [facility];  Service: General;  Laterality: Left;  PEC BLOCK; START TIME OF 11:30 AM FOR 60 MINUTES ROOM 2 [place] [facility]   BREAST SURGERY     CESAREAN SECTION  1985   FTP   COLONOSCOPY WITH PROPOFOL  N/A 12/28/2022   Procedure: COLONOSCOPY WITH PROPOFOL ;  Surgeon: Unk Corinn Skiff, MD;  Location: [facility];  Service: Gastroenterology;  Laterality: N/A;   KIDNEY STONE SURGERY     laser surgery   LAPAROSCOPIC APPENDECTOMY N/A 05/10/2018   Procedure: APPENDECTOMY LAPAROSCOPIC;  Surgeon: Mikell Katz, MD;  Location: [facility] OR;  Service: General;  Laterality: N/A;    OB History   No obstetric history on file.      Home Medications    Prior to Admission medications   Medication Sig Start Date End Date Taking? Authorizing Provider  anastrozole  (ARIMIDEX ) 1 MG tablet TAKE 1 TABLET BY MOUTH EVERY DAY 08/23/23   Gudena, Vinay, MD  Cholecalciferol (VITAMIN D3) 125 MCG (5000 UT) TABS Take 5,000 Units by mouth daily in the afternoon.    [provider]  Coenzyme Q10 (CO Q 10 PO) Take 200 mg by mouth daily in the afternoon.    [provider]  Cyanocobalamin  (VITAMIN B 12 PO) Take 500 mcg by mouth daily in the afternoon.    [provider]  lovastatin  (MEVACOR ) 20 MG tablet TAKE 1 TABLET BY MOUTH EVERYDAY AT  BEDTIME 06/26/24   Dugal, Tabitha, FNP  Magnesium 400 MG TABS Take 400 mg by mouth daily in the afternoon.    [provider]  Omega-3 1000 MG CAPS Take by mouth. Algae omega    [provider]  OVER THE COUNTER MEDICATION Take 150 mg by mouth. Annatto-E    [provider]  RA TURMERIC EXTRA STRENGTH PO Take 500 mg by mouth daily.    [provider]    Family History Family History  Problem Relation Age of Onset   Pancreatic cancer Mother 7   Heart disease Father    Hypertension Father    Hyperlipidemia Father    Healthy  Sister    Healthy Brother    Other Son        meningioma   Lung cancer Maternal Aunt        dx after 50, smoking hx   Breast cancer Neg Hx     Social History Social History   Tobacco Use   Smoking status: Never   Smokeless tobacco: Never  Vaping Use   Vaping status: Never Used  Substance Use Topics   Alcohol use: Not Currently    Alcohol/week: 0.0 standard drinks of alcohol   Drug use: No     Allergies   Atorvastatin  and Rosuvastatin    Review of Systems Review of Systems  Constitutional:  Negative for chills and fever.  Respiratory:  Negative for chest tightness and shortness of breath.   Cardiovascular:  Negative for chest pain and palpitations.  Musculoskeletal:  Positive for arthralgias and joint swelling.  Skin:  Positive for color change. Negative for wound.  Neurological:  Negative for dizziness, weakness, numbness and headaches.     Physical Exam Triage Vital Signs ED Triage Vitals  Encounter Vitals Group     BP      Girls Systolic BP Percentile      Girls Diastolic BP Percentile      Boys Systolic BP Percentile      Boys Diastolic BP Percentile      Pulse      Resp      Temp      Temp src      SpO2      Weight      Height      Head Circumference      Peak Flow      Pain Score      Pain Loc      Pain Education      Exclude from Growth Chart    No data found.  Updated Vital Signs BP (!) 179/73   Pulse 80   Temp 98.2 F (36.8 C)   Resp 18   LMP 02/17/2012   SpO2 98%   Visual Acuity Right Eye Distance:   Left Eye Distance:   Bilateral Distance:    Right Eye Near:   Left Eye Near:    Bilateral Near:     Physical Exam Constitutional:      General: She is not in acute distress. Cardiovascular:     Rate and Rhythm: Normal rate and regular rhythm.     Heart sounds: Normal heart sounds.  Pulmonary:     Effort: Pulmonary effort is normal. No respiratory distress.     Breath sounds: Normal breath sounds.  Musculoskeletal:         General: Swelling and tenderness present. No deformity. Normal range of motion.       Feet:  Skin:  General: Skin is warm and dry.     Capillary Refill: Capillary refill takes less than 2 seconds.     Findings: Bruising present. No lesion.  Neurological:     General: No focal deficit present.     Mental Status: She is alert.     Sensory: No sensory deficit.     Motor: No weakness.     Gait: Gait normal.      UC Treatments / Results  Labs (all labs ordered are listed, but only abnormal results are displayed) Labs Reviewed - No data to display  EKG   Radiology DG Foot Complete Left Result Date: 07/24/2024 CLINICAL DATA:  Fourth and fifth toe pain after kicking grandchild's toy. EXAM: LEFT FOOT - COMPLETE 3+ VIEW COMPARISON:  None Available. FINDINGS: There is no evidence of acute fracture or dislocation. Mild first MTP joint osteoarthritis. Soft tissues are unremarkable. No radiopaque foreign body. IMPRESSION: 1. No acute osseous abnormality. 2. Mild first MTP joint osteoarthritis. Electronically Signed   By: Harrietta Sherry M.D.   On: 07/24/2024 11:10    Procedures Procedures (including critical care time)  Medications Ordered in UC Medications - No data to display  Initial Impression / Assessment and Plan / UC Course  I have reviewed the triage vital signs and the nursing notes.  Pertinent labs & imaging results that were available during my care of the patient were reviewed by me and considered in my medical decision making (see chart for details).    Contusion of left foot, elevated blood pressure reading.  X-ray negative for acute bony abnormality.  Patient declines postop shoe.  Toes buddy taped here and instructed patient to continue buddy taping for comfort.  Discussed rest, elevation, ice packs, ibuprofen  as needed.  Instructed her to follow-up with a podiatrist if her symptoms are not improving.  Contact information for on-call podiatrist provided.  Education  provided on foot contusion.  Discussed with patient that her blood pressure is elevated today.  I rechecked this manually and it is accurate.  Education provided on preventing hypertension.  Instructed patient to have this rechecked by her PCP tomorrow; patient plans to go to her doctor's office this afternoon.  ED precautions given.  She agrees to plan of care.  Final Clinical Impressions(s) / UC Diagnoses   Final diagnoses:  Contusion of left foot, initial encounter  Elevated blood pressure reading     Discharge Instructions      Take Tylenol  or ibuprofen  as directed.  Rest and elevate your foot.  Apply ice packs as directed.  Follow up with a podiatrist such as the one listed below.    Your blood pressure is elevated today at 162/77; recheck 179/73.  Please have this rechecked by your primary care provider tomorrow.          ED Prescriptions   None    PDMP not reviewed this encounter.   Corlis Burnard DEL, NP 07/24/24 1135

## 2024-07-24 NOTE — ED Triage Notes (Signed)
 Patient to Urgent Care with complaints of left sided, 4th and fifth toe pain.   Symptoms started yesterday after she kicked her grandchild's toy twice.

## 2024-07-24 NOTE — Telephone Encounter (Signed)
 FYI Only or Action Required?: Action required by provider: request for appointment. Pt seen at [facility] for toe and told by NP there to be seen today. Pt has appt for tomorrow morning.  Patient was last seen in primary care on 06/04/2024 by Corwin Antu, FNP.  Called Nurse Triage reporting Hypertension.  Symptoms began today.  Interventions attempted: Nothing.  Symptoms are: stable.  Triage Disposition: See PCP When Office is Open (Within 3 Days)  Patient/caregiver understands and will follow disposition?: Yes                 Copied from CRM #[id number]. Topic: Clinical - Red Word Triage >> Jul 24, 2024 11:03 AM Harlene ORN wrote: Red Word that prompted transfer to Nurse Triage: High BP Latest reading: 179 over 73 Reason for Disposition  Systolic BP >= 160 OR Diastolic >= 100  Answer Assessment - Initial Assessment Questions Pt was seen today at [facility] for toe issue. While there pt had elevated Bps  2 readings taken. Pt has had white coat syndrome in the past . NP at UC advised pt to be seen today at PCP.  No appts available. Pt took appt for tomorrow morning. Pt will record Bps today and will call back if bp is above 180/110 ot if other s/s develop.  1. BLOOD PRESSURE: What is your blood pressure? Did you take at least two measurements 5 minutes apart?     179/73 - A few minutes ago 162/77. 2. ONSET: When did you take your blood pressure?     A few minutes ago 3. HOW: How did you take your blood pressure? (e.g., automatic home BP monitor, visiting nurse)     Machine and manual 4. HISTORY: Do you have a history of high blood pressure?     no 5. MEDICINES: Are you taking any medicines for blood pressure? Have you missed any doses recently?     no 6. OTHER SYMPTOMS: Do you have any symptoms? (e.g., blurred vision, chest pain, difficulty breathing, headache, weakness)     No - anxious about bp  Protocols used: Blood Pressure - High-A-AH

## 2024-07-24 NOTE — Telephone Encounter (Signed)
 Appointment with Dr. Avelina 07/25/24

## 2024-07-25 ENCOUNTER — Ambulatory Visit (HOSPITAL_COMMUNITY): Payer: Self-pay

## 2024-07-25 ENCOUNTER — Ambulatory Visit: Admitting: Family Medicine

## 2024-07-25 ENCOUNTER — Ambulatory Visit: Payer: Self-pay | Admitting: Family Medicine

## 2024-07-25 VITALS — BP 132/80 | HR 74 | Temp 98.3°F | Ht 65.0 in | Wt 181.5 lb

## 2024-07-25 DIAGNOSIS — I10 Essential (primary) hypertension: Secondary | ICD-10-CM | POA: Insufficient documentation

## 2024-07-25 DIAGNOSIS — R6889 Other general symptoms and signs: Secondary | ICD-10-CM | POA: Diagnosis not present

## 2024-07-25 DIAGNOSIS — R030 Elevated blood-pressure reading, without diagnosis of hypertension: Secondary | ICD-10-CM | POA: Diagnosis not present

## 2024-07-25 LAB — TSH: TSH: 1.51 u[IU]/mL (ref 0.35–5.50)

## 2024-07-25 NOTE — Assessment & Plan Note (Signed)
 Acute, 1 elevated blood pressure measurement at urgent care for foot pain but home and blood pressures here high normal.  Elevation likely due to several cups of coffee prior to urgent care visit as well as possibly pain contributing. She is also on estrogen antagonist which can contribute to hypertension.  Will evaluate with thyroid  function test given this has not been done in a year and could contribute as a secondary cause.  Recent c-Met within normal range.  She will continue to follow her blood pressure at home and will call/MyChart with measurements in the next 2 weeks or earlier if it is above 140/90 for initiation of medication.    We did discuss considering starting her on a blood pressure medication such as HCTZ but she feels like she already runs very dry and does not want to use a diuretic.  If medication is needed consider losartan or amlodipine.

## 2024-07-25 NOTE — Progress Notes (Signed)
 Patient ID: Lisa Gallegos, female    DOB: 25-Apr-1956, 68 y.o.   MRN: [id number]  This visit was conducted in person.  BP 132/80   Pulse 74   Temp 98.3 F (36.8 C) (Temporal)   Ht 5' 5 (1.651 m)   Wt 181 lb 8 oz (82.3 kg)   LMP 02/17/2012   SpO2 99%   BMI 30.20 kg/m    CC:  Chief Complaint  Patient presents with   Hypertension    Pt complains of recent high bp as 170/79 to 160/75. This morning was normal.     Subjective:   HPI: Lisa Gallegos is a 68 y.o. female presenting on 07/25/2024 for Hypertension (Pt complains of recent high bp as 170/79 to 160/75. This morning was normal. )   BP elevated at urgent Care yesterday ( seen for  contusion left foot) Blood pressure in office today is  high normal. Did have 2-3 cups of coffee prior to urgent care.  She denies chest pain, shortness of breath, no vision change. No swelling.  Sicne then BP at home 129-131/ 71-81 She is a non-smoker.   No previous diagnosis of hypertension... has had some white coat HTN. No recent medication changes other than new lovastatin . She is on anastrozole  for breast cancer treatment she has been on this since November 2024  Diet: heart healthy diet. Exercise: 45 min 3 times a week of strength training Family history: Dad with CAd, no sure if high blood pressure. She has gained a little bit of weight approximately 6 pounds in the last 5 months. Wt Readings from Last 3 Encounters:  07/25/24 181 lb 8 oz (82.3 kg)  06/04/24 178 lb (80.7 kg)  01/09/24 175 lb (79.4 kg)   BP Readings from Last 3 Encounters:  07/25/24 132/80  07/24/24 (!) 179/73  06/04/24 132/80   Nml CMET  8/202/2025 GFR 76, TSH 04/2023 1.2      Relevant past medical, surgical, family and social history reviewed and updated as indicated. Interim medical history since our last visit reviewed. Allergies and medications reviewed and updated. Outpatient Medications Prior to Visit  Medication Sig Dispense Refill   anastrozole   (ARIMIDEX ) 1 MG tablet TAKE 1 TABLET BY MOUTH EVERY DAY 90 tablet 3   Cholecalciferol (VITAMIN D3) 125 MCG (5000 UT) TABS Take 5,000 Units by mouth daily in the afternoon.     Coenzyme Q10 (CO Q 10 PO) Take 200 mg by mouth daily in the afternoon.     Cyanocobalamin  (VITAMIN B 12 PO) Take 500 mcg by mouth daily in the afternoon.     lovastatin  (MEVACOR ) 20 MG tablet TAKE 1 TABLET BY MOUTH EVERYDAY AT BEDTIME 90 tablet 1   Magnesium 400 MG TABS Take 400 mg by mouth daily in the afternoon.     Omega-3 1000 MG CAPS Take by mouth. Algae omega     OVER THE COUNTER MEDICATION Take 150 mg by mouth. Annatto-E     RA TURMERIC EXTRA STRENGTH PO Take 500 mg by mouth daily.     No facility-administered medications prior to visit.     Per HPI unless specifically indicated in ROS section below Review of Systems  Constitutional:  Negative for fatigue and fever.  HENT:  Negative for congestion.   Eyes:  Negative for pain.  Respiratory:  Negative for cough and shortness of breath.   Cardiovascular:  Negative for chest pain, palpitations and leg swelling.  Gastrointestinal:  Negative for abdominal pain.  Genitourinary:  Negative for dysuria and vaginal bleeding.  Musculoskeletal:  Negative for back pain.  Neurological:  Negative for syncope, light-headedness and headaches.  Psychiatric/Behavioral:  Negative for dysphoric mood.    Objective:  BP 132/80   Pulse 74   Temp 98.3 F (36.8 C) (Temporal)   Ht 5' 5 (1.651 m)   Wt 181 lb 8 oz (82.3 kg)   LMP 02/17/2012   SpO2 99%   BMI 30.20 kg/m   Wt Readings from Last 3 Encounters:  07/25/24 181 lb 8 oz (82.3 kg)  06/04/24 178 lb (80.7 kg)  01/09/24 175 lb (79.4 kg)      Physical Exam Constitutional:      General: She is not in acute distress.    Appearance: Normal appearance. She is well-developed. She is not ill-appearing or toxic-appearing.  HENT:     Head: Normocephalic.     Right Ear: Hearing, tympanic membrane, ear canal and external ear  normal. Tympanic membrane is not erythematous, retracted or bulging.     Left Ear: Hearing, tympanic membrane, ear canal and external ear normal. Tympanic membrane is not erythematous, retracted or bulging.     Nose: No mucosal edema or rhinorrhea.     Right Sinus: No maxillary sinus tenderness or frontal sinus tenderness.     Left Sinus: No maxillary sinus tenderness or frontal sinus tenderness.     Mouth/Throat:     Pharynx: Uvula midline.  Eyes:     General: Lids are normal. Lids are everted, no foreign bodies appreciated.     Conjunctiva/sclera: Conjunctivae normal.     Pupils: Pupils are equal, round, and reactive to light.  Neck:     Thyroid : No thyroid  mass or thyromegaly.     Vascular: No carotid bruit.     Trachea: Trachea normal.  Cardiovascular:     Rate and Rhythm: Normal rate and regular rhythm.     Pulses: Normal pulses.     Heart sounds: Normal heart sounds, S1 normal and S2 normal. No murmur heard.    No friction rub. No gallop.  Pulmonary:     Effort: Pulmonary effort is normal. No tachypnea or respiratory distress.     Breath sounds: Normal breath sounds. No decreased breath sounds, wheezing, rhonchi or rales.  Abdominal:     General: Bowel sounds are normal.     Palpations: Abdomen is soft.     Tenderness: There is no abdominal tenderness.  Musculoskeletal:     Cervical back: Normal range of motion and neck supple.  Skin:    General: Skin is warm and dry.     Findings: No rash.  Neurological:     Mental Status: She is alert.  Psychiatric:        Mood and Affect: Mood is not anxious or depressed.        Speech: Speech normal.        Behavior: Behavior normal. Behavior is cooperative.        Thought Content: Thought content normal.        Judgment: Judgment normal.       Results for orders placed or performed in visit on 06/04/24  Lipid panel   Collection Time: 06/04/24 11:14 AM  Result Value Ref Range   Cholesterol 256 (H) 0 - 200 mg/dL    Triglycerides 822.9 (H) 0.0 - 149.0 mg/dL   HDL 49.99 >60.99 mg/dL   VLDL 64.5 0.0 - 59.9 mg/dL   LDL Cholesterol 829 (H) 0 - 99 mg/dL  Total CHOL/HDL Ratio 5    NonHDL 205.89   Comprehensive metabolic panel with GFR   Collection Time: 06/04/24 11:14 AM  Result Value Ref Range   Sodium 139 135 - 145 mEq/L   Potassium 3.9 3.5 - 5.1 mEq/L   Chloride 102 96 - 112 mEq/L   CO2 29 19 - 32 mEq/L   Glucose, Bld 97 70 - 99 mg/dL   BUN 28 (H) 6 - 23 mg/dL   Creatinine, Ser 9.19 0.40 - 1.20 mg/dL   Total Bilirubin 0.5 0.2 - 1.2 mg/dL   Alkaline Phosphatase 92 39 - 117 U/L   AST 20 0 - 37 U/L   ALT 18 0 - 35 U/L   Total Protein 7.4 6.0 - 8.3 g/dL   Albumin 4.3 3.5 - 5.2 g/dL   GFR 23.27 >39.99 mL/min   Calcium  9.4 8.4 - 10.5 mg/dL  VITAMIN D  25 Hydroxy (Vit-D Deficiency, Fractures)   Collection Time: 06/04/24 11:14 AM  Result Value Ref Range   VITD 41.37 30.00 - 100.00 ng/mL    Assessment and Plan  Elevated blood pressure reading Assessment & Plan: Acute, 1 elevated blood pressure measurement at urgent care for foot pain but home and blood pressures here high normal.  Elevation likely due to several cups of coffee prior to urgent care visit as well as possibly pain contributing. She is also on estrogen antagonist which can contribute to hypertension.  Will evaluate with thyroid  function test given this has not been done in a year and could contribute as a secondary cause.  Recent c-Met within normal range.  She will continue to follow her blood pressure at home and will call/MyChart with measurements in the next 2 weeks or earlier if it is above 140/90 for initiation of medication.    We did discuss considering starting her on a blood pressure medication such as HCTZ but she feels like she already runs very dry and does not want to use a diuretic.  If medication is needed consider losartan or amlodipine.  Orders: -     TSH  Blood pressure alteration -     TSH    No follow-ups  on file.   Greig Ring, MD

## 2024-07-25 NOTE — Assessment & Plan Note (Signed)
 SABRA

## 2024-08-26 NOTE — Assessment & Plan Note (Signed)
 12/23/2020: Left lumpectomy Viktoria): IDC, grade 1, 0.6cm, clear margins, 3 left axillary lymph nodes negative for carcinoma.HER-2 equivocal by IHC, negative by FISH (ratio 1.26), ER+ >95%, PR+ 40%, Ki67 5%   Recommendation: 1. Adjuvant radiation therapy 01/25/21- 02/18/21 2. Adjuvant antiestrogen therapy with anastrozole  started 03/16/2021   Anastrozole  toxicities: Joint stiffness especially in the hands in the morning.  Gets better through the day.      Breast cancer surveillance: 1.  Breast exam 08/26/2024: Benign 2. mammogram 12/12/2023: Benign breast density category C   Bone density 04/02/2023: T-score -0.8: Normal Return to clinic in 1 year for follow-up

## 2024-08-27 ENCOUNTER — Inpatient Hospital Stay: Payer: Medicare Other | Attending: Hematology and Oncology | Admitting: Hematology and Oncology

## 2024-08-27 VITALS — BP 140/80 | HR 69 | Temp 98.7°F | Resp 18 | Ht 65.0 in | Wt 182.7 lb

## 2024-08-27 DIAGNOSIS — Z170 Estrogen receptor positive status [ER+]: Secondary | ICD-10-CM | POA: Diagnosis not present

## 2024-08-27 DIAGNOSIS — Z17411 Hormone receptor positive with human epidermal growth factor receptor 2 negative status: Secondary | ICD-10-CM | POA: Diagnosis not present

## 2024-08-27 DIAGNOSIS — Z79899 Other long term (current) drug therapy: Secondary | ICD-10-CM | POA: Insufficient documentation

## 2024-08-27 DIAGNOSIS — Z1721 Progesterone receptor positive status: Secondary | ICD-10-CM | POA: Insufficient documentation

## 2024-08-27 DIAGNOSIS — Z79811 Long term (current) use of aromatase inhibitors: Secondary | ICD-10-CM | POA: Insufficient documentation

## 2024-08-27 DIAGNOSIS — C50412 Malignant neoplasm of upper-outer quadrant of left female breast: Secondary | ICD-10-CM | POA: Diagnosis present

## 2024-08-27 DIAGNOSIS — Z923 Personal history of irradiation: Secondary | ICD-10-CM | POA: Insufficient documentation

## 2024-08-27 MED ORDER — ANASTROZOLE 1 MG PO TABS: 1.0000 mg | ORAL_TABLET | Freq: Every day | ORAL | 3 refills | Status: DC

## 2024-08-27 NOTE — Progress Notes (Signed)
 Patient Care Team: Corwin Antu, FNP as PCP - General (Family Medicine) Dewey Rush, MD as Consulting Physician (Radiation Oncology) Ebbie Cough, MD as Consulting Physician (General Surgery) Odean Potts, MD as Consulting Physician (Hematology and Oncology) Livingston Rigg, MD as Consulting Physician (Dermatology) O'Neal, Darryle Ned, MD as Consulting Physician (Cardiology) [facility], [facility]  DIAGNOSIS:  Encounter Diagnosis  Name Primary?   Malignant neoplasm of upper-outer quadrant of left breast in female, estrogen receptor positive (HCC) Yes    SUMMARY OF ONCOLOGIC HISTORY: Oncology History  Malignant neoplasm of upper-outer quadrant of left breast in female, estrogen receptor positive (HCC)  12/03/2020 Initial Diagnosis   Screening mammogram showed a left breast mass. Diagnostic mammogram and US  showed a 0.5cm mass at the 12 o'clock position. Biopsy showed IDC, grade 2, HER-2 equivocal by IHC, negative by FISH (ratio 1.26), ER+ >95%, PR+ 40%, Ki67 5%.   12/15/2020 Cancer Staging   Staging form: Breast, AJCC 8th Edition - Clinical stage from 12/15/2020: Stage IA (cT1a, cN0, cM0, G2, ER+, PR+, HER2-) - Signed by Odean Potts, MD on 12/15/2020 Stage prefix: Initial diagnosis Laterality: Left Staged by: Pathologist and managing physician Stage used in treatment planning: Yes National guidelines used in treatment planning: Yes Type of national guideline used in treatment planning: NCCN   12/23/2020 Surgery   Left lumpectomy Viktoria): IDC, grade 1, 0.6cm, clear margins, 3 left axillary lymph nodes negative for carcinoma.   01/24/2021 - 02/18/2021 Radiation Therapy   Adjuvant radiation  01/24/2021 through 02/18/2021 Site Technique Total Dose (Gy) Dose per Fx (Gy) Completed Fx Beam Energies  Breast, Left: Breast_Lt 3D 42.56/42.56 2.66 16/16 6X  Breast, Left: Breast_Lt_Bst 3D 8/8 2 4/4 6X     03/2021 -  Anti-estrogen oral therapy   Anastrozole  daily      CHIEF COMPLIANT: Surveillance of breast cancer on anastrozole  therapy  HISTORY OF PRESENT ILLNESS: History of Present Illness Lisa Gallegos is a 68 year old female who presents for follow-up regarding joint stiffness and weight management.  She experiences joint stiffness and achiness, which has improved significantly with strength training three days a week. Her exercise routine includes circuit training focusing on leg, arm, and cardio workouts.  She reports tenderness in her chest, persisting years after treatment for estrogen receptor-positive breast cancer in the upper-outer quadrant of the left breast. Her grandson sometimes causes discomfort by touching the area, but otherwise, she feels fine.     ALLERGIES:  is allergic to atorvastatin  and rosuvastatin .  MEDICATIONS:  Current Outpatient Medications  Medication Sig Dispense Refill   Cholecalciferol (VITAMIN D3) 125 MCG (5000 UT) TABS Take 5,000 Units by mouth daily in the afternoon.     Coenzyme Q10 (CO Q 10 PO) Take 200 mg by mouth daily in the afternoon.     lovastatin  (MEVACOR ) 20 MG tablet TAKE 1 TABLET BY MOUTH EVERYDAY AT BEDTIME 90 tablet 1   Magnesium 400 MG TABS Take 400 mg by mouth daily in the afternoon.     Omega-3 1000 MG CAPS Take by mouth. Algae omega     OVER THE COUNTER MEDICATION Take 150 mg by mouth. Annatto-E     RA TURMERIC EXTRA STRENGTH PO Take 500 mg by mouth daily.     anastrozole  (ARIMIDEX ) 1 MG tablet Take 1 tablet (1 mg total) by mouth daily. 90 tablet 3   No current facility-administered medications for this visit.    PHYSICAL EXAMINATION: ECOG PERFORMANCE STATUS: 1 - Symptomatic but completely ambulatory  Vitals:  08/27/24 0916  BP: (!) 140/80  Pulse: 69  Resp: 18  Temp: 98.7 F (37.1 C)  SpO2: 99%   Filed Weights   08/27/24 0916  Weight: 182 lb 11.2 oz (82.9 kg)    Physical Exam Breast exam: No palpable lumps or nodules of concern  (exam performed in the presence of a  chaperone)  LABORATORY DATA:  I have reviewed the data as listed    Latest Ref Rng & Units 06/04/2024   11:14 AM 05/15/2023   10:58 AM 03/19/2023   11:53 AM  CMP  Glucose 70 - 99 mg/dL 97  87  94   BUN 6 - 23 mg/dL 28  17  17    Creatinine 0.40 - 1.20 mg/dL 9.19  9.12  9.11   Sodium 135 - 145 mEq/L 139  139  141   Potassium 3.5 - 5.1 mEq/L 3.9  4.4  4.6   Chloride 96 - 112 mEq/L 102  101  102   CO2 19 - 32 mEq/L 29  23  30    Calcium  8.4 - 10.5 mg/dL 9.4  9.6  9.6   Total Protein 6.0 - 8.3 g/dL 7.4  7.3  7.5   Total Bilirubin 0.2 - 1.2 mg/dL 0.5  0.4  0.4   Alkaline Phos 39 - 117 U/L 92  145  119   AST 0 - 37 U/L 20  22  21    ALT 0 - 35 U/L 18  21  23      Lab Results  Component Value Date   WBC 6.9 05/15/2023   HGB 14.5 05/15/2023   HCT 43.4 05/15/2023   MCV 88 05/15/2023   PLT 237 05/15/2023   NEUTROABS 3.6 05/15/2023    ASSESSMENT & PLAN:  Malignant neoplasm of upper-outer quadrant of left breast in female, 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/23/2020: Left lumpectomy Viktoria): IDC, grade 1, 0.6cm, clear margins, 3 left axillary lymph nodes negative for carcinoma.HER-2 equivocal by IHC, negative by FISH (ratio 1.26), ER+ >95%, PR+ 40%, Ki67 5%   Recommendation: 1. Adjuvant radiation therapy 01/25/21- 02/18/21 2. Adjuvant antiestrogen therapy with anastrozole  started 03/16/2021   Anastrozole  toxicities: Denies any complaints since she has been doing strength training.      Breast cancer surveillance: 1.  Breast exam 08/26/2024: Benign 2. mammogram 12/12/2023: Benign breast density category C   Bone density 04/02/2023: T-score -0.8: Normal Return to clinic in 1 year for follow-up She does strength training 3 times a week and stays fairly active.   No orders of the defined types were placed in this encounter.  The patient has a good understanding of the overall plan. she agrees with it. she will call with any problems that may develop before the next visit here.  I  personally spent a total of 30 minutes in the care of the patient today including preparing to see the patient, getting/reviewing separately obtained history, performing a medically appropriate exam/evaluation, counseling and educating, placing orders, referring and communicating with other health care professionals, documenting clinical information in the EHR, independently interpreting results, communicating results, and coordinating care.   Viinay K Maudie Shingledecker, MD 08/27/24

## 2024-10-27 ENCOUNTER — Ambulatory Visit

## 2024-11-05 ENCOUNTER — Encounter: Payer: Self-pay | Admitting: Family

## 2024-11-06 ENCOUNTER — Other Ambulatory Visit: Payer: Self-pay | Admitting: Family

## 2024-11-06 DIAGNOSIS — Z853 Personal history of malignant neoplasm of breast: Secondary | ICD-10-CM

## 2024-11-07 ENCOUNTER — Other Ambulatory Visit: Payer: Self-pay | Admitting: Hematology and Oncology

## 2024-11-10 ENCOUNTER — Ambulatory Visit

## 2024-11-18 ENCOUNTER — Ambulatory Visit

## 2024-11-18 VITALS — Wt 185.4 lb

## 2024-11-18 DIAGNOSIS — Z483 Aftercare following surgery for neoplasm: Secondary | ICD-10-CM

## 2024-11-18 NOTE — Therapy (Signed)
 " OUTPATIENT PHYSICAL THERAPY SOZO SCREENING NOTE   Patient Name: Lisa Gallegos MRN: [id number] DOB:1956/03/25, 69 y.o., female Today's Date: 11/18/2024  PCP: Corwin Antu, FNP REFERRING PROVIDER: Ebbie Cough, MD   PT End of Session - 11/18/24 [phone]     Visit Number 4   # unchanged due to screen only   PT Start Time 0857    PT Stop Time 0903    PT Time Calculation (min) 6 min    Activity Tolerance Patient tolerated treatment well    Behavior During Therapy [facility] for tasks assessed/performed          Past Medical History:  Diagnosis Date   Anemia    Anxiety    Cancer (HCC) 11/2020   left breast IDC   Family history of pancreatic cancer 12/16/2020   Hyperlipidemia    Joint pain    Malignant neoplasm of upper-outer quadrant of left breast in female, 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/03/2020   Personal history of radiation therapy    Vitamin D  deficiency    Past Surgical History:  Procedure Laterality Date   BREAST LUMPECTOMY WITH RADIOACTIVE SEED AND AXILLARY LYMPH NODE DISSECTION Left 12/23/2020   Procedure: LEFT BREAST LUMPECTOMY WITH RADIOACTIVE SEED AND LEFT AXILLARY LYMPH NODE BIOPSY;  Surgeon: Ebbie Cough, MD;  Location: [facility];  Service: General;  Laterality: Left;  PEC BLOCK; START TIME OF 11:30 AM FOR 60 MINUTES ROOM 2 [place] [facility]   BREAST SURGERY     CESAREAN SECTION  1985   FTP   COLONOSCOPY WITH PROPOFOL  N/A 12/28/2022   Procedure: COLONOSCOPY WITH PROPOFOL ;  Surgeon: Unk Corinn Skiff, MD;  Location: [facility];  Service: Gastroenterology;  Laterality: N/A;   KIDNEY STONE SURGERY     laser surgery   LAPAROSCOPIC APPENDECTOMY N/A 05/10/2018   Procedure: APPENDECTOMY LAPAROSCOPIC;  Surgeon: Mikell Katz, MD;  Location: [facility] OR;  Service: General;  Laterality: N/A;   Patient Active Problem List   Diagnosis Date Noted   Insulin  resistance - new onset 06/21/2023   Generalized obesity- Starting BMI 30.26 06/21/2023    BMI 28.0-28.9,adult Current BMI 28.8 06/21/2023   History of colon polyps 03/19/2023   Elevated LDL cholesterol level 03/19/2023   Arthralgia of both hands 03/07/2022   History of breast cancer 03/06/2022   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 03/06/2022   Vitamin B12 deficiency 03/06/2022   On statin therapy 03/06/2022   Malignant neoplasm of upper-outer quadrant of left breast in female, 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/03/2020   Situational anxiety 04/05/2018   Vitamin D  deficiency 04/05/2018   Hyperlipidemia 03/24/2015   Elevated blood pressure reading 09/15/2014    REFERRING DIAG: left breast cancer at risk for lymphedema  THERAPY DIAG: Aftercare following surgery for neoplasm  PERTINENT HISTORY: Patient was diagnosed on 11/15/2020 with left grade II invasive ductal carcinoma breast cancer. Patient underwent a left lumpectomy and sentinel node biopsy (3 negative nodes) on 12/23/2020. It is ER/PR positive and HER2 negative with a Ki67 of 5%.   PRECAUTIONS: left UE Lymphedema risk, None  SUBJECTIVE: Pt returns for her last 6 month L-Dex screen.   PAIN:  Are you having pain? No  SOZO SCREENING: Patient was assessed today using the SOZO machine to determine the lymphedema index score. This was compared to her baseline score. It was determined that she is within the recommended range when compared to her baseline and no further action is needed at this time. She will continue SOZO screenings.  These are done every 3 months for 2 years post operatively followed by every 6 months for 2 years, and then annually.   L-DEX FLOWSHEETS - 11/18/24 0900       L-DEX LYMPHEDEMA SCREENING   Measurement Type Unilateral    L-DEX MEASUREMENT EXTREMITY Upper Extremity    POSITION  Standing    DOMINANT SIDE Right    At Risk Side Left    BASELINE SCORE (UNILATERAL) 2.2    L-DEX SCORE (UNILATERAL) 0.8    VALUE CHANGE (UNILAT) -1.4         P: Pt to transition annual  SOZO's now.   Aden Berwyn Caldron, PTA 11/18/2024, 9:03 AM    "

## 2024-11-24 ENCOUNTER — Ambulatory Visit: Payer: Medicare Other

## 2024-12-15 ENCOUNTER — Encounter

## 2025-08-27 ENCOUNTER — Inpatient Hospital Stay: Admitting: Hematology and Oncology

## 2025-11-23 ENCOUNTER — Ambulatory Visit
